# Patient Record
Sex: Female | Born: 1970 | Race: Black or African American | Hispanic: No | Marital: Single | Smoking: Former smoker
Health system: Southern US, Community
[De-identification: ages and names within clinical notes are randomized; demographics above are authoritative.]

## PROBLEM LIST (undated history)

## (undated) DIAGNOSIS — T7840XA Allergy, unspecified, initial encounter: Secondary | ICD-10-CM

## (undated) DIAGNOSIS — Z720 Tobacco use: Secondary | ICD-10-CM

## (undated) DIAGNOSIS — I10 Essential (primary) hypertension: Secondary | ICD-10-CM

## (undated) DIAGNOSIS — D573 Sickle-cell trait: Secondary | ICD-10-CM

## (undated) HISTORY — DX: Allergy, unspecified, initial encounter: T78.40XA

## (undated) HISTORY — DX: Sickle-cell trait: D57.3

## (undated) HISTORY — PX: HERNIA REPAIR: SHX51

## (undated) HISTORY — PX: TUBAL LIGATION: SHX77

---

## 1999-01-15 ENCOUNTER — Encounter: Payer: Self-pay | Admitting: Emergency Medicine

## 1999-01-15 ENCOUNTER — Emergency Department (HOSPITAL_COMMUNITY): Admission: EM | Admit: 1999-01-15 | Discharge: 1999-01-15 | Payer: Self-pay | Admitting: Emergency Medicine

## 1999-02-08 ENCOUNTER — Ambulatory Visit (HOSPITAL_COMMUNITY): Admission: RE | Admit: 1999-02-08 | Discharge: 1999-02-08 | Payer: Self-pay | Admitting: *Deleted

## 1999-04-20 ENCOUNTER — Emergency Department (HOSPITAL_COMMUNITY): Admission: EM | Admit: 1999-04-20 | Discharge: 1999-04-20 | Payer: Self-pay | Admitting: Emergency Medicine

## 1999-06-05 ENCOUNTER — Encounter: Payer: Self-pay | Admitting: *Deleted

## 1999-06-05 ENCOUNTER — Encounter (HOSPITAL_COMMUNITY): Admission: RE | Admit: 1999-06-05 | Discharge: 1999-06-12 | Payer: Self-pay | Admitting: *Deleted

## 1999-06-10 ENCOUNTER — Inpatient Hospital Stay (HOSPITAL_COMMUNITY): Admission: AD | Admit: 1999-06-10 | Discharge: 1999-06-12 | Payer: Self-pay | Admitting: *Deleted

## 1999-07-28 ENCOUNTER — Encounter: Admission: RE | Admit: 1999-07-28 | Discharge: 1999-07-28 | Payer: Self-pay | Admitting: Obstetrics & Gynecology

## 2000-07-05 ENCOUNTER — Emergency Department (HOSPITAL_COMMUNITY): Admission: EM | Admit: 2000-07-05 | Discharge: 2000-07-05 | Payer: Self-pay | Admitting: *Deleted

## 2000-07-08 ENCOUNTER — Emergency Department (HOSPITAL_COMMUNITY): Admission: EM | Admit: 2000-07-08 | Discharge: 2000-07-08 | Payer: Self-pay | Admitting: Emergency Medicine

## 2000-12-03 ENCOUNTER — Emergency Department (HOSPITAL_COMMUNITY): Admission: EM | Admit: 2000-12-03 | Discharge: 2000-12-03 | Payer: Self-pay | Admitting: Emergency Medicine

## 2001-09-10 ENCOUNTER — Emergency Department (HOSPITAL_COMMUNITY): Admission: EM | Admit: 2001-09-10 | Discharge: 2001-09-10 | Payer: Self-pay | Admitting: Emergency Medicine

## 2001-09-10 ENCOUNTER — Encounter: Payer: Self-pay | Admitting: Emergency Medicine

## 2001-11-23 ENCOUNTER — Emergency Department (HOSPITAL_COMMUNITY): Admission: EM | Admit: 2001-11-23 | Discharge: 2001-11-23 | Payer: Self-pay | Admitting: Emergency Medicine

## 2002-01-23 ENCOUNTER — Ambulatory Visit (HOSPITAL_BASED_OUTPATIENT_CLINIC_OR_DEPARTMENT_OTHER): Admission: RE | Admit: 2002-01-23 | Discharge: 2002-01-23 | Payer: Self-pay | Admitting: Obstetrics and Gynecology

## 2003-01-12 ENCOUNTER — Emergency Department (HOSPITAL_COMMUNITY): Admission: EM | Admit: 2003-01-12 | Discharge: 2003-01-12 | Payer: Self-pay | Admitting: Emergency Medicine

## 2003-01-12 ENCOUNTER — Encounter: Payer: Self-pay | Admitting: Emergency Medicine

## 2005-02-02 ENCOUNTER — Ambulatory Visit (HOSPITAL_COMMUNITY): Admission: RE | Admit: 2005-02-02 | Discharge: 2005-02-02 | Payer: Self-pay | Admitting: *Deleted

## 2009-07-09 HISTORY — PX: BREAST BIOPSY: SHX20

## 2009-12-21 ENCOUNTER — Emergency Department (HOSPITAL_COMMUNITY): Admission: EM | Admit: 2009-12-21 | Discharge: 2009-12-21 | Payer: Self-pay | Admitting: Emergency Medicine

## 2010-01-20 ENCOUNTER — Ambulatory Visit (HOSPITAL_COMMUNITY): Admission: RE | Admit: 2010-01-20 | Discharge: 2010-01-20 | Payer: Self-pay | Admitting: General Surgery

## 2010-07-30 ENCOUNTER — Encounter: Payer: Self-pay | Admitting: Internal Medicine

## 2010-09-23 LAB — DIFFERENTIAL
Basophils Absolute: 0 10*3/uL (ref 0.0–0.1)
Eosinophils Absolute: 0.2 10*3/uL (ref 0.0–0.7)
Lymphocytes Relative: 30 % (ref 12–46)
Monocytes Absolute: 0.7 10*3/uL (ref 0.1–1.0)

## 2010-09-23 LAB — CBC
Hemoglobin: 12.6 g/dL (ref 12.0–15.0)
MCHC: 33.4 g/dL (ref 30.0–36.0)
MCV: 82.6 fL (ref 78.0–100.0)

## 2010-09-23 LAB — SURGICAL PCR SCREEN
MRSA, PCR: NEGATIVE
Staphylococcus aureus: POSITIVE — AB

## 2010-11-24 NOTE — Op Note (Signed)
[facility]  Patient:    Yolanda Jennings, Yolanda Jennings Visit Number: [id number] MRN: [id number]          Service Type: NES Location: [facility] Attending Physician:  Michaele Offer Dictated by:   Zenaida Niece, M.D. Proc. Date: 01/23/02 Admit Date:  01/23/2002 Discharge Date: 01/23/2002                             Operative Report  PREOPERATIVE DIAGNOSIS:  Desires surgical sterility.  POSTOPERATIVE DIAGNOSIS:  Desires surgical sterility.  PROCEDURE:  Laparoscopic bilateral tubal fulguration.  SURGEON:  Zenaida Niece, M.D.  ANESTHESIA:  General with an LMA.  ESTIMATED BLOOD LOSS:  Less than 50 cc.  FINDINGS:  Normal female anatomy with a slightly enlarged uterus.  DESCRIPTION OF PROCEDURE:  The patient was taken to the operating room and placed in the dorsal supine position. General anesthesia was induced with an LMA and she was placed in mobile stirrups. The abdomen was prepped and draped in the usual sterile fashion, bladder drained with a red rubber catheter, Hulka tenaculum applied to her cervix for uterine manipulation. Infraumbilical skin was then infiltrated with 0.5% Marcaine. A 1.5 cm horizontal incision was made and the 10/11 disposable trocar was introduced. Placement was confirmed by the laparoscope after CO2 gas was insufflated with an opening pressure of 3 mmHg. Inspection via the operating scope revealed a slightly enlarged but homogeneous uterus. Both tubes and ovaries appeared normal. Both fallopian tubes were identified and traced to their fimbriated ends. Three segments of the middle portion of each tube were desiccated with bipolar cautery until the amp meter read zero. This achieved adequate tubal fulguration. All sites were hemostatic. The scope was removed and all gas allowed to deflate from the abdomen. The umbilical trocar was removed and fascia was reapproximated with a figure-of-eight suture of #0 Vicryl. The  skin was closed with interrupted subcuticular sutures of 4-0 Vicryl followed by Steri-Strips and Band-Aid. The Hulka tenaculum was removed from her cervix. The patient tolerated the procedure well and was extubated in the operating room and taken to the recovery room in stable condition. Dictated by:   Zenaida Niece, M.D. Attending Physician:  Michaele Offer DD:  01/23/02 TD:  01/28/02 Job: [phone] [id number]

## 2011-07-10 HISTORY — PX: HERNIA REPAIR: SHX51

## 2013-03-10 ENCOUNTER — Ambulatory Visit: Payer: Self-pay | Admitting: Certified Nurse Midwife

## 2013-03-10 ENCOUNTER — Encounter: Payer: Self-pay | Admitting: Certified Nurse Midwife

## 2013-08-24 ENCOUNTER — Other Ambulatory Visit: Payer: Self-pay

## 2013-08-24 ENCOUNTER — Emergency Department (HOSPITAL_COMMUNITY)
Admission: EM | Admit: 2013-08-24 | Discharge: 2013-08-24 | Disposition: A | Discharge status: Home / Self Care | Payer: Managed Care, Other (non HMO) | Attending: Emergency Medicine | Admitting: Emergency Medicine

## 2013-08-24 ENCOUNTER — Encounter (HOSPITAL_COMMUNITY): Payer: Self-pay | Admitting: Emergency Medicine

## 2013-08-24 DIAGNOSIS — Z862 Personal history of diseases of the blood and blood-forming organs and certain disorders involving the immune mechanism: Secondary | ICD-10-CM | POA: Insufficient documentation

## 2013-08-24 DIAGNOSIS — I10 Essential (primary) hypertension: Secondary | ICD-10-CM | POA: Insufficient documentation

## 2013-08-24 DIAGNOSIS — Z79899 Other long term (current) drug therapy: Secondary | ICD-10-CM | POA: Insufficient documentation

## 2013-08-24 DIAGNOSIS — H538 Other visual disturbances: Secondary | ICD-10-CM | POA: Insufficient documentation

## 2013-08-24 DIAGNOSIS — F17200 Nicotine dependence, unspecified, uncomplicated: Secondary | ICD-10-CM | POA: Insufficient documentation

## 2013-08-24 LAB — BASIC METABOLIC PANEL
BUN: 9 mg/dL (ref 6–23)
CALCIUM: 8.7 mg/dL (ref 8.4–10.5)
CHLORIDE: 103 meq/L (ref 96–112)
CO2: 23 meq/L (ref 19–32)
Creatinine, Ser: 0.94 mg/dL (ref 0.50–1.10)
GFR calc Af Amer: 85 mL/min — ABNORMAL LOW (ref >90)
GFR, EST NON AFRICAN AMERICAN: 73 mL/min — AB (ref >90)
GLUCOSE: 95 mg/dL (ref 70–99)
POTASSIUM: 3.7 meq/L (ref 3.7–5.3)
SODIUM: 138 meq/L (ref 137–147)

## 2013-08-24 LAB — CBC
HCT: 33.7 % — ABNORMAL LOW (ref 36.0–46.0)
HEMOGLOBIN: 11.5 g/dL — AB (ref 12.0–15.0)
MCH: 24.9 pg — ABNORMAL LOW (ref 26.0–34.0)
MCHC: 34.1 g/dL (ref 30.0–36.0)
MCV: 72.9 fL — AB (ref 78.0–100.0)
PLATELETS: 210 10*3/uL (ref 150–400)
RBC: 4.62 MIL/uL (ref 3.87–5.11)
RDW: 16.5 % — ABNORMAL HIGH (ref 11.5–15.5)
WBC: 6.4 10*3/uL (ref 4.0–10.5)

## 2013-08-24 MED ORDER — LISINOPRIL 10 MG PO TABS
10.0000 mg | ORAL_TABLET | Freq: Once | ORAL | Status: AC
  Administered 2013-08-24: 10 mg via ORAL
  Filled 2013-08-24: qty 1

## 2013-08-24 MED ORDER — LABETALOL HCL 5 MG/ML IV SOLN
20.0000 mg | INTRAVENOUS | Status: DC | PRN
  Administered 2013-08-24: 20 mg via INTRAVENOUS
  Filled 2013-08-24: qty 4

## 2013-08-24 MED ORDER — LISINOPRIL 20 MG PO TABS: 20.0000 mg | ORAL_TABLET | Freq: Every day | ORAL | Status: DC

## 2013-08-24 NOTE — ED Notes (Signed)
MD at bedside. 

## 2013-08-24 NOTE — ED Provider Notes (Addendum)
CSN: [id number]     Arrival date & time 08/24/13  1739 History   First MD Initiated Contact with Patient 08/24/13 1816     Chief Complaint  Patient presents with  . Hypertension      HPI  Pt presents with a complaint of high blood pressure, from her optomitrist's office.  Pt has had diffusely blurry vision from her right eye for the last 2-3 months.  No recent sudden change.  No headache.  Ho weakness or arms or legs.  No difficulty with swallowing or speech.  No Hemianopsia.  No edema.  She goes to urgent care for Primary care, and was last seen over one year ago.  She states that she has been told that her BP is high, but "isnt sure" if she was supposed to be on medications,  Has never been on BP meds.  Past Medical History  Diagnosis Date  . Sickle cell trait    Past Surgical History  Procedure Laterality Date  . Tubal ligation      2003  . Breast biopsy Left 2011   Family History  Problem Relation Age of Onset  . Cancer Mother     lung & cervical  . Hypertension Mother    History  Substance Use Topics  . Smoking status: Current Every Day Smoker  . Smokeless tobacco: Not on file  . Alcohol Use: Yes   OB History   Grav Para Term Preterm Abortions TAB SAB Ect Mult Living   2 2 2       2      Review of Systems  Constitutional: Negative for fever, chills, diaphoresis, appetite change and fatigue.  HENT: Negative for mouth sores, sore throat and trouble swallowing.   Eyes: Positive for visual disturbance.  Respiratory: Negative for cough, chest tightness, shortness of breath and wheezing.   Cardiovascular: Negative for chest pain.  Gastrointestinal: Negative for nausea, vomiting, abdominal pain, diarrhea and abdominal distention.  Endocrine: Negative for polydipsia, polyphagia and polyuria.  Genitourinary: Negative for dysuria, frequency and hematuria.  Musculoskeletal: Negative for gait problem.  Skin: Negative for color change, pallor and rash.  Neurological:  Negative for dizziness, syncope, light-headedness and headaches.  Hematological: Does not bruise/bleed easily.  Psychiatric/Behavioral: Negative for behavioral problems and confusion.      Allergies  Review of patient's allergies indicates no known allergies.  Home Medications   Current Outpatient Rx  Name  Route  Sig  Dispense  Refill  . ibuprofen (ADVIL,MOTRIN) 200 MG tablet   Oral   Take 400 mg by mouth every 6 (six) hours as needed for moderate pain.         Marland Kitchen lisinopril (PRINIVIL,ZESTRIL) 20 MG tablet   Oral   Take 1 tablet (20 mg total) by mouth daily.   30 tablet   0    BP 160/85  Pulse 104  Temp(Src) 98.4 F (36.9 C) (Oral)  Resp 17  Ht 5\' 5"  (1.651 m)  Wt 180 lb (81.647 kg)  BMI 29.95 kg/m2  SpO2 96%  LMP 07/16/2013 Physical Exam  Constitutional: She is oriented to person, place, and time. She appears well-developed and well-nourished. No distress.  HENT:  Head: Normocephalic.  Eyes: Conjunctivae are normal. Pupils are equal, round, and reactive to light.  Eyes are dilated from her optometry visit. I do not see obvious hemorrhages or exudates. There is subtle  Nicking bilaterally.   On visual acuity testing there are no visual field cuts. No hemianopsia. No left eye  visual complaints.  Bilateral fundi appear normal without papilledema.    Neck: Normal range of motion. Neck supple. No thyromegaly present.  Cardiovascular: Normal rate and regular rhythm.  Exam reveals no gallop and no friction rub.   No murmur heard. Pulmonary/Chest: Effort normal and breath sounds normal. No respiratory distress. She has no wheezes. She has no rales.  Abdominal: Soft. Bowel sounds are normal. She exhibits no distension. There is no tenderness. There is no rebound.  Musculoskeletal: Normal range of motion.  Neurological: She is alert and oriented to person, place, and time.  Skin: Skin is warm and dry. No rash noted.  Psychiatric: She has a normal mood and affect. Her  behavior is normal.    ED Course  Procedures (including critical care time) Labs Review Labs Reviewed  CBC - Abnormal; Notable for the following:    Hemoglobin 11.5 (*)    HCT 33.7 (*)    MCV 72.9 (*)    MCH 24.9 (*)    RDW 16.5 (*)    All other components within normal limits  BASIC METABOLIC PANEL - Abnormal; Notable for the following:    GFR calc non Af Amer 73 (*)    GFR calc Af Amer 85 (*)    All other components within normal limits   Imaging Review No results found.  EKG Interpretation   None       MDM   Final diagnoses:  Hypertension    EKG shows LVH. Renal function is normal. After one dose of labetalol 20 mg IV, and 20 mg lisinopril by mouth her blood pressures are much improved 160/79 and 179/89. Remains asymptomatic.   Assessment: Hypertension. Do not feel that her vision changes are consistent with hypertensive encephalopathy sulfa hemorrhagic retinopathy or CVA. This is diffuse he lateral mild blurry vision without visual field cut. Had hemianopsia. Without gross abnormal redness of the AV nicking. I think blood pressure control is all that is needed. I have asked her to watch her weight, limit salt, stop smoking. Primary care followup.    Tanna Furry, MD 08/24/13 [place], MD 08/24/13 2225

## 2013-08-24 NOTE — ED Notes (Signed)
Initial Contact - pt from triage with family at bs, pt reports was seen by eye doctor today for blurry vision to R eye x1 mo.  Per eye doctor "something was wrong with my blood vessels", BP checked was very high.  Pt denies other complaints.  Neuros grossly intact.  Skin PWD.  MAEI.  Pt is otherwise well appearing.  Changed to hospital gown, placed to cardiac/02 monitor, NAD.  Awaiting EDP eval.

## 2013-08-24 NOTE — ED Notes (Signed)
Per pt, was having eye exam and they checked BP, was sent here for eval

## 2013-08-24 NOTE — ED Notes (Signed)
Patient asking if she may eat Per Dr. Jeneen Rinks, [place] for patient to eat Patient in NAD

## 2013-08-24 NOTE — Discharge Instructions (Signed)
Hypertension Hypertension is another name for high blood pressure. High blood pressure may mean that your heart needs to work harder to pump blood. Blood pressure consists of two numbers, which includes a higher number over a lower number (example: 110/72). HOME CARE   Make lifestyle changes as told by your doctor. This may include weight loss and exercise.  Take your blood pressure medicine every day.  Limit how much salt you use.  Stop smoking if you smoke.  Do not use drugs.  Talk to your doctor if you are using decongestants or birth control pills. These medicines might make blood pressure higher.  Females should not drink more than 1 alcoholic drink per day. Males should not drink more than 2 alcoholic drinks per day.  See your doctor as told. GET HELP RIGHT AWAY IF:   You have a blood pressure reading with a top number of 180 or higher.  You get a very bad headache.  You get blurred or changing vision.  You feel confused.  You feel weak, numb, or faint.  You get chest or belly (abdominal) pain.  You throw up (vomit).  You cannot breathe very well. MAKE SURE YOU:   Understand these instructions.  Will watch your condition.  Will get help right away if you are not doing well or get worse. Document Released: 12/12/2007 Document Revised: 09/17/2011 Document Reviewed: 12/12/2007 [employer] Patient Information 2014 [employer].  

## 2013-08-24 NOTE — ED Notes (Signed)
BP normalizing s/p admin of medications, see MAR Patient remains asymptomatic  Patient denies complaints or further needs at this time Side rails up, call bel in reach

## 2013-08-26 ENCOUNTER — Encounter (HOSPITAL_COMMUNITY): Payer: Self-pay | Admitting: Emergency Medicine

## 2013-08-26 DIAGNOSIS — F17200 Nicotine dependence, unspecified, uncomplicated: Secondary | ICD-10-CM | POA: Insufficient documentation

## 2013-08-26 DIAGNOSIS — I10 Essential (primary) hypertension: Secondary | ICD-10-CM | POA: Insufficient documentation

## 2013-08-26 DIAGNOSIS — Z862 Personal history of diseases of the blood and blood-forming organs and certain disorders involving the immune mechanism: Secondary | ICD-10-CM | POA: Insufficient documentation

## 2013-08-26 DIAGNOSIS — Z79899 Other long term (current) drug therapy: Secondary | ICD-10-CM | POA: Insufficient documentation

## 2013-08-26 NOTE — ED Notes (Signed)
Pt states she is here due to her HTN. Was seen at [facility]. Recently prescribed Lisinopril. She states that her feet are starting to swell. Swelling decreases with walking and increases with laying down.

## 2013-08-27 ENCOUNTER — Emergency Department (HOSPITAL_COMMUNITY)
Admission: EM | Admit: 2013-08-27 | Discharge: 2013-08-27 | Disposition: A | Discharge status: Home / Self Care | Payer: Managed Care, Other (non HMO) | Attending: Emergency Medicine | Admitting: Emergency Medicine

## 2013-08-27 ENCOUNTER — Emergency Department (HOSPITAL_COMMUNITY): Payer: Managed Care, Other (non HMO)

## 2013-08-27 DIAGNOSIS — I10 Essential (primary) hypertension: Secondary | ICD-10-CM

## 2013-08-27 HISTORY — DX: Essential (primary) hypertension: I10

## 2013-08-27 LAB — POCT I-STAT, CHEM 8
BUN: 6 mg/dL (ref 6–23)
CALCIUM ION: 1.19 mmol/L (ref 1.12–1.23)
Chloride: 114 mEq/L — ABNORMAL HIGH (ref 96–112)
Creatinine, Ser: 1.1 mg/dL (ref 0.50–1.10)
Glucose, Bld: 117 mg/dL — ABNORMAL HIGH (ref 70–99)
HEMATOCRIT: 37 % (ref 36.0–46.0)
Hemoglobin: 12.6 g/dL (ref 12.0–15.0)
Potassium: 3.1 mEq/L — ABNORMAL LOW (ref 3.7–5.3)
Sodium: 140 mEq/L (ref 137–147)
TCO2: 25 mmol/L (ref 0–100)

## 2013-08-27 LAB — CBC WITH DIFFERENTIAL/PLATELET
BASOS PCT: 0 % (ref 0–1)
Basophils Absolute: 0 10*3/uL (ref 0.0–0.1)
Eosinophils Absolute: 0.1 10*3/uL (ref 0.0–0.7)
Eosinophils Relative: 2 % (ref 0–5)
HEMATOCRIT: 33.5 % — AB (ref 36.0–46.0)
Hemoglobin: 11.2 g/dL — ABNORMAL LOW (ref 12.0–15.0)
LYMPHS PCT: 24 % (ref 12–46)
Lymphs Abs: 1.6 10*3/uL (ref 0.7–4.0)
MCH: 25 pg — ABNORMAL LOW (ref 26.0–34.0)
MCHC: 33.4 g/dL (ref 30.0–36.0)
MCV: 74.8 fL — AB (ref 78.0–100.0)
MONO ABS: 0.7 10*3/uL (ref 0.1–1.0)
MONOS PCT: 10 % (ref 3–12)
NEUTROS PCT: 65 % (ref 43–77)
Neutro Abs: 4.5 10*3/uL (ref 1.7–7.7)
PLATELETS: 235 10*3/uL (ref 150–400)
RBC: 4.48 MIL/uL (ref 3.87–5.11)
RDW: 16.7 % — AB (ref 11.5–15.5)
WBC: 6.9 10*3/uL (ref 4.0–10.5)

## 2013-08-27 MED ORDER — AMLODIPINE BESYLATE 10 MG PO TABS
10.0000 mg | ORAL_TABLET | Freq: Once | ORAL | Status: AC
  Administered 2013-08-27: 10 mg via ORAL
  Filled 2013-08-27: qty 1

## 2013-08-27 NOTE — ED Provider Notes (Signed)
CSN: [id number]     Arrival date & time 08/26/13  2300 History   First MD Initiated Contact with Patient 08/27/13 0214     Chief Complaint  Patient presents with  . Hypertension     (Consider location/radiation/quality/duration/timing/severity/associated sxs/prior Treatment) Patient is a 43 y.o. female presenting with hypertension. The history is provided by the patient.  Hypertension This is a new problem. The current episode started more than 1 week ago. The problem occurs constantly. The problem has not changed since onset.Pertinent negatives include no chest pain, no abdominal pain, no headaches and no shortness of breath. Nothing aggravates the symptoms. Nothing relieves the symptoms. She has tried nothing for the symptoms. The treatment provided no relief.    Past Medical History  Diagnosis Date  . Sickle cell trait   . Hypertension    Past Surgical History  Procedure Laterality Date  . Tubal ligation      2003  . Breast biopsy Left 2011   Family History  Problem Relation Age of Onset  . Cancer Mother     lung & cervical  . Hypertension Mother    History  Substance Use Topics  . Smoking status: Current Every Day Smoker  . Smokeless tobacco: Not on file  . Alcohol Use: Yes   OB History   Grav Para Term Preterm Abortions TAB SAB Ect Mult Living   2 2 2       2      Review of Systems  Constitutional: Negative for fever.  Respiratory: Negative for chest tightness and shortness of breath.   Cardiovascular: Negative for chest pain, palpitations and leg swelling.  Gastrointestinal: Negative for abdominal pain.  Neurological: Negative for headaches.  All other systems reviewed and are negative.      Allergies  Review of patient's allergies indicates no known allergies.  Home Medications   Current Outpatient Rx  Name  Route  Sig  Dispense  Refill  . ibuprofen (ADVIL,MOTRIN) 200 MG tablet   Oral   Take 400 mg by mouth every 4 (four) hours as needed for  moderate pain.          Marland Kitchen lisinopril (PRINIVIL,ZESTRIL) 20 MG tablet   Oral   Take 1 tablet (20 mg total) by mouth daily.   30 tablet   0    BP 174/101  Pulse 77  Temp(Src) 98.3 F (36.8 C) (Oral)  Resp 14  SpO2 100%  LMP 07/16/2013 Physical Exam  Constitutional: She is oriented to person, place, and time. She appears well-developed and well-nourished. No distress.  HENT:  Head: Normocephalic and atraumatic.  Mouth/Throat: Oropharynx is clear and moist.  Eyes: Conjunctivae are normal. Pupils are equal, round, and reactive to light.  Neck: Normal range of motion. Neck supple.  Cardiovascular: Normal rate, regular rhythm and intact distal pulses.   Pulmonary/Chest: Effort normal and breath sounds normal.  Abdominal: Soft. Bowel sounds are normal. There is no tenderness. There is no rebound and no guarding.  Musculoskeletal: Normal range of motion.  Neurological: She is alert and oriented to person, place, and time.  Skin: Skin is warm and dry.  Psychiatric: She has a normal mood and affect.    ED Course  Procedures (including critical care time) Labs Review Labs Reviewed  CBC WITH DIFFERENTIAL - Abnormal; Notable for the following:    Hemoglobin 11.2 (*)    HCT 33.5 (*)    MCV 74.8 (*)    MCH 25.0 (*)    RDW 16.7 (*)  All other components within normal limits  POCT I-STAT, CHEM 8 - Abnormal; Notable for the following:    Potassium 3.1 (*)    Chloride 114 (*)    Glucose, Bld 117 (*)    All other components within normal limits   Imaging Review Dg Chest 2 View  08/27/2013   CLINICAL DATA:  Hypertension.  EXAM: CHEST  2 VIEW  COMPARISON:  12/21/2009  FINDINGS: Mild cardiomegaly. No confluent airspace opacities in the lungs. No effusions. No acute bony abnormality.  IMPRESSION: Cardiomegaly.  No active disease.   Electronically Signed   By: Rolm Baptise M.D.   On: 08/27/2013 02:26    EKG Interpretation    Date/Time:  Thursday August 27 2013 03:00:15  EST Ventricular Rate:  88 PR Interval:  176 QRS Duration: 84 QT Interval:  381 QTC Calculation: 461 R Axis:   33 Text Interpretation:  Sinus rhythm Probable left atrial enlargement LVH with secondary repolarization abnormality Confirmed by [facility]  MD,  ([id number]) on 08/27/2013 3:44:42 AM            MDM   Final diagnoses:  None   No neuro symptoms no Cp no SOB.  Safe for discharge follow up with your family doctor for ongoing care Patient is ingesting huge quantities of sodium and we have have a long discussion about diet and lifestyle modification and follow up.      Carlisle Beers, MD 08/27/13 [phone]

## 2013-08-27 NOTE — Discharge Instructions (Signed)
DASH Diet The DASH diet stands for "Dietary Approaches to Stop Hypertension." It is a healthy eating plan that has been shown to reduce high blood pressure (hypertension) in as little as 14 days, while also possibly providing other significant health benefits. These other health benefits include reducing the risk of breast cancer after menopause and reducing the risk of type 2 diabetes, heart disease, colon cancer, and stroke. Health benefits also include weight loss and slowing kidney failure in patients with chronic kidney disease.  DIET GUIDELINES  Limit salt (sodium). Your diet should contain less than 1500 mg of sodium daily.  Limit refined or processed carbohydrates. Your diet should include mostly whole grains. Desserts and added sugars should be used sparingly.  Include small amounts of heart-healthy fats. These types of fats include nuts, oils, and tub margarine. Limit saturated and trans fats. These fats have been shown to be harmful in the body. CHOOSING FOODS  The following food groups are based on a 2000 calorie diet. See your Registered Dietitian for individual calorie needs. Grains and Grain Products (6 to 8 servings daily)  Eat More Often: Whole-wheat bread, brown rice, whole-grain or wheat pasta, quinoa, popcorn without added fat or salt (air popped).  Eat Less Often: White bread, white pasta, white rice, cornbread. Vegetables (4 to 5 servings daily)  Eat More Often: Fresh, frozen, and canned vegetables. Vegetables may be raw, steamed, roasted, or grilled with a minimal amount of fat.  Eat Less Often/Avoid: Creamed or fried vegetables. Vegetables in a cheese sauce. Fruit (4 to 5 servings daily)  Eat More Often: All fresh, canned (in natural juice), or frozen fruits. Dried fruits without added sugar. One hundred percent fruit juice ( cup [237 mL] daily).  Eat Less Often: Dried fruits with added sugar. Canned fruit in light or heavy syrup. [employer], Fish, and Poultry (2  servings or less daily. One serving is 3 to 4 oz [85-114 g]).  Eat More Often: Ninety percent or leaner ground beef, tenderloin, sirloin. Round cuts of beef, chicken breast, [place] breast. All fish. Grill, bake, or broil your meat. Nothing should be fried.  Eat Less Often/Avoid: Fatty cuts of meat, [place], or chicken leg, thigh, or wing. Fried cuts of meat or fish. Dairy (2 to 3 servings)  Eat More Often: Low-fat or fat-free milk, low-fat plain or light yogurt, reduced-fat or part-skim cheese.  Eat Less Often/Avoid: Milk (whole, 2%).Whole milk yogurt. Full-fat cheeses. Nuts, Seeds, and Legumes (4 to 5 servings per week)  Eat More Often: All without added salt.  Eat Less Often/Avoid: Salted nuts and seeds, canned beans with added salt. Fats and Sweets (limited)  Eat More Often: Vegetable oils, tub margarines without trans fats, sugar-free gelatin. Mayonnaise and salad dressings.  Eat Less Often/Avoid: Coconut oils, palm oils, butter, stick margarine, cream, half and half, cookies, candy, pie. FOR MORE INFORMATION The Dash Diet Eating Plan: www.dashdiet.org Document Released: 06/14/2011 Document Revised: 09/17/2011 Document Reviewed: 06/14/2011 [facility] Patient Information 2014 [place], [place].  How to Take Your Blood Pressure  These instructions are only for electronic home blood pressure machines. You will need:   An automatic or semi-automatic blood pressure machine.  Fresh batteries for the blood pressure machine. HOW DO I USE THESE TOOLS TO CHECK MY BLOOD PRESSURE?   There are 2 numbers that make up your blood pressure. For example: 120/80.  The first number (120 in our example) is called the "systolic pressure." It is a measure of the pressure in your blood vessels  when your heart is pumping blood.  The second number (80 in our example) is called the "diastolic pressure." It is a measure of the pressure in your blood vessels when your heart is resting between  beats.  Before you buy a home blood pressure machine, check the size of your arm so you can buy the right size cuff. Here is how to check the size of your arm:  Use a tape measure that shows both inches and centimeters.  Wrap the tape measure around the middle upper part of your arm. You may need someone to help you measure right.  Write down your arm measurement in both inches and centimeters.  To measure your blood pressure right, it is important to have the right size cuff.  If your arm is up to 13 inches (37 to 34 centimeters), get an adult cuff size.  If your arm is 13 to 17 inches (35 to 44 centimeters), get a large adult cuff size.  If your arm is 17 to 20 inches (45 to 52 centimeters), get an adult thigh cuff.  Try to rest or relax for at least 30 minutes before you check your blood pressure.  Do not smoke.  Do not have any drinks with caffeine, such as:  Pop.  Coffee.  Tea.  Check your blood pressure in a quiet room.  Sit down and stretch out your arm on a table. Keep your arm at about the level of your heart. Let your arm relax. GETTING BLOOD PRESSURE READINGS  Make sure you remove any tight-fighting clothing from your arm. Wrap the cuff around your upper arm. Wrap it just above the bend, and above where you felt the pulse. You should be able to slip a finger between the cuff and your arm. If you cannot slip a finger in the cuff, it is too tight and should be removed and rewrapped.  Some units requires you to manually pump up the arm cuff.  Automatic units inflate the cuff when you press a button.  Cuff deflation is automatic in both models.  After the cuff is inflated, the unit measures your blood pressure and pulse. The readings are displayed on a monitor. Hold still and breathe normally while the cuff is inflated.  Getting a reading takes less than a minute.  Some models store readings in a memory. Some provide a printout of readings.  Get readings at  different times of the day. You should wait at least 5 minutes between readings. Take readings with you to your next doctor's visit. Document Released: 06/07/2008 Document Revised: 09/17/2011 Document Reviewed: 06/07/2008 [facility] Patient Information 2014 [place].

## 2013-08-28 ENCOUNTER — Encounter (HOSPITAL_COMMUNITY): Payer: Self-pay | Admitting: Emergency Medicine

## 2013-08-28 ENCOUNTER — Ambulatory Visit (INDEPENDENT_AMBULATORY_CARE_PROVIDER_SITE_OTHER): Payer: Managed Care, Other (non HMO) | Admitting: Family Medicine

## 2013-08-28 ENCOUNTER — Inpatient Hospital Stay (HOSPITAL_COMMUNITY)
Admission: EM | Admit: 2013-08-28 | Discharge: 2013-09-03 | DRG: 916 | Disposition: A | Discharge status: Home / Self Care | Payer: Managed Care, Other (non HMO) | Attending: Internal Medicine | Admitting: Internal Medicine

## 2013-08-28 VITALS — BP 220/140 | HR 84 | Temp 98.9°F | Resp 18 | Ht 66.0 in | Wt 180.0 lb

## 2013-08-28 DIAGNOSIS — Z79899 Other long term (current) drug therapy: Secondary | ICD-10-CM

## 2013-08-28 DIAGNOSIS — R519 Headache, unspecified: Secondary | ICD-10-CM

## 2013-08-28 DIAGNOSIS — T46905A Adverse effect of unspecified agents primarily affecting the cardiovascular system, initial encounter: Secondary | ICD-10-CM | POA: Diagnosis present

## 2013-08-28 DIAGNOSIS — I10 Essential (primary) hypertension: Secondary | ICD-10-CM

## 2013-08-28 DIAGNOSIS — Z791 Long term (current) use of non-steroidal anti-inflammatories (NSAID): Secondary | ICD-10-CM

## 2013-08-28 DIAGNOSIS — T7840XA Allergy, unspecified, initial encounter: Secondary | ICD-10-CM

## 2013-08-28 DIAGNOSIS — R51 Headache: Secondary | ICD-10-CM | POA: Diagnosis present

## 2013-08-28 DIAGNOSIS — T783XXA Angioneurotic edema, initial encounter: Secondary | ICD-10-CM | POA: Diagnosis present

## 2013-08-28 DIAGNOSIS — T464X5A Adverse effect of angiotensin-converting-enzyme inhibitors, initial encounter: Secondary | ICD-10-CM

## 2013-08-28 DIAGNOSIS — Z888 Allergy status to other drugs, medicaments and biological substances status: Secondary | ICD-10-CM

## 2013-08-28 DIAGNOSIS — D573 Sickle-cell trait: Secondary | ICD-10-CM | POA: Diagnosis present

## 2013-08-28 DIAGNOSIS — Z8249 Family history of ischemic heart disease and other diseases of the circulatory system: Secondary | ICD-10-CM

## 2013-08-28 DIAGNOSIS — F17200 Nicotine dependence, unspecified, uncomplicated: Secondary | ICD-10-CM | POA: Diagnosis present

## 2013-08-28 DIAGNOSIS — E876 Hypokalemia: Secondary | ICD-10-CM | POA: Diagnosis present

## 2013-08-28 DIAGNOSIS — I160 Hypertensive urgency: Secondary | ICD-10-CM | POA: Diagnosis present

## 2013-08-28 DIAGNOSIS — D509 Iron deficiency anemia, unspecified: Secondary | ICD-10-CM

## 2013-08-28 DIAGNOSIS — J029 Acute pharyngitis, unspecified: Secondary | ICD-10-CM

## 2013-08-28 DIAGNOSIS — N179 Acute kidney failure, unspecified: Secondary | ICD-10-CM | POA: Diagnosis present

## 2013-08-28 HISTORY — DX: Tobacco use: Z72.0

## 2013-08-28 LAB — CBC WITH DIFFERENTIAL/PLATELET
BASOS PCT: 0 % (ref 0–1)
Basophils Absolute: 0 10*3/uL (ref 0.0–0.1)
EOS ABS: 0 10*3/uL (ref 0.0–0.7)
Eosinophils Relative: 0 % (ref 0–5)
HCT: 37 % (ref 36.0–46.0)
HEMOGLOBIN: 12.6 g/dL (ref 12.0–15.0)
Lymphocytes Relative: 9 % — ABNORMAL LOW (ref 12–46)
Lymphs Abs: 0.8 10*3/uL (ref 0.7–4.0)
MCH: 25.1 pg — AB (ref 26.0–34.0)
MCHC: 34.1 g/dL (ref 30.0–36.0)
MCV: 73.9 fL — ABNORMAL LOW (ref 78.0–100.0)
MONOS PCT: 2 % — AB (ref 3–12)
Monocytes Absolute: 0.2 10*3/uL (ref 0.1–1.0)
NEUTROS PCT: 89 % — AB (ref 43–77)
Neutro Abs: 8.5 10*3/uL — ABNORMAL HIGH (ref 1.7–7.7)
PLATELETS: 244 10*3/uL (ref 150–400)
RBC: 5.01 MIL/uL (ref 3.87–5.11)
RDW: 16.6 % — ABNORMAL HIGH (ref 11.5–15.5)
WBC: 9.5 10*3/uL (ref 4.0–10.5)

## 2013-08-28 LAB — I-STAT CHEM 8, ED
BUN: 7 mg/dL (ref 6–23)
Calcium, Ion: 1.15 mmol/L (ref 1.12–1.23)
Chloride: 107 meq/L (ref 96–112)
Creatinine, Ser: 1 mg/dL (ref 0.50–1.10)
Glucose, Bld: 88 mg/dL (ref 70–99)
HCT: 42 % (ref 36.0–46.0)
Hemoglobin: 14.3 g/dL (ref 12.0–15.0)
Potassium: 3.8 meq/L (ref 3.7–5.3)
Sodium: 142 meq/L (ref 137–147)
TCO2: 20 mmol/L (ref 0–100)

## 2013-08-28 LAB — COMPREHENSIVE METABOLIC PANEL
ALBUMIN: 4 g/dL (ref 3.5–5.2)
ALK PHOS: 101 U/L (ref 39–117)
ALT: 7 U/L (ref 0–35)
AST: 18 U/L (ref 0–37)
BUN: 8 mg/dL (ref 6–23)
CALCIUM: 9.2 mg/dL (ref 8.4–10.5)
CO2: 20 mEq/L (ref 19–32)
Chloride: 104 mEq/L (ref 96–112)
Creatinine, Ser: 0.95 mg/dL (ref 0.50–1.10)
GFR calc non Af Amer: 72 mL/min — ABNORMAL LOW (ref >90)
GFR, EST AFRICAN AMERICAN: 84 mL/min — AB (ref >90)
GLUCOSE: 89 mg/dL (ref 70–99)
POTASSIUM: 4.1 meq/L (ref 3.7–5.3)
SODIUM: 139 meq/L (ref 137–147)
TOTAL PROTEIN: 8.1 g/dL (ref 6.0–8.3)
Total Bilirubin: 0.5 mg/dL (ref 0.3–1.2)

## 2013-08-28 LAB — APTT: APTT: 36 s (ref 24–37)

## 2013-08-28 LAB — MAGNESIUM: Magnesium: 1.9 mg/dL (ref 1.5–2.5)

## 2013-08-28 LAB — PROTIME-INR
INR: 0.95 (ref 0.00–1.49)
PROTHROMBIN TIME: 12.5 s (ref 11.6–15.2)

## 2013-08-28 LAB — PHOSPHORUS: Phosphorus: 2.1 mg/dL — ABNORMAL LOW (ref 2.3–4.6)

## 2013-08-28 LAB — MRSA PCR SCREENING: MRSA by PCR: NEGATIVE

## 2013-08-28 MED ORDER — ACETAMINOPHEN 325 MG PO TABS
650.0000 mg | ORAL_TABLET | Freq: Four times a day (QID) | ORAL | Status: DC | PRN
  Administered 2013-08-28 – 2013-09-02 (×4): 650 mg via ORAL
  Filled 2013-08-28 (×5): qty 2

## 2013-08-28 MED ORDER — DIPHENHYDRAMINE HCL 50 MG/ML IJ SOLN: 25.0000 mg | Freq: Four times a day (QID) | INTRAMUSCULAR | Status: DC | PRN

## 2013-08-28 MED ORDER — HYDRALAZINE HCL 20 MG/ML IJ SOLN
10.0000 mg | Freq: Four times a day (QID) | INTRAMUSCULAR | Status: DC | PRN
Start: 2013-08-28 — End: 2013-08-28
  Administered 2013-08-28: 10 mg via INTRAVENOUS
  Filled 2013-08-28: qty 0.5

## 2013-08-28 MED ORDER — FAMOTIDINE 20 MG PO TABS
20.0000 mg | ORAL_TABLET | Freq: Every day | ORAL | Status: DC
  Administered 2013-08-28 – 2013-09-02 (×6): 20 mg via ORAL
  Filled 2013-08-28 (×6): qty 1

## 2013-08-28 MED ORDER — ONDANSETRON HCL 4 MG/2ML IJ SOLN: 4.0000 mg | Freq: Four times a day (QID) | INTRAMUSCULAR | Status: DC | PRN

## 2013-08-28 MED ORDER — ONDANSETRON HCL 4 MG PO TABS: 4.0000 mg | ORAL_TABLET | Freq: Four times a day (QID) | ORAL | Status: DC | PRN

## 2013-08-28 MED ORDER — FAMOTIDINE IN NACL 20-0.9 MG/50ML-% IV SOLN
20.0000 mg | Freq: Once | INTRAVENOUS | Status: AC
  Administered 2013-08-28: 20 mg via INTRAVENOUS
  Filled 2013-08-28: qty 50

## 2013-08-28 MED ORDER — MORPHINE SULFATE 2 MG/ML IJ SOLN
1.0000 mg | INTRAMUSCULAR | Status: DC | PRN
  Administered 2013-08-31 (×2): 1 mg via INTRAVENOUS
  Filled 2013-08-28 (×2): qty 1

## 2013-08-28 MED ORDER — METHYLPREDNISOLONE SODIUM SUCC 125 MG IJ SOLR
125.0000 mg | Freq: Once | INTRAMUSCULAR | Status: AC
  Administered 2013-08-28: 125 mg via INTRAVENOUS
  Filled 2013-08-28: qty 2

## 2013-08-28 MED ORDER — HYDROCHLOROTHIAZIDE 25 MG PO TABS: 25.0000 mg | ORAL_TABLET | Freq: Every day | ORAL | Status: DC

## 2013-08-28 MED ORDER — METHYLPREDNISOLONE SODIUM SUCC 125 MG IJ SOLR
60.0000 mg | Freq: Two times a day (BID) | INTRAMUSCULAR | Status: DC
  Administered 2013-08-28 – 2013-08-30 (×4): 60 mg via INTRAVENOUS
  Filled 2013-08-28 (×4): qty 0.96
  Filled 2013-08-28: qty 2
  Filled 2013-08-28: qty 0.96

## 2013-08-28 MED ORDER — SODIUM CHLORIDE 0.9 % IV SOLN: INTRAVENOUS | Status: DC

## 2013-08-28 MED ORDER — HYDRALAZINE HCL 10 MG PO TABS
10.0000 mg | ORAL_TABLET | Freq: Four times a day (QID) | ORAL | Status: DC
  Administered 2013-08-28 – 2013-08-29 (×4): 10 mg via ORAL
  Filled 2013-08-28 (×8): qty 1

## 2013-08-28 MED ORDER — DIPHENHYDRAMINE HCL 50 MG/ML IJ SOLN
25.0000 mg | Freq: Once | INTRAMUSCULAR | Status: AC
  Administered 2013-08-28: 25 mg via INTRAVENOUS
  Filled 2013-08-28: qty 1

## 2013-08-28 MED ORDER — DIPHENHYDRAMINE HCL 50 MG/ML IJ SOLN
50.0000 mg | Freq: Once | INTRAMUSCULAR | Status: AC
  Administered 2013-08-28: 50 mg via INTRAMUSCULAR

## 2013-08-28 MED ORDER — SODIUM CHLORIDE 0.9 % IJ SOLN
3.0000 mL | Freq: Two times a day (BID) | INTRAMUSCULAR | Status: DC
  Administered 2013-08-28 – 2013-09-02 (×9): 3 mL via INTRAVENOUS

## 2013-08-28 MED ORDER — ACETAMINOPHEN 650 MG RE SUPP: 650.0000 mg | Freq: Four times a day (QID) | RECTAL | Status: DC | PRN

## 2013-08-28 MED ORDER — HYDROCODONE-ACETAMINOPHEN 5-325 MG PO TABS
1.0000 | ORAL_TABLET | ORAL | Status: DC | PRN
  Administered 2013-08-29 – 2013-08-30 (×3): 1 via ORAL
  Administered 2013-08-31 – 2013-09-01 (×5): 2 via ORAL
  Filled 2013-08-28 (×5): qty 2
  Filled 2013-08-28 (×2): qty 1
  Filled 2013-08-28: qty 2
  Filled 2013-08-28: qty 1

## 2013-08-28 MED ORDER — HYDRALAZINE HCL 20 MG/ML IJ SOLN
10.0000 mg | Freq: Four times a day (QID) | INTRAMUSCULAR | Status: DC | PRN
  Administered 2013-08-28 – 2013-08-31 (×5): 20 mg via INTRAVENOUS
  Filled 2013-08-28 (×6): qty 1

## 2013-08-28 NOTE — ED Notes (Signed)
Bed: YF74 Expected date:  Expected time:  Means of arrival:  Comments: EMS-angioedema/allergic reaction to lisinopril

## 2013-08-28 NOTE — Progress Notes (Addendum)
This chart was scribed for [name], MD by Ludger Nutting, ED Scribe. This patient was seen in room 5 and the patient's care was started 11:44 AM.  Subjective:    Patient ID: Yolanda Jennings, female    DOB: May 24, 1971, 43 y.o.   MRN: [id number]  HPI  HPI Comments:  Pt was seen at [facility] ED on 08/24/13 due to elevated BP reading of 225/118 at optometrist office without known history of HTN. BP 160/85 in ED.  Labs notable for GFR 85 as well EKG showing LVH. Was treated with labetalol 20 mg IV and 20 mg lisinopril PO. Discharge BP of 160/79 and 179/89. Seen again yesterday in the ED with BP 174/101. CXR showed cardiomegaly but no active disease. Repeat EKG showed sinus rhythm LVH and repolarization abnormality. No apparent change in medicines except to decrease amount of sodium in diet.   Yolanda Jennings is a 43 y.o. female who presents to [facility] for a follow up visit. She is here to set up primary care to monitor her HTN. Pt states it was 225/118 at the optometrist. She states she returned to the ED for another episode of elevated BP and leg swelling. She states the leg swelling has resolved but she is having mild HA's. She reports having a scratchy throat for the past few days. Pt states she has nearly eliminated sodium from her diet since being diagnosed with HTN. She reports being compliant with the lisinopril. She denies current leg swelling, chest pain, fever, rhinorrhea, cough, postnasal drip, visual disturbances, extremity weakness or numbness, slurred speech.    There are no active problems to display for this patient.  Past Medical History  Diagnosis Date  . Sickle cell trait   . Hypertension    Past Surgical History  Procedure Laterality Date  . Tubal ligation      2003  . Breast biopsy Left 2011   No Known Allergies Prior to Admission medications   Medication Sig Start Date End Date Taking? Authorizing Provider  lisinopril (PRINIVIL,ZESTRIL) 20 MG tablet Take 1 tablet (20  mg total) by mouth daily. 08/24/13  Yes Tanna Furry, MD  ibuprofen (ADVIL,MOTRIN) 200 MG tablet Take 400 mg by mouth every 4 (four) hours as needed for moderate pain.     Historical Provider, MD   History   Social History  . Marital Status: Single    Spouse Name: N/A    Number of Children: N/A  . Years of Education: N/A   Occupational History  . Not on file.   Social History Main Topics  . Smoking status: Current Every Day Smoker  . Smokeless tobacco: Not on file  . Alcohol Use: Yes  . Drug Use: Not on file  . Sexual Activity: Not on file   Other Topics Concern  . Not on file   Social History Narrative  . No narrative on file     Review of Systems  Constitutional: Negative for fatigue and unexpected weight change.  HENT: Positive for sore throat. Negative for postnasal drip, rhinorrhea and trouble swallowing.   Eyes: Negative for visual disturbance.  Respiratory: Negative for cough, chest tightness and shortness of breath.   Cardiovascular: Negative for chest pain, palpitations and leg swelling.  Gastrointestinal: Negative for abdominal pain and blood in stool.  Neurological: Positive for headaches. Negative for dizziness, syncope, facial asymmetry, speech difficulty, weakness and light-headedness.       Objective:   Physical Exam  Vitals reviewed. Constitutional: She is oriented to person,  place, and time. She appears well-developed and well-nourished.  HENT:  Head: Normocephalic and atraumatic.  No mucosal swelling of the posterior op, but slight mid tongue swelling/edema. No lip swelling. Clearing secretions normally, no stridor. Speaking normally and in full sentences.   Eyes: Conjunctivae and EOM are normal. Pupils are equal, round, and reactive to light.  Neck: Carotid bruit is not present.  Cardiovascular: Normal rate, regular rhythm, normal heart sounds and intact distal pulses.  Exam reveals no gallop and no friction rub.   No murmur heard. Pulmonary/Chest:  Effort normal and breath sounds normal. No stridor. No respiratory distress. She has no wheezes. She has no rales.  Abdominal: She exhibits no pulsatile midline mass.  Musculoskeletal: She exhibits no edema (No apparent edema).  Lymphadenopathy:    She has no cervical adenopathy.  Neurological: She is alert and oriented to person, place, and time.  Skin: Skin is warm and dry. No rash noted.  Psychiatric: She has a normal mood and affect. Her behavior is normal.    Filed Vitals:   08/28/13 1109  BP: 190/110  Pulse: 84  Temp: 98.9 F (37.2 C)  TempSrc: Oral  Resp: 18  Height: 5\' 6"  (1.676 m)  Weight: 180 lb (81.647 kg)  SpO2: 99%    12:36 PM Feeling like bottom lip is now tingling. Benadryl 50mg  IM given. Moved to room 7.  Normal resp effort.    1:13 PM Tongue still swollen - distal aspect. No dyspnea, normal resp effort. No stridor or wheeze. No posterior op or lip swelling. Will continue to monitor.   2:02 PM Tongue has continued to swell more. No other oropharynx change. No difficulty swallowing water. No change in respiratory status. Will attempt IV for solumedrol 125 mg and for further monitoring.  2:31 PM 2 attempts at IV unsuccessful.  Sensation of something in back of throat remains, but able to clear fluids.  EMS called for transport for airway monitoring and possible IV placement in ER.   2:48 PM BP 220/140 lg cuff R arm . EMS report and transition of care.  Advised Camera operator at [facility].   Over 1 hour of total face to face care with frequent repeat evaluations.      Assessment & Plan:   Yolanda Jennings is a 43 y.o. female HTN (hypertension) - Plan: hydrochlorothiazide (HYDRODIURIL) 25 MG tablet Remains elevated and increased throughout office visit without apparent end organ involvement or sxs other than pressure in head. EMS transport to ER for allergic reaction but will monitor there for blood pressure.  Sore throat, tongue swelling - suspected Allergic  reaction, ACE inhibitor-aggravated angioedema - Plan: diphenhydrAMINE (BENADRYL) injection 50 mg given. No improvement in off, progressed in the amount of tongue swelling and globus sensation in throat but able to clear secretions and flud. No respiratory compromise present. Unable to place IV but with progression of sxs will transfer to ER for airway monitoring and further monitoring and further evaluation of hypertensive urgency as above   Meds ordered this encounter  Medications  . hydrochlorothiazide (HYDRODIURIL) 25 MG tablet    Sig: Take 1 tablet (25 mg total) by mouth daily.    Dispense:  30 tablet    Refill:  0  . diphenhydrAMINE (BENADRYL) injection 50 mg    Sig:    Patient Instructions  We are transporting you to the emergency room due to progression of your symptoms in office.  Depending on your evaluation there, can start diuretic today.  Continue to  avoid sodium in diet. After the emergency room evaluation - recheck in next 2 days with Harrison Mons, PA-C. You will likely need 2 different blood pressure medications, but can start with one new one today to make sure you tolerate this. Hypertension As your heart beats, it forces blood through your arteries. This force is your blood pressure. If the pressure is too high, it is called hypertension (HTN) or high blood pressure. HTN is dangerous because you may have it and not know it. High blood pressure may mean that your heart has to work harder to pump blood. Your arteries may be narrow or stiff. The extra work puts you at risk for heart disease, stroke, and other problems.  Blood pressure consists of two numbers, a higher number over a lower, 110/72, for example. It is stated as "110 over 72." The ideal is below 120 for the top number (systolic) and under 80 for the bottom (diastolic). Write down your blood pressure today. You should pay close attention to your blood pressure if you have certain conditions such as:  Heart  failure.  Prior heart attack.  Diabetes  Chronic kidney disease.  Prior stroke.  Multiple risk factors for heart disease. To see if you have HTN, your blood pressure should be measured while you are seated with your arm held at the level of the heart. It should be measured at least twice. A one-time elevated blood pressure reading (especially in the Emergency Department) does not mean that you need treatment. There may be conditions in which the blood pressure is different between your right and left arms. It is important to see your caregiver soon for a recheck. Most people have essential hypertension which means that there is not a specific cause. This type of high blood pressure may be lowered by changing lifestyle factors such as:  Stress.  Smoking.  Lack of exercise.  Excessive weight.  Drug/tobacco/alcohol use.  Eating less salt. Most people do not have symptoms from high blood pressure until it has caused damage to the body. Effective treatment can often prevent, delay or reduce that damage. TREATMENT  When a cause has been identified, treatment for high blood pressure is directed at the cause. There are a large number of medications to treat HTN. These fall into several categories, and your caregiver will help you select the medicines that are best for you. Medications may have side effects. You should review side effects with your caregiver. If your blood pressure stays high after you have made lifestyle changes or started on medicines,   Your medication(s) may need to be changed.  Other problems may need to be addressed.  Be certain you understand your prescriptions, and know how and when to take your medicine.  Be sure to follow up with your caregiver within the time frame advised (usually within two weeks) to have your blood pressure rechecked and to review your medications.  If you are taking more than one medicine to lower your blood pressure, make sure you know  how and at what times they should be taken. Taking two medicines at the same time can result in blood pressure that is too low. SEEK IMMEDIATE MEDICAL CARE IF:  You develop a severe headache, blurred or changing vision, or confusion.  You have unusual weakness or numbness, or a faint feeling.  You have severe chest or abdominal pain, vomiting, or breathing problems. MAKE SURE YOU:   Understand these instructions.  Will watch your condition.  Will get  help right away if you are not doing well or get worse. Document Released: 06/25/2005 Document Revised: 09/17/2011 Document Reviewed: 02/13/2008 [facility] Patient Information 2014 [place]. Keep a record of your blood pressures outside of the office and bring them to the next office visit. Return to the clinic or go to the nearest emergency room if any of your symptoms worsen or new symptoms occur.

## 2013-08-28 NOTE — ED Notes (Signed)
Pt comes from urgent care via EMS for angioedema. Pt states that her throat felt scratchy last night then when she woke up this morning her tongue was swollen and went to urgent care. Pt takes lisinopril. PT hypertensive 220/140.

## 2013-08-28 NOTE — H&P (Addendum)
[facility] History and Physical  Yolanda Jennings [id number] DOB: 10/31/1970 DOA: 08/28/2013  Referring physician: ER physician PCP: [name], MD   Chief Complaint: tongue swelling   HPI:  43 year old female with past medical history of hypertension who presented from PCP to [facility] ED 08/28/2013 with acute onset tongue swelling. She was recently put on lisinopril and during her regular check up at PCP her tongue started to swell in addition to having difficulty to swallow. She is able to protect airways. No fever or chills. No respiratory distress. No cough. No chest pain. In ED, BP was 190/110 but she did not receive BP meds in ED. She did get solumedrol, benadryl and Pepcid. She feels little better but still reports tongue being swollen. [facility] asked to admit for further evaluation and management.   Assessment and Plan:   Principal Problem:   Angioedema - secondary to lisinopril - already received solumedrol in ED, benadryl and Pepcid; order placed for solumedrol 60 mg Q 12 hours IV, benadryl IV PRN and Pepcid 20 mg daiyl Active Problems:   Accelerated hypertension - start hydralazine 12 mg every 6 hours PO and then IV PRN for better BP control if needed - admit to stepdown for next 12 to 24 hours for BP observation/control  Radiological Exams on Admission: Dg Chest 2 View 08/27/2013 IMPRESSION: Cardiomegaly.  No active disease.      Code Status: Full Family Communication: Pt at bedside Disposition Plan: Admit for further evaluation  Leisa Lenz, MD  [facility] Pager [phone]  Review of Systems:  Constitutional: Negative for fever, chills and malaise/fatigue. Negative for diaphoresis.  HENT: Negative for hearing loss, ear pain, nosebleeds, congestion, sore throat, neck pain, tinnitus and ear discharge.   Eyes: Negative for blurred vision, double vision, photophobia, pain, discharge and redness.  Respiratory: Negative for cough, hemoptysis, sputum  production, shortness of breath, wheezing and stridor.   Cardiovascular: Negative for chest pain, palpitations, orthopnea, claudication and leg swelling.  Gastrointestinal: Negative for nausea, vomiting and abdominal pain. Negative for heartburn, constipation, blood in stool and melena.  Genitourinary: Negative for dysuria, urgency, frequency, hematuria and flank pain.  Musculoskeletal: Negative for myalgias, back pain, joint pain and falls.  Skin: Negative for itching and rash.  Neurological: Negative for dizziness and weakness. Negative for tingling, tremors, sensory change, speech change, focal weakness, loss of consciousness and headaches.  Endo/Heme/Allergies: Negative for polydipsia. Does not bruise/bleed easily.  Psychiatric/Behavioral: Negative for suicidal ideas. The patient is not nervous/anxious.      Past Medical History  Diagnosis Date  . Sickle cell trait   . Hypertension    Past Surgical History  Procedure Laterality Date  . Tubal ligation      2003  . Breast biopsy Left 2011   Social History:  reports that she quit smoking 4 days ago. She has never used smokeless tobacco. She reports that she drinks alcohol. She reports that she does not use illicit drugs.  Allergies  Allergen Reactions  . Ace Inhibitors Swelling    Tongue swelling  . Lisinopril Swelling    Family History  Problem Relation Age of Onset  . Cancer Mother     lung & cervical  . Hypertension Mother      Prior to Admission medications   Medication Sig Start Date End Date Taking? Authorizing Provider  ibuprofen (ADVIL,MOTRIN) 200 MG tablet Take 400 mg by mouth every 4 (four) hours as needed for moderate pain.    Yes Historical Provider, MD  hydrochlorothiazide (HYDRODIURIL) 25 MG tablet Take 1 tablet (25 mg total) by mouth daily. 08/28/13   [name], MD  lisinopril (PRINIVIL,ZESTRIL) 20 MG tablet Take 20 mg by mouth daily.    Historical Provider, MD   Physical Exam: Filed Vitals:    08/28/13 1700 08/28/13 1720 08/28/13 1740 08/28/13 1800  BP: 211/111 205/118 203/114 204/125  Pulse: 77 81 76 79  Temp:      TempSrc:      Resp: 14 31 18 21   SpO2: 99% 97% 99% 99%    Physical Exam  Constitutional: Appears well-developed and well-nourished. No distress.  HENT: Normocephalic. External right and left ear normal. Tongue swelling appreciated Eyes: Conjunctivae and EOM are normal. PERRLA, no scleral icterus.  Neck: Normal ROM. Neck supple. No JVD. No tracheal deviation. No thyromegaly.  CVS: RRR, S1/S2 +, no murmurs, no gallops, no carotid bruit.  Pulmonary: Effort and breath sounds normal, no stridor, rhonchi, wheezes, rales.  Abdominal: Soft. BS +,  no distension, tenderness, rebound or guarding.  Musculoskeletal: Normal range of motion. No edema and no tenderness.  Lymphadenopathy: No lymphadenopathy noted, cervical, inguinal. Neuro: Alert. Normal reflexes, muscle tone coordination. No cranial nerve deficit. Skin: Skin is warm and dry. No rash noted. Not diaphoretic. No erythema. No pallor.  Psychiatric: Normal mood and affect. Behavior, judgment, thought content normal.   Labs on Admission:  Basic Metabolic Panel:  Recent Labs Lab 08/24/13 1900 08/27/13 0221 08/28/13 1817  NA 138 140 142  K 3.7 3.1* 3.8  CL 103 114* 107  CO2 23  --   --   GLUCOSE 95 117* 88  BUN 9 6 7   CREATININE 0.94 1.10 1.00  CALCIUM 8.7  --   --    Liver Function Tests: No results found for this basename: AST, ALT, ALKPHOS, BILITOT, PROT, ALBUMIN,  in the last 168 hours No results found for this basename: LIPASE, AMYLASE,  in the last 168 hours No results found for this basename: AMMONIA,  in the last 168 hours CBC:  Recent Labs Lab 08/24/13 1900 08/27/13 0213 08/27/13 0221 08/28/13 1817  WBC 6.4 6.9  --   --   NEUTROABS  --  4.5  --   --   HGB 11.5* 11.2* 12.6 14.3  HCT 33.7* 33.5* 37.0 42.0  MCV 72.9* 74.8*  --   --   PLT 210 235  --   --    Cardiac Enzymes: No results  found for this basename: CKTOTAL, CKMB, CKMBINDEX, TROPONINI,  in the last 168 hours BNP: No components found with this basename: POCBNP,  CBG: No results found for this basename: GLUCAP,  in the last 168 hours  If 7PM-7AM, please contact night-coverage [email or web address] Password [email or web address] 08/28/2013, 6:20 PM

## 2013-08-28 NOTE — ED Provider Notes (Signed)
CSN: [id number]     Arrival date & time 08/28/13  1509 History   First MD Initiated Contact with Patient 08/28/13 1515     Chief Complaint  Patient presents with  . Angioedema     (Consider location/radiation/quality/duration/timing/severity/associated sxs/prior Treatment) HPI Comments: Patient presents with tongue swelling. She has a history of hypertension and was recently started on lisinopril. She took 4 doses of lisinopril and today started noticing some tongue swelling. She states she was at her doctor's office for a checkup on her hypertension when her tongue started to swell. This started about 11:00 today and has gotten worse since that time. She's having trouble talking due to the tongue swelling. She denies a shortness of breath or difficulty controlling her secretions. She denies a history of allergic reactions in the past. She denies any other facial swelling. She denies any rash or itching. They were unable to get an IV established from the urgent care Center. She was given 50 mg of Benadryl IM.   Past Medical History  Diagnosis Date  . Sickle cell trait   . Hypertension    Past Surgical History  Procedure Laterality Date  . Tubal ligation      2003  . Breast biopsy Left 2011   Family History  Problem Relation Age of Onset  . Cancer Mother     lung & cervical  . Hypertension Mother    History  Substance Use Topics  . Smoking status: Current Every Day Smoker  . Smokeless tobacco: Not on file  . Alcohol Use: Yes   OB History   Grav Para Term Preterm Abortions TAB SAB Ect Mult Living   2 2 2       2      Review of Systems  Constitutional: Negative for fever, chills, diaphoresis and fatigue.  HENT: Positive for facial swelling, trouble swallowing and voice change. Negative for congestion, drooling, rhinorrhea and sneezing.   Eyes: Negative.   Respiratory: Negative for cough, chest tightness and shortness of breath.   Cardiovascular: Negative for chest pain and  leg swelling.  Gastrointestinal: Negative for nausea, vomiting, abdominal pain, diarrhea and blood in stool.  Genitourinary: Negative for frequency, hematuria, flank pain and difficulty urinating.  Musculoskeletal: Negative for arthralgias and back pain.  Skin: Negative for rash.  Neurological: Negative for dizziness, speech difficulty, weakness, numbness and headaches.      Allergies  Ace inhibitors and Lisinopril  Home Medications   Current Outpatient Rx  Name  Route  Sig  Dispense  Refill  . hydrochlorothiazide (HYDRODIURIL) 25 MG tablet   Oral   Take 1 tablet (25 mg total) by mouth daily.   30 tablet   0   . ibuprofen (ADVIL,MOTRIN) 200 MG tablet   Oral   Take 400 mg by mouth every 4 (four) hours as needed for moderate pain.           BP 210/107  Pulse 82  Temp(Src) 98.6 F (37 C) (Oral)  Resp 17  SpO2 100%  LMP 08/16/2013 Physical Exam  Constitutional: She is oriented to person, place, and time. She appears well-developed and well-nourished.  HENT:  Head: Normocephalic and atraumatic.  Patient has diffuse swelling of her tongue. There is no other facial swelling noted. She is able to control her secretions. There's no stridor.  Eyes: Pupils are equal, round, and reactive to light.  Neck: Normal range of motion. Neck supple.  Cardiovascular: Normal rate, regular rhythm and normal heart sounds.   Pulmonary/Chest:  Effort normal and breath sounds normal. No respiratory distress. She has no wheezes. She has no rales. She exhibits no tenderness.  Abdominal: Soft. Bowel sounds are normal. There is no tenderness. There is no rebound and no guarding.  Musculoskeletal: Normal range of motion. She exhibits no edema.  Lymphadenopathy:    She has no cervical adenopathy.  Neurological: She is alert and oriented to person, place, and time.  Skin: Skin is warm and dry. No rash noted.  Psychiatric: She has a normal mood and affect.    ED Course  Procedures (including  critical care time) Labs Review Labs Reviewed - No data to display Imaging Review Dg Chest 2 View  08/27/2013   CLINICAL DATA:  Hypertension.  EXAM: CHEST  2 VIEW  COMPARISON:  12/21/2009  FINDINGS: Mild cardiomegaly. No confluent airspace opacities in the lungs. No effusions. No acute bony abnormality.  IMPRESSION: Cardiomegaly.  No active disease.   Electronically Signed   By: Rolm Baptise M.D.   On: 08/27/2013 02:26    EKG Interpretation   None       MDM   Final diagnoses:  None    15:45: pt stable, no airway worsening  16:22: no changes, no worsening airway.  17:30: pt has some improvement of symptoms, swelling has gone down, but tongue is still pretty swollen, will consult [facility] to admit overnight for obs.  CRITICAL CARE Performed by: ,  Total critical care time: 60 Critical care time was exclusive of separately billable procedures and treating other patients. Critical care was necessary to treat or prevent imminent or life-threatening deterioration. Critical care was time spent personally by me on the following activities: development of treatment plan with patient and/or surrogate as well as nursing, discussions with consultants, evaluation of patient's response to treatment, examination of patient, obtaining history from patient or surrogate, ordering and performing treatments and interventions, ordering and review of laboratory studies, ordering and review of radiographic studies, pulse oximetry and re-evaluation of patient's condition.   Malvin Johns, MD 08/28/13 [phone]

## 2013-08-28 NOTE — Patient Instructions (Addendum)
We are transporting you to the emergency room due to progression of your symptoms in office.  Depending on your evaluation there, can start diuretic today.  Continue to avoid sodium in diet. After the emergency room evaluation - recheck in next 2 days with Harrison Mons, PA-C. You will likely need 2 different blood pressure medications, but can start with one new one today to make sure you tolerate this. Hypertension As your heart beats, it forces blood through your arteries. This force is your blood pressure. If the pressure is too high, it is called hypertension (HTN) or high blood pressure. HTN is dangerous because you may have it and not know it. High blood pressure may mean that your heart has to work harder to pump blood. Your arteries may be narrow or stiff. The extra work puts you at risk for heart disease, stroke, and other problems.  Blood pressure consists of two numbers, a higher number over a lower, 110/72, for example. It is stated as "110 over 72." The ideal is below 120 for the top number (systolic) and under 80 for the bottom (diastolic). Write down your blood pressure today. You should pay close attention to your blood pressure if you have certain conditions such as:  Heart failure.  Prior heart attack.  Diabetes  Chronic kidney disease.  Prior stroke.  Multiple risk factors for heart disease. To see if you have HTN, your blood pressure should be measured while you are seated with your arm held at the level of the heart. It should be measured at least twice. A one-time elevated blood pressure reading (especially in the Emergency Department) does not mean that you need treatment. There may be conditions in which the blood pressure is different between your right and left arms. It is important to see your caregiver soon for a recheck. Most people have essential hypertension which means that there is not a specific cause. This type of high blood pressure may be lowered by changing  lifestyle factors such as:  Stress.  Smoking.  Lack of exercise.  Excessive weight.  Drug/tobacco/alcohol use.  Eating less salt. Most people do not have symptoms from high blood pressure until it has caused damage to the body. Effective treatment can often prevent, delay or reduce that damage. TREATMENT  When a cause has been identified, treatment for high blood pressure is directed at the cause. There are a large number of medications to treat HTN. These fall into several categories, and your caregiver will help you select the medicines that are best for you. Medications may have side effects. You should review side effects with your caregiver. If your blood pressure stays high after you have made lifestyle changes or started on medicines,   Your medication(s) may need to be changed.  Other problems may need to be addressed.  Be certain you understand your prescriptions, and know how and when to take your medicine.  Be sure to follow up with your caregiver within the time frame advised (usually within two weeks) to have your blood pressure rechecked and to review your medications.  If you are taking more than one medicine to lower your blood pressure, make sure you know how and at what times they should be taken. Taking two medicines at the same time can result in blood pressure that is too low. SEEK IMMEDIATE MEDICAL CARE IF:  You develop a severe headache, blurred or changing vision, or confusion.  You have unusual weakness or numbness, or a faint feeling.  You have severe chest or abdominal pain, vomiting, or breathing problems. MAKE SURE YOU:   Understand these instructions.  Will watch your condition.  Will get help right away if you are not doing well or get worse. Document Released: 06/25/2005 Document Revised: 09/17/2011 Document Reviewed: 02/13/2008 [facility] Patient Information 2014 [place]. Keep a record of your blood pressures outside of the office and  bring them to the next office visit. Return to the clinic or go to the nearest emergency room if any of your symptoms worsen or new symptoms occur.

## 2013-08-28 NOTE — Progress Notes (Signed)
   CARE MANAGEMENT ED NOTE 08/28/2013  Patient:  Yolanda Jennings   Account Number:  [id number]  Date Initiated:  08/28/2013  Documentation initiated by:  Jackelyn Poling  Subjective/Objective Assessment:   43 yr old [place] managed pt c/o tongue swelling. She has a history of hypertension and was recently started on lisinopril     Subjective/Objective Assessment Detail:   no pcp listed Pt confirms pcp as [facility]     Action/Plan:   EPIC updated   Action/Plan Detail:   Anticipated DC Date:  08/29/2013     Status Recommendation to Physician:   Result of Recommendation:    Other ED [place]  Other  PCP issues  Outpatient Services - Pt will follow up    Choice offered to / List presented to:            Status of service:  Completed, signed off  ED Comments:   ED Comments Detail:

## 2013-08-29 DIAGNOSIS — I160 Hypertensive urgency: Secondary | ICD-10-CM | POA: Diagnosis present

## 2013-08-29 LAB — CBC
HCT: 36.9 % (ref 36.0–46.0)
HEMOGLOBIN: 12.2 g/dL (ref 12.0–15.0)
MCH: 24.4 pg — ABNORMAL LOW (ref 26.0–34.0)
MCHC: 33.1 g/dL (ref 30.0–36.0)
MCV: 73.9 fL — AB (ref 78.0–100.0)
Platelets: 268 10*3/uL (ref 150–400)
RBC: 4.99 MIL/uL (ref 3.87–5.11)
RDW: 16.8 % — ABNORMAL HIGH (ref 11.5–15.5)
WBC: 7.6 10*3/uL (ref 4.0–10.5)

## 2013-08-29 LAB — GLUCOSE, CAPILLARY: GLUCOSE-CAPILLARY: 132 mg/dL — AB (ref 70–99)

## 2013-08-29 LAB — COMPREHENSIVE METABOLIC PANEL
ALBUMIN: 3.6 g/dL (ref 3.5–5.2)
ALT: 7 U/L (ref 0–35)
AST: 14 U/L (ref 0–37)
Alkaline Phosphatase: 96 U/L (ref 39–117)
BUN: 11 mg/dL (ref 6–23)
CALCIUM: 9.2 mg/dL (ref 8.4–10.5)
CO2: 20 mEq/L (ref 19–32)
Chloride: 101 mEq/L (ref 96–112)
Creatinine, Ser: 1.1 mg/dL (ref 0.50–1.10)
GFR calc non Af Amer: 61 mL/min — ABNORMAL LOW (ref >90)
GFR, EST AFRICAN AMERICAN: 70 mL/min — AB (ref >90)
GLUCOSE: 134 mg/dL — AB (ref 70–99)
Potassium: 3.7 mEq/L (ref 3.7–5.3)
SODIUM: 137 meq/L (ref 137–147)
Total Bilirubin: 0.3 mg/dL (ref 0.3–1.2)
Total Protein: 7.7 g/dL (ref 6.0–8.3)

## 2013-08-29 LAB — TSH: TSH: 0.725 u[IU]/mL (ref 0.350–4.500)

## 2013-08-29 MED ORDER — HYDRALAZINE HCL 25 MG PO TABS
25.0000 mg | ORAL_TABLET | Freq: Three times a day (TID) | ORAL | Status: DC
  Administered 2013-08-29 – 2013-08-30 (×3): 25 mg via ORAL
  Filled 2013-08-29 (×6): qty 1

## 2013-08-29 MED ORDER — CLONIDINE HCL 0.1 MG PO TABS: 0.1000 mg | ORAL_TABLET | Freq: Two times a day (BID) | ORAL | Status: DC

## 2013-08-29 MED ORDER — CLONIDINE HCL 0.1 MG PO TABS
0.1000 mg | ORAL_TABLET | Freq: Once | ORAL | Status: AC
  Administered 2013-08-29: 0.1 mg via ORAL
  Filled 2013-08-29: qty 1

## 2013-08-29 MED ORDER — AMLODIPINE BESYLATE 5 MG PO TABS
5.0000 mg | ORAL_TABLET | Freq: Every day | ORAL | Status: DC
Start: 2013-08-29 — End: 2013-08-30
  Administered 2013-08-29: 5 mg via ORAL
  Filled 2013-08-29 (×3): qty 1

## 2013-08-29 MED ORDER — CLONIDINE HCL 0.1 MG PO TABS
0.1000 mg | ORAL_TABLET | Freq: Every day | ORAL | Status: DC
  Administered 2013-08-29: 0.1 mg via ORAL
  Filled 2013-08-29 (×2): qty 1

## 2013-08-29 NOTE — Progress Notes (Signed)
Utilization Review completed.  

## 2013-08-29 NOTE — Progress Notes (Signed)
[facility] PROGRESS NOTE  Yolanda Jennings [id number]:[id number] DOB: 07-22-1970 DOA: 08/28/2013 PCP: [name], MD Brief HPI: 43 year old female with past medical history of hypertension who presented from PCP to [facility] ED 08/28/2013 with acute onset tongue swelling. She was recently put on lisinopril and during her regular check up at PCP her tongue started to swell in addition to having difficulty to swallow. She is able to protect airways. No fever or chills. No respiratory distress. No cough. No chest pain.  In ED, BP was 190/110 but she did not receive BP meds in ED. She did get solumedrol, benadryl and Pepcid. She feels little better but still reports tongue being swollen. [facility] asked to admit for further evaluation and management.   Assessment/Plan:  Angioedema  - secondary to lisinopril  - already received solumedrol in ED, benadryl and Pepcid; order placed for solumedrol 60 mg Q 12 hours IV, benadryl IV PRN and Pepcid 20 mg daiyl   Accelerated hypertension  - start hydralazine 12 mg every 6 hours PO and then IV PRN for better BP control if needed  -start pt on catapres and corvasc and hydralazine.    Code Status: Full  Family Communication: Pt at bedside  Disposition Plan: Admit for further evaluation   Consultants:  None   Procedures: none    HPI/Subjective: Headache.   Objective: Filed Vitals:   08/29/13 0533  BP: 155/81  Pulse: 76  Temp:   Resp: 19   No intake or output data in the 24 hours ending 08/29/13 0830 Filed Weights   08/28/13 2000 08/29/13 0648  Weight: 82.645 kg (182 lb 3.2 oz) 81.2 kg (179 lb 0.2 oz)    Exam:   General:  Alert afebrile comfortable  Cardiovascular:s1s2  Respiratory: ctab  Abdomen: soft NT nd bs+  Musculoskeletal: no pedal edema.   Data Reviewed: Basic Metabolic Panel:  Recent Labs Lab 08/24/13 1900 08/27/13 0221 08/28/13 1808 08/28/13 1817 08/29/13 0342  NA 138 140 139 142 137  K 3.7 3.1* 4.1 3.8 3.7   CL 103 114* 104 107 101  CO2 23  --  20  --  20  GLUCOSE 95 117* 89 88 134*  BUN 9 6 8 7 11   CREATININE 0.94 1.10 0.95 1.00 1.10  CALCIUM 8.7  --  9.2  --  9.2  MG  --   --  1.9  --   --   PHOS  --   --  2.1*  --   --    Liver Function Tests:  Recent Labs Lab 08/28/13 1808 08/29/13 0342  AST 18 14  ALT 7 7  ALKPHOS 101 96  BILITOT 0.5 0.3  PROT 8.1 7.7  ALBUMIN 4.0 3.6   No results found for this basename: LIPASE, AMYLASE,  in the last 168 hours No results found for this basename: AMMONIA,  in the last 168 hours CBC:  Recent Labs Lab 08/24/13 1900 08/27/13 0213 08/27/13 0221 08/28/13 1808 08/28/13 1817 08/29/13 0342  WBC 6.4 6.9  --  9.5  --  7.6  NEUTROABS  --  4.5  --  8.5*  --   --   HGB 11.5* 11.2* 12.6 12.6 14.3 12.2  HCT 33.7* 33.5* 37.0 37.0 42.0 36.9  MCV 72.9* 74.8*  --  73.9*  --  73.9*  PLT 210 235  --  244  --  268   Cardiac Enzymes: No results found for this basename: CKTOTAL, CKMB, CKMBINDEX, TROPONINI,  in the last 168  hours BNP (last 3 results) No results found for this basename: PROBNP,  in the last 8760 hours CBG: No results found for this basename: GLUCAP,  in the last 168 hours  Recent Results (from the past 240 hour(s))  MRSA PCR SCREENING     Status: None   Collection Time    08/28/13  7:59 PM      Result Value Ref Range Status   MRSA by PCR NEGATIVE  NEGATIVE Final   Comment:            The GeneXpert MRSA Assay (FDA     approved for NASAL specimens     only), is one component of a     comprehensive MRSA colonization     surveillance program. It is not     intended to diagnose MRSA     infection nor to guide or     monitor treatment for     MRSA infections.     Studies: No results found.  Scheduled Meds: . famotidine  20 mg Oral Daily  . hydrALAZINE  10 mg Oral 4 times per day  . methylPREDNISolone (SOLU-MEDROL) injection  60 mg Intravenous Q12H  . sodium chloride  3 mL Intravenous Q12H   Continuous Infusions: .  sodium chloride      Principal Problem:   Angioedema Active Problems:   HTN (hypertension)    Time spent: 25 minutes.     [facility]  [facility] Pager [phone]. If 7PM-7AM, please contact night-coverage at [email or web address], password [facility] 08/29/2013, 8:30 AM  LOS: 1 day

## 2013-08-30 LAB — GLUCOSE, CAPILLARY: Glucose-Capillary: 122 mg/dL — ABNORMAL HIGH (ref 70–99)

## 2013-08-30 MED ORDER — CLONIDINE HCL 0.1 MG PO TABS
0.1000 mg | ORAL_TABLET | Freq: Two times a day (BID) | ORAL | Status: DC
  Administered 2013-08-30: 0.1 mg via ORAL
  Filled 2013-08-30 (×2): qty 1

## 2013-08-30 MED ORDER — CLONIDINE HCL 0.1 MG PO TABS
0.1000 mg | ORAL_TABLET | Freq: Three times a day (TID) | ORAL | Status: DC
  Administered 2013-08-30: 0.1 mg via ORAL
  Filled 2013-08-30 (×2): qty 1

## 2013-08-30 MED ORDER — LABETALOL HCL 5 MG/ML IV SOLN
5.0000 mg | Freq: Once | INTRAVENOUS | Status: AC
  Administered 2013-08-30: 5 mg via INTRAVENOUS
  Filled 2013-08-30: qty 4

## 2013-08-30 MED ORDER — CLONIDINE HCL 0.1 MG PO TABS: 0.1000 mg | ORAL_TABLET | Freq: Three times a day (TID) | ORAL | Status: DC

## 2013-08-30 MED ORDER — HYDRALAZINE HCL 50 MG PO TABS
50.0000 mg | ORAL_TABLET | Freq: Three times a day (TID) | ORAL | Status: DC
  Administered 2013-08-30 – 2013-09-03 (×12): 50 mg via ORAL
  Filled 2013-08-30 (×15): qty 1

## 2013-08-30 MED ORDER — AMLODIPINE BESYLATE 10 MG PO TABS
10.0000 mg | ORAL_TABLET | Freq: Every day | ORAL | Status: DC
  Administered 2013-08-30 – 2013-09-03 (×5): 10 mg via ORAL
  Filled 2013-08-30 (×5): qty 1

## 2013-08-30 MED ORDER — CLONIDINE HCL 0.1 MG PO TABS
0.1000 mg | ORAL_TABLET | Freq: Three times a day (TID) | ORAL | Status: DC
  Administered 2013-08-30: 0.1 mg via ORAL
  Filled 2013-08-30 (×4): qty 1

## 2013-08-30 NOTE — Progress Notes (Signed)
[facility] PROGRESS NOTE  Yolanda Jennings JOI:[id number] DOB: [birth year] DOA: 08/28/2013 PCP: [name], MD Brief HPI: 43 year old female with past medical history of hypertension who presented from PCP to [facility] ED 08/28/2013 with acute onset tongue swelling. She was recently put on lisinopril and during her regular check up at PCP her tongue started to swell in addition to having difficulty to swallow. She is able to protect airways. No fever or chills. No respiratory distress. No cough. No chest pain.  In ED, BP was 190/110 but she did not receive BP meds in ED. She did get solumedrol, benadryl and Pepcid. She feels little better but still reports tongue being swollen. [facility] asked to admit for further evaluation and management.   Assessment/Plan:  Angioedema  - secondary to lisinopril  - already received solumedrol in ED, benadryl and Pepcid; will d/c solumedrol and continue with benadryl and pepcid.  Accelerated hypertension  - start hydralazine 12 mg every 6 hours PO and then IV PRN for better BP control if needed  -start pt on catapres and Norvasc and hydralazine.   DVT prophylaxis.    Code Status: Full  Family Communication: none at bedside.  Disposition Plan: possible d/c in am.   Consultants:  None   Procedures: none    HPI/Subjective: Headache resolved. .   Objective: Filed Vitals:   08/30/13 1229  BP: 154/81  Pulse:   Temp:   Resp:     Intake/Output Summary (Last 24 hours) at 08/30/13 1254 Last data filed at 08/30/13 0955  Gross per 24 hour  Intake    243 ml  Output      0 ml  Net    243 ml   Filed Weights   08/28/13 2000 08/29/13 0648 08/30/13 0300  Weight: 82.645 kg (182 lb 3.2 oz) 81.2 kg (179 lb 0.2 oz) 82.2 kg (181 lb 3.5 oz)    Exam:   General:  Alert afebrile comfortable  Cardiovascular:s1s2  Respiratory: ctab  Abdomen: soft NT nd bs+  Musculoskeletal: no pedal edema.   Data Reviewed: Basic Metabolic Panel:  Recent  Labs Lab 08/24/13 1900 08/27/13 0221 08/28/13 1808 08/28/13 1817 08/29/13 0342  NA 138 140 139 142 137  K 3.7 3.1* 4.1 3.8 3.7  CL 103 114* 104 107 101  CO2 23  --  20  --  20  GLUCOSE 95 117* 89 88 134*  BUN 9 6 8 7 11   CREATININE 0.94 1.10 0.95 1.00 1.10  CALCIUM 8.7  --  9.2  --  9.2  MG  --   --  1.9  --   --   PHOS  --   --  2.1*  --   --    Liver Function Tests:  Recent Labs Lab 08/28/13 1808 08/29/13 0342  AST 18 14  ALT 7 7  ALKPHOS 101 96  BILITOT 0.5 0.3  PROT 8.1 7.7  ALBUMIN 4.0 3.6   No results found for this basename: LIPASE, AMYLASE,  in the last 168 hours No results found for this basename: AMMONIA,  in the last 168 hours CBC:  Recent Labs Lab 08/24/13 1900 08/27/13 0213 08/27/13 0221 08/28/13 1808 08/28/13 1817 08/29/13 0342  WBC 6.4 6.9  --  9.5  --  7.6  NEUTROABS  --  4.5  --  8.5*  --   --   HGB 11.5* 11.2* 12.6 12.6 14.3 12.2  HCT 33.7* 33.5* 37.0 37.0 42.0 36.9  MCV 72.9* 74.8*  --  73.9*  --  73.9*  PLT 210 235  --  244  --  268   Cardiac Enzymes: No results found for this basename: CKTOTAL, CKMB, CKMBINDEX, TROPONINI,  in the last 168 hours BNP (last 3 results) No results found for this basename: PROBNP,  in the last 8760 hours CBG:  Recent Labs Lab 08/29/13 0831 08/30/13 0754  GLUCAP 132* 122*    Recent Results (from the past 240 hour(s))  MRSA PCR SCREENING     Status: None   Collection Time    08/28/13  7:59 PM      Result Value Ref Range Status   MRSA by PCR NEGATIVE  NEGATIVE Final   Comment:            The GeneXpert MRSA Assay (FDA     approved for NASAL specimens     only), is one component of a     comprehensive MRSA colonization     surveillance program. It is not     intended to diagnose MRSA     infection nor to guide or     monitor treatment for     MRSA infections.     Studies: No results found.  Scheduled Meds: . amLODipine  10 mg Oral Daily  . cloNIDine  0.1 mg Oral BID  . famotidine  20 mg  Oral Daily  . hydrALAZINE  50 mg Oral 3 times per day  . sodium chloride  3 mL Intravenous Q12H   Continuous Infusions: . sodium chloride      Principal Problem:   Angioedema Active Problems:   HTN (hypertension)   Hypertensive urgency    Time spent: 25 minutes.     [facility]  [facility] Pager [phone]. If 7PM-7AM, please contact night-coverage at [email or web address], password [facility] 08/30/2013, 12:54 PM  LOS: 2 days

## 2013-08-31 ENCOUNTER — Inpatient Hospital Stay (HOSPITAL_COMMUNITY): Payer: Managed Care, Other (non HMO)

## 2013-08-31 ENCOUNTER — Encounter (HOSPITAL_COMMUNITY): Payer: Self-pay

## 2013-08-31 LAB — GLUCOSE, CAPILLARY: Glucose-Capillary: 108 mg/dL — ABNORMAL HIGH (ref 70–99)

## 2013-08-31 MED ORDER — IOHEXOL 300 MG/ML  SOLN
25.0000 mL | INTRAMUSCULAR | Status: AC
  Administered 2013-08-31: 25 mL via ORAL

## 2013-08-31 MED ORDER — IOHEXOL 300 MG/ML  SOLN
100.0000 mL | Freq: Once | INTRAMUSCULAR | Status: AC | PRN
  Administered 2013-08-31: 100 mL via INTRAVENOUS

## 2013-08-31 MED ORDER — LABETALOL HCL 5 MG/ML IV SOLN
10.0000 mg | Freq: Once | INTRAVENOUS | Status: AC
  Administered 2013-08-31: 10 mg via INTRAVENOUS
  Filled 2013-08-31: qty 4

## 2013-08-31 MED ORDER — CLONIDINE HCL 0.2 MG PO TABS
0.2000 mg | ORAL_TABLET | Freq: Three times a day (TID) | ORAL | Status: DC
  Administered 2013-08-31 – 2013-09-02 (×8): 0.2 mg via ORAL
  Filled 2013-08-31 (×12): qty 1

## 2013-08-31 MED ORDER — ISOSORBIDE MONONITRATE ER 30 MG PO TB24
30.0000 mg | ORAL_TABLET | Freq: Every day | ORAL | Status: DC
  Administered 2013-09-01 – 2013-09-02 (×2): 30 mg via ORAL
  Filled 2013-08-31 (×3): qty 1

## 2013-08-31 NOTE — Progress Notes (Signed)
Pt complains of severe headache, crying in bed. bp 200/120 hr 84, o2 sat 100 on ra. Manual bp 180/98, pt given 20 mg iv hydraulizine. md Paged. Will cont to assess.

## 2013-08-31 NOTE — Progress Notes (Signed)
MD order for hydralazine PRN with systolic BP greater than 161. Upon looking through pt's chart she has not had a systolic BP less than 096 since being admitted and has received multiple doses of IV hydralazine with minimal effect on blood pressure. NP on call was notified and situation was discussed reguarding whether to administer PRN hydralazine, administer another antihypertensive and if we should modify BP parameters. Orders given and implemented. At this time, pt/RN/NP in agreement that pt will not receive the PRN hydralazine that was previously ordered. 2200 BP was 183/92 and after administering medications, follow BP was obtained at 2330 results 150/82. NP notified via amion text. Also have given pt a written explanation of emotional release exercises that may aid in lower blood pressure along with pharmaceutical interventions. Will continue to monitor

## 2013-08-31 NOTE — Progress Notes (Signed)
[facility] PROGRESS NOTE  Yolanda Jennings FXT:[id number] DOB: 10/21/1970 DOA: 08/28/2013 PCP: [name], MD  Brief narrative: 43 year old female with past medical history of hypertension who presented from PCP to [facility] ED 08/28/2013 with acute onset tongue swelling. She was recently put on lisinopril and during her regular check up at PCP her tongue started to swell in addition to having difficulty to swallow.  In ED, BP was 190/110 but she did not receive BP meds in ED. She did get solumedrol, benadryl and Pepcid. Hospital course is complicated due to ongoing accelerated hypertension even with multiple antihypertensives. Cardiology asked to help with management of hypertension.  Assessment and Plan:   Principal Problem:  Angioedema  - secondary to lisinopril  - Has received Solu-Medrol, benadryl and Pepcid - She was then on Solu-Medrol 60 mg every 12 hours IV. Her swelling has improved significantly.  Active Problems:  Accelerated hypertension  - ion multiple medications: Norvasc, hydralazine, clonidine and Imdur - BP still high at 165/80 - rule out pheochromocytoma or possible RAS - appreciate cardio consult   Code Status: Full  Family Communication: Pt at bedside  Disposition Plan: Admit for further evaluation    Leisa Lenz, MD  [facility] Pager [phone]  If 7PM-7AM, please contact night-coverage [email or web address] Password [email or web address] 08/31/2013, 1:52 PM   LOS: 3 days   Consultants:  Cardiology   Procedures:  None   Antibiotics:  None   HPI/Subjective: No acute overnight events.   Objective: Filed Vitals:   08/31/13 8341 08/31/13 0856 08/31/13 1015 08/31/13 1312  BP: 160/90 150/90 160/89 165/80  Pulse:   78   Temp:   97.9 F (36.6 C)   TempSrc:      Resp:   18   Height:      Weight:      SpO2:   99%    No intake or output data in the 24 hours ending 08/31/13 1352  Exam:   General:  Pt is alert, follows commands appropriately, not  in acute distress  Cardiovascular: Regular rate and rhythm, S1/S2, no murmurs, no rubs, no gallops  Respiratory: Clear to auscultation bilaterally, no wheezing, no crackles, no rhonchi  Abdomen: Soft, non tender, non distended, bowel sounds present, no guarding  Extremities: No edema, pulses DP and PT palpable bilaterally  Neuro: Grossly nonfocal  Data Reviewed: Basic Metabolic Panel:  Recent Labs Lab 08/24/13 1900 08/27/13 0221 08/28/13 1808 08/28/13 1817 08/29/13 0342  NA 138 140 139 142 137  K 3.7 3.1* 4.1 3.8 3.7  CL 103 114* 104 107 101  CO2 23  --  20  --  20  GLUCOSE 95 117* 89 88 134*  BUN 9 6 8 7 11   CREATININE 0.94 1.10 0.95 1.00 1.10  CALCIUM 8.7  --  9.2  --  9.2  MG  --   --  1.9  --   --   PHOS  --   --  2.1*  --   --    Liver Function Tests:  Recent Labs Lab 08/28/13 1808 08/29/13 0342  AST 18 14  ALT 7 7  ALKPHOS 101 96  BILITOT 0.5 0.3  PROT 8.1 7.7  ALBUMIN 4.0 3.6   No results found for this basename: LIPASE, AMYLASE,  in the last 168 hours No results found for this basename: AMMONIA,  in the last 168 hours CBC:  Recent Labs Lab 08/24/13 1900 08/27/13 0213 08/27/13 0221 08/28/13 1808 08/28/13 1817 08/29/13 0342  WBC 6.4  6.9  --  9.5  --  7.6  NEUTROABS  --  4.5  --  8.5*  --   --   HGB 11.5* 11.2* 12.6 12.6 14.3 12.2  HCT 33.7* 33.5* 37.0 37.0 42.0 36.9  MCV 72.9* 74.8*  --  73.9*  --  73.9*  PLT 210 235  --  244  --  268   Cardiac Enzymes: No results found for this basename: CKTOTAL, CKMB, CKMBINDEX, TROPONINI,  in the last 168 hours BNP: No components found with this basename: POCBNP,  CBG:  Recent Labs Lab 08/29/13 0831 08/30/13 0754 08/31/13 0716  GLUCAP 132* 122* 108*    Recent Results (from the past 240 hour(s))  MRSA PCR SCREENING     Status: None   Collection Time    08/28/13  7:59 PM      Result Value Ref Range Status   MRSA by PCR NEGATIVE  NEGATIVE Final     Studies: No results found.  Scheduled  Meds: . amLODipine  10 mg Oral Daily  . cloNIDine  0.2 mg Oral TID  . famotidine  20 mg Oral Daily  . hydrALAZINE  50 mg Oral 3 times per day  . isosorbide mononitrate  30 mg Oral Daily  . sodium chloride  3 mL Intravenous Q12H   Continuous Infusions: . sodium chloride

## 2013-08-31 NOTE — Consult Note (Signed)
Patient ID: Rosanne Sack MRN: [id number] DOB/AGE: 12-09-1970 43 y.o.  Admit date: 08/28/2013 Referring Physician: Leisa Lenz Primary Physician: [name] Primary Cardiologist: None Reason for Consultation: Assistance in management of HTN  HPI: 43 yo female with history of HTN, sickle cell trait admitted to [facility] from primary care office on 08/28/13 with HTN emergency, SBP over 200 and tongue swelling due to possible reaction to Lisinopril. She was treated for IV steroids and had no compromise of airway. Over the last 72 hours, BP has been difficult to control despite addition of Norvasc, clonidine, hydralazine and Imdur. Her home meds included HCTZ and Lisinopril although she was only diagnosed with HTN one week ago and this medication regimen did not control her BP well.   Her only complaint today is headache. Feels like a pressure across her forehead. No chest pain or SOB. No LE edema. She has undergone a CT of the abdomen/pelvis today to exclude an adrenal mass and results are pending.    Past Medical History  Diagnosis Date  . Sickle cell trait   . Hypertension   . Tobacco abuse     Stopped smoking 08/24/13    Family History  Problem Relation Age of Onset  . Cancer Mother     lung & cervical  . Hypertension Mother     History   Social History  . Marital Status: Single    Spouse Name: N/A    Number of Children: N/A  . Years of Education: N/A   Occupational History  . Not on file.   Social History Main Topics  . Smoking status: Former Smoker -- 20 years    Quit date: 08/24/2013  . Smokeless tobacco: Never Used  . Alcohol Use: Yes     Comment: occasional  . Drug Use: No  . Sexual Activity: Not on file   Other Topics Concern  . Not on file   Social History Narrative  . No narrative on file    Past Surgical History  Procedure Laterality Date  . Tubal ligation      2003  . Breast biopsy Left 2011    Allergies  Allergen  Reactions  . Ace Inhibitors Swelling    Tongue swelling  . Lisinopril Swelling   Home Medications: Prescriptions prior to admission  Medication Sig Dispense Refill  . ibuprofen (ADVIL,MOTRIN) 200 MG tablet Take 400 mg by mouth every 4 (four) hours as needed for moderate pain.       . hydrochlorothiazide (HYDRODIURIL) 25 MG tablet Take 1 tablet (25 mg total) by mouth daily.  30 tablet  0  . lisinopril (PRINIVIL,ZESTRIL) 20 MG tablet Take 20 mg by mouth daily.       Inpatient Meds:  Current Facility-Administered Medications  Medication Dose Route Frequency Provider Last Rate Last Dose  . 0.9 %  sodium chloride infusion   Intravenous Continuous Robbie Lis, MD      . acetaminophen (TYLENOL) tablet 650 mg  650 mg Oral Q6H PRN Robbie Lis, MD   650 mg at 08/31/13 1027   Or  . acetaminophen (TYLENOL) suppository 650 mg  650 mg Rectal Q6H PRN Robbie Lis, MD      . amLODipine (NORVASC) tablet 10 mg  10 mg Oral Daily Hosie Poisson, MD   10 mg at 08/31/13 1233  . cloNIDine (CATAPRES) tablet 0.2 mg  0.2 mg Oral TID Hosie Poisson, MD   0.2 mg at 08/31/13 1233  . diphenhydrAMINE (  BENADRYL) injection 25 mg  25 mg Intravenous Q6H PRN Robbie Lis, MD      . famotidine (PEPCID) tablet 20 mg  20 mg Oral Daily Robbie Lis, MD   20 mg at 08/31/13 1233  . hydrALAZINE (APRESOLINE) injection 10-20 mg  10-20 mg Intravenous Q6H PRN Dianne Dun, NP   20 mg at 08/31/13 [id number]  . hydrALAZINE (APRESOLINE) tablet 50 mg  50 mg Oral 3 times per day Hosie Poisson, MD   50 mg at 08/31/13 1233  . HYDROcodone-acetaminophen (NORCO/VICODIN) 5-325 MG per tablet 1-2 tablet  1-2 tablet Oral Q4H PRN Robbie Lis, MD   2 tablet at 08/31/13 0802  . isosorbide mononitrate (IMDUR) 24 hr tablet 30 mg  30 mg Oral Daily Hosie Poisson, MD      . morphine 2 MG/ML injection 1 mg  1 mg Intravenous Q4H PRN Robbie Lis, MD   1 mg at 08/31/13 1311  . ondansetron (ZOFRAN) tablet 4 mg  4 mg Oral Q6H PRN Robbie Lis, MD         Or  . ondansetron [facility]) injection 4 mg  4 mg Intravenous Q6H PRN Robbie Lis, MD      . sodium chloride 0.9 % injection 3 mL  3 mL Intravenous Q12H Robbie Lis, MD   3 mL at 08/30/13 2148    Review of systems complete and found to be negative unless listed above  Physical Exam: Blood pressure 125/79, pulse 77, temperature 98.2 F (36.8 C), temperature source Oral, resp. rate 18, height 5\' 5"  (1.651 m), weight 176 lb 9.6 oz (80.105 kg), last menstrual period 08/31/2013, SpO2 98.00%.   General: Well developed, well nourished, NAD  HEENT: OP clear, mucus membranes moist  SKIN: warm, dry. No rashes.  Neuro: No focal deficits  Musculoskeletal: Muscle strength 5/5 all ext  Psychiatric: Mood and affect normal  Neck: No JVD, no carotid bruits, no thyromegaly, no lymphadenopathy.  Lungs:Clear bilaterally, no wheezes, rhonci, crackles  Cardiovascular: Regular rate and rhythm. No murmurs, gallops or rubs.  Abdomen:Soft. Bowel sounds present. Non-tender.  Extremities: No lower extremity edema. Pulses are 2 + in the bilateral DP/PT.  Labs:   Lab Results  Component Value Date   WBC 7.6 08/29/2013   HGB 12.2 08/29/2013   HCT 36.9 08/29/2013   MCV 73.9* 08/29/2013   PLT 268 08/29/2013     Recent Labs Lab 08/29/13 0342  NA 137  K 3.7  CL 101  CO2 20  BUN 11  CREATININE 1.10  CALCIUM 9.2  PROT 7.7  BILITOT 0.3  ALKPHOS 96  ALT 7  AST 14  GLUCOSE 134*   Chest x-ray 08/27/13: Mild cardiomegaly. No confluent airspace opacities in the lungs. No  effusions. No acute bony abnormality.  IMPRESSION:  Cardiomegaly. No active disease.  EKG: 08/27/13: Sinus, LAE, LVH  ASSESSMENT AND PLAN:   1. HTN: BP has been elevated with SBP range 150-183 over the last 24 hours. Most recent BP at 1pm today is 125/79. Agree with current medication regimen.  -Could consider adding Coreg BID for BP control as her HR seems to be mostly in the 70s. It may be a better long term plan to start the Coreg  and stop the Imdur.  -CT pending today to exclude an adrenal mass. - It would be appropriate to obtain renal artery dopplers to exclude renal artery stenosis. I have spoken to the vascular lab here at [facility] and have  made pt NPO at midnight for test in am. Order placed for renal artery duplex.  - Agree with 24 hour urine for fractionated catecholamines and metanephrines to exclude pheochromocytoma. This has been ordered by the primary team.  - I would also arrange an echocardiogram to assess LV size and function, exclude LVH as her EKG appears to have voltage changes c/w LVH. Order placed for echo.   Signed: , 08/31/2013, 3:14 PM

## 2013-09-01 ENCOUNTER — Inpatient Hospital Stay (HOSPITAL_COMMUNITY): Payer: Managed Care, Other (non HMO)

## 2013-09-01 ENCOUNTER — Encounter (HOSPITAL_COMMUNITY): Payer: Self-pay | Admitting: Radiology

## 2013-09-01 DIAGNOSIS — I517 Cardiomegaly: Secondary | ICD-10-CM

## 2013-09-01 DIAGNOSIS — I10 Essential (primary) hypertension: Secondary | ICD-10-CM

## 2013-09-01 LAB — CBC
HCT: 33.2 % — ABNORMAL LOW (ref 36.0–46.0)
Hemoglobin: 11.3 g/dL — ABNORMAL LOW (ref 12.0–15.0)
MCH: 25 pg — ABNORMAL LOW (ref 26.0–34.0)
MCHC: 34 g/dL (ref 30.0–36.0)
MCV: 73.5 fL — ABNORMAL LOW (ref 78.0–100.0)
PLATELETS: 205 10*3/uL (ref 150–400)
RBC: 4.52 MIL/uL (ref 3.87–5.11)
RDW: 16.5 % — AB (ref 11.5–15.5)
WBC: 5.4 10*3/uL (ref 4.0–10.5)

## 2013-09-01 LAB — BASIC METABOLIC PANEL
BUN: 11 mg/dL (ref 6–23)
CALCIUM: 9 mg/dL (ref 8.4–10.5)
CHLORIDE: 106 meq/L (ref 96–112)
CO2: 23 mEq/L (ref 19–32)
CREATININE: 1.14 mg/dL — AB (ref 0.50–1.10)
GFR calc non Af Amer: 58 mL/min — ABNORMAL LOW (ref >90)
GFR, EST AFRICAN AMERICAN: 67 mL/min — AB (ref >90)
Glucose, Bld: 104 mg/dL — ABNORMAL HIGH (ref 70–99)
Potassium: 3.6 mEq/L — ABNORMAL LOW (ref 3.7–5.3)
Sodium: 140 mEq/L (ref 137–147)

## 2013-09-01 LAB — GLUCOSE, CAPILLARY: GLUCOSE-CAPILLARY: 101 mg/dL — AB (ref 70–99)

## 2013-09-01 MED ORDER — POTASSIUM CHLORIDE CRYS ER 20 MEQ PO TBCR
40.0000 meq | EXTENDED_RELEASE_TABLET | Freq: Once | ORAL | Status: AC
  Administered 2013-09-01: 40 meq via ORAL
  Filled 2013-09-01: qty 2

## 2013-09-01 MED ORDER — HYDROMORPHONE HCL PF 1 MG/ML IJ SOLN: 1.0000 mg | INTRAMUSCULAR | Status: DC | PRN

## 2013-09-01 MED ORDER — BUTALBITAL-APAP-CAFFEINE 50-325-40 MG PO TABS
1.0000 | ORAL_TABLET | ORAL | Status: DC | PRN
  Administered 2013-09-02: 1 via ORAL
  Filled 2013-09-01: qty 1

## 2013-09-01 NOTE — Progress Notes (Signed)
VASCULAR LAB PRELIMINARY  PRELIMINARY  PRELIMINARY  PRELIMINARY  Renal artery duplex completed.    Preliminary report:  No evidence of renal artery stenosis.  , , RVT 09/01/2013, 5:11 PM

## 2013-09-01 NOTE — Progress Notes (Signed)
Echo Lab  2D Echocardiogram completed.  [place], RDCS 09/01/2013 9:43 AM

## 2013-09-01 NOTE — Progress Notes (Signed)
Patient ID: Yolanda Jennings, female   DOB: Sep 16, 1970, 43 y.o.   MRN: [id number] [facility] PROGRESS NOTE  Yolanda Jennings YIR:[id number] DOB: [birth year] DOA: 08/28/2013 PCP: [name], MD  Brief narrative: 43 year old female with past medical history of hypertension who presented from PCP to [facility] ED 08/28/2013 with acute onset tongue swelling. She was recently put on lisinopril and during her regular check up at PCP her tongue started to swell in addition to having difficulty to swallow.   In ED, BP was 190/110 but she did not receive BP meds in ED. She did get solumedrol, benadryl and Pepcid. Hospital course is complicated due to ongoing accelerated hypertension even with multiple antihypertensives. Cardiology asked to help with management of hypertension.   Assessment and Plan:  Principal Problem:  Angioedema  - secondary to lisinopril  - Has received Solu-Medrol, benadryl and Pepcid  - She was then on Solu-Medrol 60 mg every 12 hours IV. Her swelling has improved significantly.  Active Problems:  Accelerated hypertension  - on multiple medications: Norvasc, hydralazine, clonidine and Imdur  - BP still ranging 140-160's/80-90's - CT abdomen with no acute findings to explain HTN (no adrenal masses noted) - plan for renal artery doppler to exclude RAS - rule out pheochromocytoma or possible RAS - appreciate cardio consult  - 24 hour urine for fractionated catecholamines and metanephrines to exclude pheochromocytoma still pending  - 2 D ECHO ordered Acute renal failure - unclear etiology - close monitoring - repeat BMP in AM Hypokalemia - supplement with K-dur, 40 MEQ once PO  - repeat BMP in AM Mild Microcytic anemia - no signs of active bleeding, Hg overall fairly stable - CBC in AM  Studies: Ct Abdomen Pelvis W Contrast  08/31/2013   No acute findings.  No adrenal lesion.    Code Status: Full Family Communication: Pt at bedside  Disposition Plan: Remains  inpatient   Leisa Lenz, MD  [facility] Pager [phone]  If 7PM-7AM, please contact night-coverage [email or web address] Password [email or web address] 09/01/2013, 12:16 PM   LOS: 4 days    Consultants:  Cardiology   Antibiotics:  None   HPI/Subjective: No events overnight.   Objective: Filed Vitals:   09/01/13 0522 09/01/13 0803 09/01/13 1040 09/01/13 1130  BP: 172/89 171/94 163/93 145/78  Pulse: 73 84 79 68  Temp: 98 F (36.7 C)  97.4 F (36.3 C)   TempSrc: Oral  Oral   Resp: 16  16   Height:      Weight:      SpO2: 100%  99%     Intake/Output Summary (Last 24 hours) at 09/01/13 1216 Last data filed at 08/31/13 1823  Gross per 24 hour  Intake    240 ml  Output      0 ml  Net    240 ml    Exam:   General:  Pt is alert, follows commands appropriately, not in acute distress  Cardiovascular: Regular rate and rhythm, S1/S2, no murmurs, no rubs, no gallops  Respiratory: Clear to auscultation bilaterally, no wheezing, no crackles, no rhonchi  Abdomen: Soft, non tender, non distended, bowel sounds present, no guarding  Extremities: No edema, pulses DP and PT palpable bilaterally  Neuro: Grossly nonfocal  Data Reviewed: Basic Metabolic Panel:  Recent Labs Lab 08/27/13 0221 08/28/13 1808 08/28/13 1817 08/29/13 0342 09/01/13 1123  NA 140 139 142 137 140  K 3.1* 4.1 3.8 3.7 3.6*  CL 114* 104 107 101 106  CO2  --  20  --  20 23  GLUCOSE 117* 89 88 134* 104*  BUN 6 8 7 11 11   CREATININE 1.10 0.95 1.00 1.10 1.14*  CALCIUM  --  9.2  --  9.2 9.0  MG  --  1.9  --   --   --   PHOS  --  2.1*  --   --   --    Liver Function Tests:  Recent Labs Lab 08/28/13 1808 08/29/13 0342  AST 18 14  ALT 7 7  ALKPHOS 101 96  BILITOT 0.5 0.3  PROT 8.1 7.7  ALBUMIN 4.0 3.6   CBC:  Recent Labs Lab 08/27/13 0213 08/27/13 0221 08/28/13 1808 08/28/13 1817 08/29/13 0342 09/01/13 1123  WBC 6.9  --  9.5  --  7.6 5.4  NEUTROABS 4.5  --  8.5*  --   --   --   HGB 11.2*  12.6 12.6 14.3 12.2 11.3*  HCT 33.5* 37.0 37.0 42.0 36.9 33.2*  MCV 74.8*  --  73.9*  --  73.9* 73.5*  PLT 235  --  244  --  268 205   CBG:  Recent Labs Lab 08/29/13 0831 08/30/13 0754 08/31/13 0716 09/01/13 0758  GLUCAP 132* 122* 108* 101*    Recent Results (from the past 240 hour(s))  MRSA PCR SCREENING     Status: None   Collection Time    08/28/13  7:59 PM      Result Value Ref Range Status   MRSA by PCR NEGATIVE  NEGATIVE Final   Comment:            The GeneXpert MRSA Assay (FDA     approved for NASAL specimens     only), is one component of a     comprehensive MRSA colonization     surveillance program. It is not     intended to diagnose MRSA     infection nor to guide or     monitor treatment for     MRSA infections.    Scheduled Meds: . amLODipine  10 mg Oral Daily  . cloNIDine  0.2 mg Oral TID  . hydrALAZINE  50 mg Oral 3 times per day  . isosorbide mononitrate  30 mg Oral Daily   Continuous Infusions: . sodium chloride

## 2013-09-01 NOTE — Progress Notes (Signed)
       Patient Name: Rosanne Sack      SUBJECTIVE:   Past Medical History  Diagnosis Date  . Sickle cell trait   . Hypertension   . Tobacco abuse     Stopped smoking 08/24/13    Scheduled Meds:  Scheduled Meds: . amLODipine  10 mg Oral Daily  . cloNIDine  0.2 mg Oral TID  . famotidine  20 mg Oral Daily  . hydrALAZINE  50 mg Oral 3 times per day  . isosorbide mononitrate  30 mg Oral Daily  . potassium chloride  40 mEq Oral Once  . sodium chloride  3 mL Intravenous Q12H   Continuous Infusions: . sodium chloride      PHYSICAL EXAM Filed Vitals:   09/01/13 0522 09/01/13 0803 09/01/13 1040 09/01/13 1130  BP: 172/89 171/94 163/93 145/78  Pulse: 73 84 79 68  Temp: 98 F (36.7 C)  97.4 F (36.3 C)   TempSrc: Oral  Oral   Resp: 16  16   Height:      Weight:      SpO2: 100%  99%        Intake/Output Summary (Last 24 hours) at 09/01/13 1226 Last data filed at 08/31/13 1823  Gross per 24 hour  Intake    240 ml  Output      0 ml  Net    240 ml    LABS: Basic Metabolic Panel:  Recent Labs Lab 08/27/13 0221  08/28/13 1808 08/28/13 1817 08/29/13 0342 09/01/13 1123  NA 140  --  139 142 137 140  K 3.1*  --  4.1 3.8 3.7 3.6*  CL 114*  --  104 107 101 106  CO2  --   --  20  --  20 23  GLUCOSE 117*  --  89 88 134* 104*  BUN 6  --  8 7 11 11   CREATININE 1.10  --  0.95 1.00 1.10 1.14*  CALCIUM  --   < > 9.2  --  9.2 9.0  MG  --   --  1.9  --   --   --   PHOS  --   --  2.1*  --   --   --   < > = values in this interval not displayed. Cardiac Enzymes: No results found for this basename: CKTOTAL, CKMB, CKMBINDEX, TROPONINI,  in the last 72 hours CBC:  Recent Labs Lab 08/27/13 0213 08/27/13 0221 08/28/13 1808 08/28/13 1817 08/29/13 0342 09/01/13 1123  WBC 6.9  --  9.5  --  7.6 5.4  NEUTROABS 4.5  --  8.5*  --   --   --   HGB 11.2* 12.6 12.6 14.3 12.2 11.3*  HCT 33.5* 37.0 37.0 42.0 36.9 33.2*  MCV 74.8*  --  73.9*  --  73.9* 73.5*  PLT 235   --  244  --  268 205     ASSESSMENT AND PLAN:  Principal Problem:   Angioedema Active Problems:   HTN (hypertension)   Hypertensive urgency  BP improved   Target BPfor now should be about160 will defer management to primary team And sign off  Call for further question Would do overnight O2 Sats to exclude OSA  Signed, Virl Axe MD  09/01/2013

## 2013-09-01 NOTE — Progress Notes (Signed)
Patient 24 hour void initiated at this exact time.  Procedure for urine explained to patient, which she acknowledged having a full understanding.  Call to [facility] in reference to patient current menses yielded ok to proceed.

## 2013-09-02 ENCOUNTER — Telehealth: Payer: Self-pay

## 2013-09-02 DIAGNOSIS — R51 Headache: Secondary | ICD-10-CM

## 2013-09-02 DIAGNOSIS — D509 Iron deficiency anemia, unspecified: Secondary | ICD-10-CM

## 2013-09-02 LAB — BASIC METABOLIC PANEL
BUN: 10 mg/dL (ref 6–23)
CALCIUM: 8.6 mg/dL (ref 8.4–10.5)
CO2: 23 meq/L (ref 19–32)
Chloride: 105 mEq/L (ref 96–112)
Creatinine, Ser: 1.05 mg/dL (ref 0.50–1.10)
GFR calc Af Amer: 74 mL/min — ABNORMAL LOW (ref >90)
GFR calc non Af Amer: 64 mL/min — ABNORMAL LOW (ref >90)
Glucose, Bld: 99 mg/dL (ref 70–99)
POTASSIUM: 4.1 meq/L (ref 3.7–5.3)
SODIUM: 138 meq/L (ref 137–147)

## 2013-09-02 LAB — GLUCOSE, CAPILLARY: GLUCOSE-CAPILLARY: 103 mg/dL — AB (ref 70–99)

## 2013-09-02 LAB — CBC
HCT: 31.8 % — ABNORMAL LOW (ref 36.0–46.0)
Hemoglobin: 10.3 g/dL — ABNORMAL LOW (ref 12.0–15.0)
MCH: 24.1 pg — AB (ref 26.0–34.0)
MCHC: 32.4 g/dL (ref 30.0–36.0)
MCV: 74.5 fL — ABNORMAL LOW (ref 78.0–100.0)
PLATELETS: 249 10*3/uL (ref 150–400)
RBC: 4.27 MIL/uL (ref 3.87–5.11)
RDW: 16.6 % — ABNORMAL HIGH (ref 11.5–15.5)
WBC: 5.5 10*3/uL (ref 4.0–10.5)

## 2013-09-02 MED ORDER — CARVEDILOL 3.125 MG PO TABS
3.1250 mg | ORAL_TABLET | Freq: Two times a day (BID) | ORAL | Status: DC
  Administered 2013-09-02 – 2013-09-03 (×2): 3.125 mg via ORAL
  Filled 2013-09-02 (×4): qty 1

## 2013-09-02 NOTE — Telephone Encounter (Signed)
Pt states that she was sent to the ER by [place] and is still currently there she would now like to speak with someone regarding this. Best# [phone]

## 2013-09-02 NOTE — Telephone Encounter (Signed)
Pt called just to let you know she's still in the Hospital from the allergic reaction from Lisinopril. She knows you wanted her to follow up with you. She's hoping they can get her blood pressure under control soon, her numbers are still elevated.

## 2013-09-02 NOTE — Progress Notes (Signed)
PROGRESS NOTE    Yolanda Jennings [id number] DOB: 1971/02/19 DOA: 08/28/2013 PCP: [name], MD  HPI/Brief narrative 43 year old female patient with no prior history of hypertension, was seen at [facility] long ED on 08/24/13 due to elevated BP of 225/118 mmHg at optometrist office. She was treated with IV labetalol and discharged home on lisinopril 20 mg daily. She was seen again on 08/27/13 with blood pressure 174/101 and leg swelling-no medication changes were made but low salt diet was advised. She presented again on 08/28/13 with complaints of tongue swelling/angioedema attributed to lisinopril and she was admitted for management.   Assessment/Plan:  1. Accelerated hypertension: Recent TSH: 0.725. Renal artery duplex: No evidence of renal artery stenosis. 2-D echo: Normal systolic function and grade 2 diastolic dysfunction. 24-hour urine catecholamine: Results pending. CT abdomen: No acute findings or adrenal masses. Since admission, patient has been on amlodipine, clonidine and hydralazine (doses have been titrated up). Imdur was added on 2/24. Patient complains of mild intermittent headache. Blood pressures mostly range in the 130s-150s/70s to 90s but she has periodic SBP's in 170's. Agree with cardiology recommendations-DC Imdur and start  carvedilol 3.125 mg twice a day. The dose of carvedilol can be gradually titrated up based upon blood pressure response and heart rate and simultaneously hydralazine dose can be gradually decreased to stop so that she can be on limited number of medications. Monitor overnight.  2.  Angioedema: Secondary to lisinopril. Patient was treated with steroids, Benadryl and Pepcid. Resolved. Lisinopril discontinued and patient has been advised that she should not ever use lisinopril or medications belonging to the ACE inhibitor/"pril" group and she verbalizes understanding. 3. Headache: Unclear etiology.? Related to hypertension. It could also be secondary to  nitrates that were started yesterday. Patient is advised to followup with her optometrist to complete evaluation for refractory abnormalities. CT head: Normal 4. Microcytic anemia: Follow CBCs.  Code Status:  Full Family Communication:  none at bedside. Disposition Plan:  possible DC 2/26   Consultants:  Cardiology  Procedures:   None  Antibiotics:   None   Subjective:  mild intermittent headaches. No chest pain or dyspnea. Lip and tongue swelling resolved. No difficulty swallowing or eating.  Objective: Filed Vitals:   09/02/13 0125 09/02/13 0500 09/02/13 0519 09/02/13 1344  BP: 142/70  155/91 130/72  Pulse: 84  63 77  Temp: 99.1 F (37.3 C)  98.1 F (36.7 C) 97.6 F (36.4 C)  TempSrc: Oral  Oral   Resp: 18  18 16   Height:  5\' 5"  (1.651 m)    Weight:  80.65 kg (177 lb 12.8 oz)    SpO2: 99%  100% 100%    Intake/Output Summary (Last 24 hours) at 09/02/13 1437 Last data filed at 09/02/13 0126  Gross per 24 hour  Intake    240 ml  Output      0 ml  Net    240 ml   Filed Weights   08/30/13 0300 08/31/13 0500 09/02/13 0500  Weight: 82.2 kg (181 lb 3.5 oz) 80.105 kg (176 lb 9.6 oz) 80.65 kg (177 lb 12.8 oz)     Exam:  General exam:  pleasant female lying comfortably in bed. Respiratory system: Clear. No increased work of breathing. Cardiovascular system: S1 & S2 heard, RRR. No JVD, murmurs, gallops, clicks or pedal edema. Gastrointestinal system: Abdomen is nondistended, soft and nontender. Normal bowel sounds heard. Central nervous system: Alert and oriented. No focal neurological deficits. Extremities: Symmetric 5 x 5 power.  Data Reviewed: Basic Metabolic Panel:  Recent Labs Lab 08/27/13 0221 08/28/13 1808 08/28/13 1817 08/29/13 0342 09/01/13 1123 09/02/13 0405  NA 140 139 142 137 140 138  K 3.1* 4.1 3.8 3.7 3.6* 4.1  CL 114* 104 107 101 106 105  CO2  --  20  --  20 23 23   GLUCOSE 117* 89 88 134* 104* 99  BUN 6 8 7 11 11 10   CREATININE 1.10  0.95 1.00 1.10 1.14* 1.05  CALCIUM  --  9.2  --  9.2 9.0 8.6  MG  --  1.9  --   --   --   --   PHOS  --  2.1*  --   --   --   --    Liver Function Tests:  Recent Labs Lab 08/28/13 1808 08/29/13 0342  AST 18 14  ALT 7 7  ALKPHOS 101 96  BILITOT 0.5 0.3  PROT 8.1 7.7  ALBUMIN 4.0 3.6   No results found for this basename: LIPASE, AMYLASE,  in the last 168 hours No results found for this basename: AMMONIA,  in the last 168 hours CBC:  Recent Labs Lab 08/27/13 0213  08/28/13 1808 08/28/13 1817 08/29/13 0342 09/01/13 1123 09/02/13 0405  WBC 6.9  --  9.5  --  7.6 5.4 5.5  NEUTROABS 4.5  --  8.5*  --   --   --   --   HGB 11.2*  < > 12.6 14.3 12.2 11.3* 10.3*  HCT 33.5*  < > 37.0 42.0 36.9 33.2* 31.8*  MCV 74.8*  --  73.9*  --  73.9* 73.5* 74.5*  PLT 235  --  244  --  268 205 249  < > = values in this interval not displayed. Cardiac Enzymes: No results found for this basename: CKTOTAL, CKMB, CKMBINDEX, TROPONINI,  in the last 168 hours BNP (last 3 results) No results found for this basename: PROBNP,  in the last 8760 hours CBG:  Recent Labs Lab 08/29/13 0831 08/30/13 0754 08/31/13 0716 09/01/13 0758 09/02/13 0732  GLUCAP 132* 122* 108* 101* 103*    Recent Results (from the past 240 hour(s))  MRSA PCR SCREENING     Status: None   Collection Time    08/28/13  7:59 PM      Result Value Ref Range Status   MRSA by PCR NEGATIVE  NEGATIVE Final   Comment:            The GeneXpert MRSA Assay (FDA     approved for NASAL specimens     only), is one component of a     comprehensive MRSA colonization     surveillance program. It is not     intended to diagnose MRSA     infection nor to guide or     monitor treatment for     MRSA infections.       Studies: Ct Head Wo Contrast  09/01/2013   CLINICAL DATA:  Headache, hypertension  EXAM: CT HEAD WITHOUT CONTRAST  TECHNIQUE: Contiguous axial images were obtained from the base of the skull through the vertex without  intravenous contrast.  COMPARISON:  None.  FINDINGS: Ventricle size is normal. Negative for acute or chronic infarction. Negative for hemorrhage or fluid collection. Negative for mass or edema. No shift of the midline structures.  Calvarium is intact.  IMPRESSION: Normal   Electronically Signed   By: Franchot Gallo M.D.   On: 09/01/2013 13:11   Ct Abdomen  Pelvis W Contrast  08/31/2013   CLINICAL DATA:  Uncontrolled hypertension.  Adrenal mass.  EXAM: CT ABDOMEN AND PELVIS WITH CONTRAST  TECHNIQUE: Multidetector CT imaging of the abdomen and pelvis was performed using the standard protocol following bolus administration of intravenous contrast.  CONTRAST:  158mL OMNIPAQUE IOHEXOL 300 MG/ML  SOLN  COMPARISON:  None available.  FINDINGS: Lung bases show scattered scarring in the lingula and left lower lobe. Heart is mildly enlarged. No pericardial or pleural effusion. Pre pericardiac lymph nodes are small in size.  Low-attenuation lesion in the central portion of the liver measures 1.5 cm and is likely a cyst. Liver, gallbladder, adrenal glands and right kidney are otherwise unremarkable. A 9 mm low-attenuation lesion in the left kidney is too small to characterize but statistically, likely represents a cyst. Spleen, pancreas, stomach, small bowel, colon and appendix are unremarkable. Uterus and ovaries are visualized. No pathologically enlarged lymph nodes. No worrisome lytic or sclerotic lesions.  IMPRESSION: No acute findings.  No adrenal lesion.   Electronically Signed   By: Lorin Picket M.D.   On: 08/31/2013 16:43        Scheduled Meds: . amLODipine  10 mg Oral Daily  . carvedilol  3.125 mg Oral BID WC  . cloNIDine  0.2 mg Oral TID  . famotidine  20 mg Oral Daily  . hydrALAZINE  50 mg Oral 3 times per day  . sodium chloride  3 mL Intravenous Q12H   Continuous Infusions: . sodium chloride      Principal Problem:   Angioedema Active Problems:   HTN (hypertension)   Hypertensive  urgency    Time spent:  78 minutes    ,, MD, FACP, FHM. [facility] Pager [phone]  If 7PM-7AM, please contact night-coverage [email or web address] Password [facility] 09/02/2013, 2:37 PM    LOS: 5 days

## 2013-09-03 DIAGNOSIS — D509 Iron deficiency anemia, unspecified: Secondary | ICD-10-CM

## 2013-09-03 DIAGNOSIS — R519 Headache, unspecified: Secondary | ICD-10-CM

## 2013-09-03 DIAGNOSIS — R51 Headache: Secondary | ICD-10-CM

## 2013-09-03 LAB — GLUCOSE, CAPILLARY: GLUCOSE-CAPILLARY: 102 mg/dL — AB (ref 70–99)

## 2013-09-03 LAB — CBC
HCT: 28.4 % — ABNORMAL LOW (ref 36.0–46.0)
Hemoglobin: 10 g/dL — ABNORMAL LOW (ref 12.0–15.0)
MCH: 26 pg (ref 26.0–34.0)
MCHC: 35.2 g/dL (ref 30.0–36.0)
MCV: 74 fL — ABNORMAL LOW (ref 78.0–100.0)
Platelets: 246 10*3/uL (ref 150–400)
RBC: 3.84 MIL/uL — ABNORMAL LOW (ref 3.87–5.11)
RDW: 16.6 % — ABNORMAL HIGH (ref 11.5–15.5)
WBC: 4.9 10*3/uL (ref 4.0–10.5)

## 2013-09-03 MED ORDER — CARVEDILOL 3.125 MG PO TABS: 3.1250 mg | ORAL_TABLET | Freq: Two times a day (BID) | ORAL | Status: DC

## 2013-09-03 MED ORDER — CLONIDINE HCL 0.1 MG PO TABS
0.1000 mg | ORAL_TABLET | Freq: Two times a day (BID) | ORAL | Status: DC
  Filled 2013-09-03: qty 1

## 2013-09-03 MED ORDER — HYDROCHLOROTHIAZIDE 25 MG PO TABS
25.0000 mg | ORAL_TABLET | Freq: Every day | ORAL | Status: DC
  Administered 2013-09-03: 25 mg via ORAL
  Filled 2013-09-03: qty 1

## 2013-09-03 MED ORDER — AMLODIPINE BESYLATE 10 MG PO TABS: 10.0000 mg | ORAL_TABLET | Freq: Every day | ORAL | Status: DC

## 2013-09-03 MED ORDER — CLONIDINE HCL 0.1 MG PO TABS: 0.1000 mg | ORAL_TABLET | Freq: Two times a day (BID) | ORAL | Status: DC

## 2013-09-03 NOTE — Telephone Encounter (Signed)
I'm sorry to hear she is still there, but appreciate the heads up.  I am out of town until Monday anyway, so next week follow up is fine.

## 2013-09-03 NOTE — Discharge Summary (Addendum)
Physician Discharge Summary  Yolanda Jennings WIO:[id number] DOB: Mar 09, 1971 DOA: 08/28/2013  PCP: [name], MD  Admit date: 08/28/2013 Discharge date: 09/03/2013  Recommendations for Outpatient Follow-up:  1. Follow up with primary care doctor within 1 week of discharge.  Advised patient to get blood pressure cuff and check pressure once daily at random times of the day while at rest, record, and bring to visit.  She should call sooner if she has SBP < 100 or > 160.  Please follow up on pending urine metanephrines/catecholamines.  Add ACEI to allergy list.   Consider decreasing or stopping clonidine if blood pressure low normal.   2. PCP:  Work up for microcytic anemia if not already complete  Discharge Diagnoses:  Principal Problem:   Angioedema Active Problems:   HTN (hypertension)   Hypertensive urgency   Microcytic anemia   Headache(784.0)   Discharge Condition: Stable, improved  Diet recommendation: Healthy heart  Wt Readings from Last 3 Encounters:  09/02/13 80.65 kg (177 lb 12.8 oz)  08/28/13 81.647 kg (180 lb)  08/24/13 81.647 kg (180 lb)    History of present illness:  43 year old female with past medical history of hypertension who presented from PCP to [facility] ED 08/28/2013 with acute onset tongue swelling. She was recently put on lisinopril and during her regular check up at PCP her tongue started to swell in addition to having difficulty to swallow. She is able to protect airways. No fever or chills. No respiratory distress. No cough. No chest pain.   In ED, BP was 190/110 but she did not receive BP meds in ED. She did get solumedrol, benadryl and Pepcid. She feels little better but still reports tongue being swollen. [facility] asked to admit for further evaluation and management.   Hospital Course:  1. Accelerated hypertension:  BPs may have been elevated secondary to stress from angioedema.  Recent TSH: 0.725. Renal artery duplex: No evidence of renal artery stenosis.  2-D echo: Normal systolic function and grade 2 diastolic dysfunction. 24-hour urine catecholamine/metanephrines: Results pending.  Renin:aldosterone ratio not obtained during this admission.  CT abdomen: No acute findings or adrenal masses. Since admission, patient was started on amlodipine, clonidine and hydralazine. Imdur was added on 2/24. Patient complained of mild intermittent headache.  Imdur was discontinued and she was started on carvedilol 3.125 mg twice a day.  Her blood pressures trended down dramatically, some of which was likely due to the addition of new medications and some of which may have been due to improvement in her angioedema.  Advised her that she may be at risk of low blood pressures at home.  I have discontinued her hydralazine and reduced her clonidine.  I advised her to continue her HCTZ to simplify her regimen.  She should also call her doctor if she develops lightheadedness, dizziness, or systolic blood pressures less than 100.   2. Angioedema: Secondary to lisinopril. Patient was treated with steroids, Benadryl and Pepcid. Resolved. Lisinopril discontinued and patient has been advised that she should not ever use lisinopril or medications belonging to the ACE inhibitor/"pril" group and she verbalizes understanding. 3. Headache: Unclear etiology. May have been related to hypertension, nitrates, or sinus swelling from her angioedema. Patient is advised to followup with her optometrist to complete evaluation for refractory abnormalities, particularly if symptoms persist. CT head: Normal 4. Microcytic anemia:  Defer further evaluation to PCP.  Hemoglobin remained stable around 10mg /dl.    Consultants:  Cardiology Procedures:  CT abd/pelvis ECHO Renal artery  duplex   Antibiotics:  None    Discharge Exam: Filed Vitals:   09/03/13 1107  BP: 127/72  Pulse: 68  Temp:   Resp:    Filed Vitals:   09/02/13 1344 09/02/13 2200 09/03/13 0646 09/03/13 1107  BP: 130/72 143/79  110/68 127/72  Pulse: 77 72 70 68  Temp: 97.6 F (36.4 C) 98.8 F (37.1 C) 98.5 F (36.9 C)   TempSrc:      Resp: 16 18 18    Height:      Weight:      SpO2: 100% 100% 99%     General: BF, NAD HEENT:  NCAT, MMM, no lip or tongue swelling Cardiovascular: RRR, no mrg, 2+ pulses Respiratory: CTAB ABD:  NABS, soft, ND/NT MSK:  No LEE, normal tone and bulk Neuro:  Grossly intact  Discharge Instructions      Discharge Orders   Future Orders Complete By Expires   Call MD for:  difficulty breathing, headache or visual disturbances  As directed    Call MD for:  extreme fatigue  As directed    Call MD for:  hives  As directed    Call MD for:  persistant dizziness or light-headedness  As directed    Call MD for:  persistant nausea and vomiting  As directed    Call MD for:  severe uncontrolled pain  As directed    Call MD for:  temperature >100.4  As directed    Diet - low sodium heart healthy  As directed    Discharge instructions  As directed    Comments:     You were hospitalized with angioedema, or swelling of the face which can be caused by lisinopril and all of the other medications in the ACE inhibitor family.  Please do not take any medications which end in "-pril."  Your blood pressure was quite high when you arrived at the hospital and you were started on several blood pressure medications.  Your blood pressure came down more quickly than we would expect with these medications and this may be a sign that your blood pressure was high because your face swelling and stress.  I have reduced the doses of your blood pressure medications to reduce that chance of low blood pressure.  Please get a blood pressure cuff and check your blood pressure before your take your medications in the morning and then once more randomly during the day.  Record them and take the numbers with you to your primary care doctor in one week.  If your systolic blood pressure (top number) goes higher than 160,  please call your doctor so that adjustments can be made to your medication doses immedicate.  If your systolic blood pressure (top number) goes lower than 100, please do not take your blood pressure medications and call your primary care doctor.   Increase activity slowly  As directed        Medication List    STOP taking these medications       lisinopril 20 MG tablet  Commonly known as:  PRINIVIL,ZESTRIL      TAKE these medications       amLODipine 10 MG tablet  Commonly known as:  NORVASC  Take 1 tablet (10 mg total) by mouth daily.     carvedilol 3.125 MG tablet  Commonly known as:  COREG  Take 1 tablet (3.125 mg total) by mouth 2 (two) times daily with a meal.     cloNIDine 0.1 MG tablet  Commonly known as:  CATAPRES  Take 1 tablet (0.1 mg total) by mouth 2 (two) times daily.     hydrochlorothiazide 25 MG tablet  Commonly known as:  HYDRODIURIL  Take 1 tablet (25 mg total) by mouth daily.     ibuprofen 200 MG tablet  Commonly known as:  ADVIL,MOTRIN  Take 400 mg by mouth every 4 (four) hours as needed for moderate pain.       Follow-up Information   Follow up with GREENE,JEFFREY R, MD. Schedule an appointment as soon as possible for a visit in 1 week.   Specialties:  Family Medicine, Sports Medicine   Contact information:   [address] [place] [id number] [phone]        The results of significant diagnostics from this hospitalization (including imaging, microbiology, ancillary and laboratory) are listed below for reference.    Significant Diagnostic Studies: Dg Chest 2 View  08/27/2013   CLINICAL DATA:  Hypertension.  EXAM: CHEST  2 VIEW  COMPARISON:  12/21/2009  FINDINGS: Mild cardiomegaly. No confluent airspace opacities in the lungs. No effusions. No acute bony abnormality.  IMPRESSION: Cardiomegaly.  No active disease.   Electronically Signed   By: Rolm Baptise M.D.   On: 08/27/2013 02:26   Ct Head Wo Contrast  09/01/2013   CLINICAL DATA:   Headache, hypertension  EXAM: CT HEAD WITHOUT CONTRAST  TECHNIQUE: Contiguous axial images were obtained from the base of the skull through the vertex without intravenous contrast.  COMPARISON:  None.  FINDINGS: Ventricle size is normal. Negative for acute or chronic infarction. Negative for hemorrhage or fluid collection. Negative for mass or edema. No shift of the midline structures.  Calvarium is intact.  IMPRESSION: Normal   Electronically Signed   By: Franchot Gallo M.D.   On: 09/01/2013 13:11   Ct Abdomen Pelvis W Contrast  08/31/2013   CLINICAL DATA:  Uncontrolled hypertension.  Adrenal mass.  EXAM: CT ABDOMEN AND PELVIS WITH CONTRAST  TECHNIQUE: Multidetector CT imaging of the abdomen and pelvis was performed using the standard protocol following bolus administration of intravenous contrast.  CONTRAST:  13mL OMNIPAQUE IOHEXOL 300 MG/ML  SOLN  COMPARISON:  None available.  FINDINGS: Lung bases show scattered scarring in the lingula and left lower lobe. Heart is mildly enlarged. No pericardial or pleural effusion. Pre pericardiac lymph nodes are small in size.  Low-attenuation lesion in the central portion of the liver measures 1.5 cm and is likely a cyst. Liver, gallbladder, adrenal glands and right kidney are otherwise unremarkable. A 9 mm low-attenuation lesion in the left kidney is too small to characterize but statistically, likely represents a cyst. Spleen, pancreas, stomach, small bowel, colon and appendix are unremarkable. Uterus and ovaries are visualized. No pathologically enlarged lymph nodes. No worrisome lytic or sclerotic lesions.  IMPRESSION: No acute findings.  No adrenal lesion.   Electronically Signed   By: Lorin Picket M.D.   On: 08/31/2013 16:43    Microbiology: Recent Results (from the past 240 hour(s))  MRSA PCR SCREENING     Status: None   Collection Time    08/28/13  7:59 PM      Result Value Ref Range Status   MRSA by PCR NEGATIVE  NEGATIVE Final   Comment:             The GeneXpert MRSA Assay (FDA     approved for NASAL specimens     only), is one component of a     comprehensive  MRSA colonization     surveillance program. It is not     intended to diagnose MRSA     infection nor to guide or     monitor treatment for     MRSA infections.     Labs: Basic Metabolic Panel:  Recent Labs Lab 08/28/13 1808 08/28/13 1817 08/29/13 0342 09/01/13 1123 09/02/13 0405  NA 139 142 137 140 138  K 4.1 3.8 3.7 3.6* 4.1  CL 104 107 101 106 105  CO2 20  --  20 23 23   GLUCOSE 89 88 134* 104* 99  BUN 8 7 11 11 10   CREATININE 0.95 1.00 1.10 1.14* 1.05  CALCIUM 9.2  --  9.2 9.0 8.6  MG 1.9  --   --   --   --   PHOS 2.1*  --   --   --   --    Liver Function Tests:  Recent Labs Lab 08/28/13 1808 08/29/13 0342  AST 18 14  ALT 7 7  ALKPHOS 101 96  BILITOT 0.5 0.3  PROT 8.1 7.7  ALBUMIN 4.0 3.6   No results found for this basename: LIPASE, AMYLASE,  in the last 168 hours No results found for this basename: AMMONIA,  in the last 168 hours CBC:  Recent Labs Lab 08/28/13 1808 08/28/13 1817 08/29/13 0342 09/01/13 1123 09/02/13 0405 09/03/13 0334  WBC 9.5  --  7.6 5.4 5.5 4.9  NEUTROABS 8.5*  --   --   --   --   --   HGB 12.6 14.3 12.2 11.3* 10.3* 10.0*  HCT 37.0 42.0 36.9 33.2* 31.8* 28.4*  MCV 73.9*  --  73.9* 73.5* 74.5* 74.0*  PLT 244  --  268 205 249 246   Cardiac Enzymes: No results found for this basename: CKTOTAL, CKMB, CKMBINDEX, TROPONINI,  in the last 168 hours BNP: BNP (last 3 results) No results found for this basename: PROBNP,  in the last 8760 hours CBG:  Recent Labs Lab 08/30/13 0754 08/31/13 0716 09/01/13 0758 09/02/13 0732 09/03/13 0810  GLUCAP 122* 108* 101* 103* 102*    Time coordinating discharge: 45 minutes  Signed:  ,   [facility] 09/03/2013, 11:22 AM

## 2013-09-03 NOTE — Progress Notes (Signed)
NCM spoke to pt and her son lives in the home. No NCM needs identified. Jonnie Finner RN CCM Case Mgmt phone [phone]

## 2013-09-04 ENCOUNTER — Emergency Department (HOSPITAL_COMMUNITY)
Admission: EM | Admit: 2013-09-04 | Discharge: 2013-09-04 | Disposition: A | Discharge status: Home / Self Care | Payer: Managed Care, Other (non HMO) | Attending: Emergency Medicine | Admitting: Emergency Medicine

## 2013-09-04 ENCOUNTER — Encounter (HOSPITAL_COMMUNITY): Payer: Self-pay | Admitting: Emergency Medicine

## 2013-09-04 DIAGNOSIS — R51 Headache: Secondary | ICD-10-CM | POA: Insufficient documentation

## 2013-09-04 DIAGNOSIS — Z79899 Other long term (current) drug therapy: Secondary | ICD-10-CM | POA: Insufficient documentation

## 2013-09-04 DIAGNOSIS — Z87891 Personal history of nicotine dependence: Secondary | ICD-10-CM | POA: Insufficient documentation

## 2013-09-04 DIAGNOSIS — Z862 Personal history of diseases of the blood and blood-forming organs and certain disorders involving the immune mechanism: Secondary | ICD-10-CM | POA: Insufficient documentation

## 2013-09-04 DIAGNOSIS — I10 Essential (primary) hypertension: Secondary | ICD-10-CM | POA: Insufficient documentation

## 2013-09-04 LAB — METANEPHRINES, URINE, 24 HOUR
METANEPH TOTAL UR: 785 ug/(24.h) — AB (ref 182–739)
METANEPHRINES UR: 262 ug/(24.h) — AB (ref 58–203)
NORMETANEPHRINE 24H UR: 523 ug/(24.h) (ref 88–649)
VOLUME, URINE-METAN: 950 mL

## 2013-09-04 LAB — CATECHOLAMINES, FRACTIONATED, PLASMA
EPINEPHRINE: 237 pg/mL
NOREPINEPHRINE: 941 pg/mL
TOTAL CATECHOLAMINES(NOR+ EPI): 1178 pg/mL

## 2013-09-04 MED ORDER — CLONIDINE HCL 0.1 MG PO TABS
0.1000 mg | ORAL_TABLET | Freq: Once | ORAL | Status: AC
  Administered 2013-09-04: 0.1 mg via ORAL
  Filled 2013-09-04: qty 1

## 2013-09-04 MED ORDER — CLONIDINE HCL 0.1 MG PO TABS: 0.1000 mg | ORAL_TABLET | Freq: Three times a day (TID) | ORAL | Status: DC

## 2013-09-04 NOTE — ED Notes (Signed)
Patient is alert and oriented x3.  She was given DC instructions and follow up visit instructions.  Patient gave verbal understanding. She was DC ambulatory under her own power to home.  V/S stable.  He was not showing any signs of distress on DC 

## 2013-09-04 NOTE — ED Notes (Signed)
Bed: PP50 Expected date: 09/04/13 Expected time: 2:18 AM Means of arrival: Ambulance Comments: hypertension

## 2013-09-04 NOTE — Discharge Instructions (Signed)
Please increase your clonidine to three times a day.  Contact your doctor's office today to schedule follow up.     DASH Diet The DASH diet stands for "Dietary Approaches to Stop Hypertension." It is a healthy eating plan that has been shown to reduce high blood pressure (hypertension) in as little as 14 days, while also possibly providing other significant health benefits. These other health benefits include reducing the risk of breast cancer after menopause and reducing the risk of type 2 diabetes, heart disease, colon cancer, and stroke. Health benefits also include weight loss and slowing kidney failure in patients with chronic kidney disease.  DIET GUIDELINES  Limit salt (sodium). Your diet should contain less than 1500 mg of sodium daily.  Limit refined or processed carbohydrates. Your diet should include mostly whole grains. Desserts and added sugars should be used sparingly.  Include small amounts of heart-healthy fats. These types of fats include nuts, oils, and tub margarine. Limit saturated and trans fats. These fats have been shown to be harmful in the body. CHOOSING FOODS  The following food groups are based on a 2000 calorie diet. See your Registered Dietitian for individual calorie needs. Grains and Grain Products (6 to 8 servings daily)  Eat More Often: Whole-wheat bread, brown rice, whole-grain or wheat pasta, quinoa, popcorn without added fat or salt (air popped).  Eat Less Often: White bread, white pasta, white rice, cornbread. Vegetables (4 to 5 servings daily)  Eat More Often: Fresh, frozen, and canned vegetables. Vegetables may be raw, steamed, roasted, or grilled with a minimal amount of fat.  Eat Less Often/Avoid: Creamed or fried vegetables. Vegetables in a cheese sauce. Fruit (4 to 5 servings daily)  Eat More Often: All fresh, canned (in natural juice), or frozen fruits. Dried fruits without added sugar. One hundred percent fruit juice ( cup [237 mL]  daily).  Eat Less Often: Dried fruits with added sugar. Canned fruit in light or heavy syrup. [employer], Fish, and Poultry (2 servings or less daily. One serving is 3 to 4 oz [85-114 g]).  Eat More Often: Ninety percent or leaner ground beef, tenderloin, sirloin. Round cuts of beef, chicken breast, [place] breast. All fish. Grill, bake, or broil your meat. Nothing should be fried.  Eat Less Often/Avoid: Fatty cuts of meat, [place], or chicken leg, thigh, or wing. Fried cuts of meat or fish. Dairy (2 to 3 servings)  Eat More Often: Low-fat or fat-free milk, low-fat plain or light yogurt, reduced-fat or part-skim cheese.  Eat Less Often/Avoid: Milk (whole, 2%).Whole milk yogurt. Full-fat cheeses. Nuts, Seeds, and Legumes (4 to 5 servings per week)  Eat More Often: All without added salt.  Eat Less Often/Avoid: Salted nuts and seeds, canned beans with added salt. Fats and Sweets (limited)  Eat More Often: Vegetable oils, tub margarines without trans fats, sugar-free gelatin. Mayonnaise and salad dressings.  Eat Less Often/Avoid: Coconut oils, palm oils, butter, stick margarine, cream, half and half, cookies, candy, pie. FOR MORE INFORMATION The Dash Diet Eating Plan: www.dashdiet.org Document Released: 06/14/2011 Document Revised: 09/17/2011 Document Reviewed: 06/14/2011 [facility] Patient Information 2014 [place], [place].  How to Take Your Blood Pressure  These instructions are only for electronic home blood pressure machines. You will need:   An automatic or semi-automatic blood pressure machine.  Fresh batteries for the blood pressure machine. HOW DO I USE THESE TOOLS TO CHECK MY BLOOD PRESSURE?   There are 2 numbers that make up your blood pressure. For example: 120/80.  The first number (120 in our example) is called the "systolic pressure." It is a measure of the pressure in your blood vessels when your heart is pumping blood.  The second number (80 in our example) is called  the "diastolic pressure." It is a measure of the pressure in your blood vessels when your heart is resting between beats.  Before you buy a home blood pressure machine, check the size of your arm so you can buy the right size cuff. Here is how to check the size of your arm:  Use a tape measure that shows both inches and centimeters.  Wrap the tape measure around the middle upper part of your arm. You may need someone to help you measure right.  Write down your arm measurement in both inches and centimeters.  To measure your blood pressure right, it is important to have the right size cuff.  If your arm is up to 13 inches (37 to 34 centimeters), get an adult cuff size.  If your arm is 13 to 17 inches (35 to 44 centimeters), get a large adult cuff size.  If your arm is 17 to 20 inches (45 to 52 centimeters), get an adult thigh cuff.  Try to rest or relax for at least 30 minutes before you check your blood pressure.  Do not smoke.  Do not have any drinks with caffeine, such as:  Pop.  Coffee.  Tea.  Check your blood pressure in a quiet room.  Sit down and stretch out your arm on a table. Keep your arm at about the level of your heart. Let your arm relax. GETTING BLOOD PRESSURE READINGS  Make sure you remove any tight-fighting clothing from your arm. Wrap the cuff around your upper arm. Wrap it just above the bend, and above where you felt the pulse. You should be able to slip a finger between the cuff and your arm. If you cannot slip a finger in the cuff, it is too tight and should be removed and rewrapped.  Some units requires you to manually pump up the arm cuff.  Automatic units inflate the cuff when you press a button.  Cuff deflation is automatic in both models.  After the cuff is inflated, the unit measures your blood pressure and pulse. The readings are displayed on a monitor. Hold still and breathe normally while the cuff is inflated.  Getting a reading takes less  than a minute.  Some models store readings in a memory. Some provide a printout of readings.  Get readings at different times of the day. You should wait at least 5 minutes between readings. Take readings with you to your next doctor's visit. Document Released: 06/07/2008 Document Revised: 09/17/2011 Document Reviewed: 06/07/2008 [facility] Patient Information 2014 [place].  Managing Your High Blood Pressure Blood pressure is a measurement of how forceful your blood is pressing against the walls of the arteries. Arteries are muscular tubes within the circulatory system. Blood pressure does not stay the same. Blood pressure rises when you are active, excited, or nervous; and it lowers during sleep and relaxation. If the numbers measuring your blood pressure stay above normal most of the time, you are at risk for health problems. High blood pressure (hypertension) is a long-term (chronic) condition in which blood pressure is elevated. A blood pressure reading is recorded as two numbers, such as 120 over 80 (or 120/80). The first, higher number is called the systolic pressure. It is a measure of the pressure  in your arteries as the heart beats. The second, lower number is called the diastolic pressure. It is a measure of the pressure in your arteries as the heart relaxes between beats.  Keeping your blood pressure in a normal range is important to your overall health and prevention of health problems, such as heart disease and stroke. When your blood pressure is uncontrolled, your heart has to work harder than normal. High blood pressure is a very common condition in adults because blood pressure tends to rise with age. Men and women are equally likely to have hypertension but at different times in life. Before age 59, men are more likely to have hypertension. After 44 years of age, women are more likely to have it. Hypertension is especially common in African Americans. This condition often has no  signs or symptoms. The cause of the condition is usually not known. Your caregiver can help you come up with a plan to keep your blood pressure in a normal, healthy range. BLOOD PRESSURE STAGES Blood pressure is classified into four stages: normal, prehypertension, stage 1, and stage 2. Your blood pressure reading will be used to determine what type of treatment, if any, is necessary. Appropriate treatment options are tied to these four stages:  Normal  Systolic pressure (mm Hg): below 120.  Diastolic pressure (mm Hg): below 80. Prehypertension  Systolic pressure (mm Hg): 120 to 139.  Diastolic pressure (mm Hg): 80 to 89. Stage1  Systolic pressure (mm Hg): 140 to 159.  Diastolic pressure (mm Hg): 90 to 99. Stage2  Systolic pressure (mm Hg): 160 or above.  Diastolic pressure (mm Hg): 100 or above. RISKS RELATED TO HIGH BLOOD PRESSURE Managing your blood pressure is an important responsibility. Uncontrolled high blood pressure can lead to:  A heart attack.  A stroke.  A weakened blood vessel (aneurysm).  Heart failure.  Kidney damage.  Eye damage.  Metabolic syndrome.  Memory and concentration problems. HOW TO MANAGE YOUR BLOOD PRESSURE Blood pressure can be managed effectively with lifestyle changes and medicines (if needed). Your caregiver will help you come up with a plan to bring your blood pressure within a normal range. Your plan should include the following: Education  Read all information provided by your caregivers about how to control blood pressure.  Educate yourself on the latest guidelines and treatment recommendations. New research is always being done to further define the risks and treatments for high blood pressure. Lifestylechanges  Control your weight.  Avoid smoking.  Stay physically active.  Reduce the amount of salt in your diet.  Reduce stress.  Control any chronic conditions, such as high cholesterol or diabetes.  Reduce your  alcohol intake. Medicines  Several medicines (antihypertensive medicines) are available, if needed, to bring blood pressure within a normal range. Communication  Review all the medicines you take with your caregiver because there may be side effects or interactions.  Talk with your caregiver about your diet, exercise habits, and other lifestyle factors that may be contributing to high blood pressure.  See your caregiver regularly. Your caregiver can help you create and adjust your plan for managing high blood pressure. RECOMMENDATIONS FOR TREATMENT AND FOLLOW-UP  The following recommendations are based on current guidelines for managing high blood pressure in nonpregnant adults. Use these recommendations to identify the proper follow-up period or treatment option based on your blood pressure reading. You can discuss these options with your caregiver.  Systolic pressure of 123456 to [id number] or diastolic pressure of 80 to  89: Follow up with your caregiver as directed.  Systolic pressure of 563 to 893 or diastolic pressure of 90 to 100: Follow up with your caregiver within 2 months.  Systolic pressure above 734 or diastolic pressure above 287: Follow up with your caregiver within 1 month.  Systolic pressure above 681 or diastolic pressure above 157: Consider antihypertensive therapy; follow up with your caregiver within 1 week.  Systolic pressure above 262 or diastolic pressure above 035: Begin antihypertensive therapy; follow up with your caregiver within 1 week. Document Released: 03/19/2012 Document Reviewed: 03/19/2012 [facility] Patient Information 2014 [place], [place].

## 2013-09-04 NOTE — ED Notes (Signed)
Patient is alert and oriented x3.  She is complaining of a headache related to high blood pressure.  The patient was DC from the hospital yesterday after having issues getting her BP under control.

## 2013-09-04 NOTE — ED Provider Notes (Signed)
CSN: [id number]     Arrival date & time 09/04/13  0230 History   First MD Initiated Contact with Patient 09/04/13 0239     Chief Complaint  Patient presents with  . Hypertension     (Consider location/radiation/quality/duration/timing/severity/associated sxs/prior Treatment) HPI 43 yo female presents to the ER from home via EMS with elevated BP and mild HA.  Pt was d/c yesterday afternoon after admission for hypertensive urgency and angioedema.  Pt recently dx with HTN, and had two ED visits for HTN without end organ damage indication.  After starting lisinopril, she developed angioedema.  While admitted for the past several days, she had extensive htn w/u and cardiology evaluation.  Pt was taken off hydralazine and had her clonidine dosing decreased just prior to d/c home.  Tonight she was taking her BP and it was 180s/100s, prompting her to call 911.  Upon arrival, BP 149/74, rising to 165/100.  Pt reports dull headache which she has had from time to time.  No chest pain, sob, weakness, numbness.  She is taking her bp meds as prescribed. Past Medical History  Diagnosis Date  . Sickle cell trait   . Hypertension   . Tobacco abuse     Stopped smoking 08/24/13   Past Surgical History  Procedure Laterality Date  . Tubal ligation      2003  . Breast biopsy Left 2011   Family History  Problem Relation Age of Onset  . Cancer Mother     lung & cervical  . Hypertension Mother    History  Substance Use Topics  . Smoking status: Former Smoker -- 20 years    Quit date: 08/24/2013  . Smokeless tobacco: Never Used  . Alcohol Use: Yes     Comment: occasional   OB History   Grav Para Term Preterm Abortions TAB SAB Ect Mult Living   2 2 2       2      Review of Systems  All other systems reviewed and are negative.      Allergies  Ace inhibitors and Lisinopril  Home Medications   Current Outpatient Rx  Name  Route  Sig  Dispense  Refill  . amLODipine (NORVASC) 10 MG tablet  Oral   Take 1 tablet (10 mg total) by mouth daily.   30 tablet   0   . carvedilol (COREG) 3.125 MG tablet   Oral   Take 1 tablet (3.125 mg total) by mouth 2 (two) times daily with a meal.   60 tablet   0   . hydrochlorothiazide (HYDRODIURIL) 25 MG tablet   Oral   Take 1 tablet (25 mg total) by mouth daily.   30 tablet   0   . ibuprofen (ADVIL,MOTRIN) 200 MG tablet   Oral   Take 400 mg by mouth every 4 (four) hours as needed for moderate pain.          . cloNIDine (CATAPRES) 0.1 MG tablet   Oral   Take 1 tablet (0.1 mg total) by mouth 3 (three) times daily.   60 tablet   0    BP 149/74  Pulse 76  Temp(Src) 98.6 F (37 C) (Oral)  SpO2 100%  LMP 08/31/2013 Physical Exam  Nursing note and vitals reviewed. Constitutional: She is oriented to person, place, and time. She appears well-developed and well-nourished.  HENT:  Head: Normocephalic and atraumatic.  Nose: Nose normal.  Mouth/Throat: Oropharynx is clear and moist.  Eyes: Conjunctivae and EOM are  normal. Pupils are equal, round, and reactive to light.  Neck: Normal range of motion. Neck supple. No JVD present. No tracheal deviation present. No thyromegaly present.  Cardiovascular: Normal rate, regular rhythm, normal heart sounds and intact distal pulses.  Exam reveals no gallop and no friction rub.   No murmur heard. Pulmonary/Chest: Effort normal and breath sounds normal. No stridor. No respiratory distress. She has no wheezes. She has no rales. She exhibits no tenderness.  Abdominal: Soft. Bowel sounds are normal. She exhibits no distension and no mass. There is no tenderness. There is no rebound and no guarding.  Musculoskeletal: Normal range of motion. She exhibits no edema and no tenderness.  Lymphadenopathy:    She has no cervical adenopathy.  Neurological: She is alert and oriented to person, place, and time. She has normal reflexes. No cranial nerve deficit. She exhibits normal muscle tone. Coordination  normal.  Skin: Skin is warm and dry. No rash noted. No erythema. No pallor.  Psychiatric: She has a normal mood and affect. Her behavior is normal. Judgment and thought content normal.    ED Course  Procedures (including critical care time) Labs Review Labs Reviewed - No data to display Imaging Review No results found.  EKG Interpretation   None       MDM   Final diagnoses:  HTN (hypertension)    43 yo female with difficult to control HTN.  Will add back her third daily dosing of clonidine (was on 0.2 mg tid inpatient, which was changed to .1 mg bid upon d/c home.  Pt again without end organ damage or htn urgency/emergency signs on exam.  Pt is stable for outpatient f/u.    Kalman Drape, MD 09/04/13 [phone]

## 2013-09-04 NOTE — ED Notes (Signed)
Per EMS, patient discharged on 09/03/13 at 1200. Patient was diagnosed with HTN on Monday. Patient states she laid down and had a headache so she checked her BP and found it to be elevated. Patient denies any additional symptoms.

## 2013-09-05 LAB — CATECHOLAMINES, FRACTIONATED, URINE, 24 HOUR
Catecholamines T: 26 mcg/24 h (ref 26–121)
Creatinine, Urine mg/day-CATEUR: 0.96 g/(24.h) (ref 0.63–2.50)
Dopamine 24 Hr Urine: 261 mcg/24 h (ref 52–480)
EPINEPHRINE 24H UR: 2 ug/(24.h) (ref 2–24)
Norepinephrine 24 Hr Urine: 24 mcg/24 h (ref 15–100)
TOTAL URINE VOLUME: 950 mL

## 2013-09-08 ENCOUNTER — Ambulatory Visit (INDEPENDENT_AMBULATORY_CARE_PROVIDER_SITE_OTHER): Payer: Managed Care, Other (non HMO) | Admitting: Family Medicine

## 2013-09-08 VITALS — BP 122/80 | HR 58 | Temp 97.7°F | Resp 18 | Ht 64.0 in | Wt 171.0 lb

## 2013-09-08 DIAGNOSIS — I5189 Other ill-defined heart diseases: Secondary | ICD-10-CM

## 2013-09-08 DIAGNOSIS — R6889 Other general symptoms and signs: Secondary | ICD-10-CM

## 2013-09-08 DIAGNOSIS — Z029 Encounter for administrative examinations, unspecified: Secondary | ICD-10-CM

## 2013-09-08 DIAGNOSIS — I519 Heart disease, unspecified: Secondary | ICD-10-CM

## 2013-09-08 DIAGNOSIS — I10 Essential (primary) hypertension: Secondary | ICD-10-CM

## 2013-09-08 MED ORDER — CARVEDILOL 3.125 MG PO TABS: 3.1250 mg | ORAL_TABLET | Freq: Two times a day (BID) | ORAL | Status: DC

## 2013-09-08 MED ORDER — HYDROCHLOROTHIAZIDE 25 MG PO TABS: 25.0000 mg | ORAL_TABLET | Freq: Every day | ORAL | Status: DC

## 2013-09-08 MED ORDER — AMLODIPINE BESYLATE 10 MG PO TABS: 10.0000 mg | ORAL_TABLET | Freq: Every day | ORAL | Status: DC

## 2013-09-08 MED ORDER — CLONIDINE HCL 0.1 MG PO TABS: 0.1000 mg | ORAL_TABLET | Freq: Three times a day (TID) | ORAL | Status: DC

## 2013-09-08 NOTE — Progress Notes (Signed)
Subjective:    Patient ID: Yolanda Jennings, female    DOB: 12/29/1970, 43 y.o.   MRN: [id number]  HPI Yolanda Jennings is a 43 y.o. female  Here for hospital follow up. Last seen by me 08/28/13 - sent to hospital for suspected ACE inhibitor induced angioedema.   HTN - just diagnosed with HTN 08/24/13. Seep prior ov and d/c summary from recent admission  - CT head negative with her ha's, normal TSH, renal artery duplex normal, echo - grade 2 diastolic dysfunction, nl systolic function. Ct abdomen without acute findings. Started on amlodipine, clonidine and hydralazine, then Coreg. At discharge - concern for hypotension- reduced clonidine and changed hydralazine to hctz to simplify regimen. Most recetn labs below. Urine metanephrines noted as slightly elevated, but not significantly elevated.  Seen in ER 09/04/13 - BP 180/100's at home on lower doses above. Added back 3rd daily dose of clonidine (0.1mg  tid).    Home readings - 145/89, up to 160/92, lowest 135/79.  Rare funny feeling lasts few minutes, no weakness or lightheadedness, no chest pain, unable to describe further. No new side effects.   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    Results for orders placed during the hospital encounter of 08/28/13  MRSA PCR SCREENING      Result Value Ref Range   MRSA by PCR NEGATIVE  NEGATIVE  CBC WITH DIFFERENTIAL      Result Value Ref Range   WBC 9.5  4.0 - 10.5 K/uL   RBC 5.01  3.87 - 5.11 MIL/uL   Hemoglobin 12.6  12.0 - 15.0 g/dL   HCT 37.0  36.0 - 46.0 %   MCV 73.9 (*) 78.0 - 100.0 fL   MCH 25.1 (*) 26.0 - 34.0 pg   MCHC 34.1  30.0 - 36.0 g/dL   RDW 16.6 (*) 11.5 - 15.5 %   Platelets 244  150 - 400 K/uL   Neutrophils Relative % 89 (*) 43 - 77 %   Neutro Abs 8.5 (*) 1.7 - 7.7 K/uL   Lymphocytes Relative 9 (*) 12 - 46 %   Lymphs Abs 0.8  0.7 - 4.0 K/uL   Monocytes Relative 2 (*) 3 - 12 %   Monocytes  Absolute 0.2  0.1 - 1.0 K/uL   Eosinophils Relative 0  0 - 5 %   Eosinophils Absolute 0.0  0.0 - 0.7 K/uL   Basophils Relative 0  0 - 1 %   Basophils Absolute 0.0  0.0 - 0.1 K/uL  COMPREHENSIVE METABOLIC PANEL      Result Value Ref Range   Sodium 139  137 - 147 mEq/L   Potassium 4.1  3.7 - 5.3 mEq/L   Chloride 104  96 - 112 mEq/L   CO2 20  19 - 32 mEq/L   Glucose, Bld 89  70 - 99 mg/dL   BUN 8  6 - 23 mg/dL   Creatinine, Ser 0.95  0.50 - 1.10 mg/dL   Calcium 9.2  8.4 - 10.5 mg/dL   Total Protein 8.1  6.0 - 8.3 g/dL   Albumin 4.0  3.5 - 5.2 g/dL   AST 18  0 - 37 U/L   ALT 7  0 - 35 U/L   Alkaline Phosphatase 101  39 - 117 U/L   Total Bilirubin 0.5  0.3 - 1.2 mg/dL   GFR calc non Af Amer 72 (*) >  90 mL/min   GFR calc Af Amer 84 (*) >90 mL/min  MAGNESIUM      Result Value Ref Range   Magnesium 1.9  1.5 - 2.5 mg/dL  PHOSPHORUS      Result Value Ref Range   Phosphorus 2.1 (*) 2.3 - 4.6 mg/dL  APTT      Result Value Ref Range   aPTT 36  24 - 37 seconds  PROTIME-INR      Result Value Ref Range   Prothrombin Time 12.5  11.6 - 15.2 seconds   INR 0.95  0.00 - 1.49  TSH      Result Value Ref Range   TSH 0.725  0.350 - 4.500 uIU/mL  COMPREHENSIVE METABOLIC PANEL      Result Value Ref Range   Sodium 137  137 - 147 mEq/L   Potassium 3.7  3.7 - 5.3 mEq/L   Chloride 101  96 - 112 mEq/L   CO2 20  19 - 32 mEq/L   Glucose, Bld 134 (*) 70 - 99 mg/dL   BUN 11  6 - 23 mg/dL   Creatinine, Ser 1.10  0.50 - 1.10 mg/dL   Calcium 9.2  8.4 - 10.5 mg/dL   Total Protein 7.7  6.0 - 8.3 g/dL   Albumin 3.6  3.5 - 5.2 g/dL   AST 14  0 - 37 U/L   ALT 7  0 - 35 U/L   Alkaline Phosphatase 96  39 - 117 U/L   Total Bilirubin 0.3  0.3 - 1.2 mg/dL   GFR calc non Af Amer 61 (*) >90 mL/min   GFR calc Af Amer 70 (*) >90 mL/min  CBC      Result Value Ref Range   WBC 7.6  4.0 - 10.5 K/uL   RBC 4.99  3.87 - 5.11 MIL/uL   Hemoglobin 12.2  12.0 - 15.0 g/dL   HCT 36.9  36.0 - 46.0 %   MCV 73.9 (*) 78.0 -  100.0 fL   MCH 24.4 (*) 26.0 - 34.0 pg   MCHC 33.1  30.0 - 36.0 g/dL   RDW 16.8 (*) 11.5 - 15.5 %   Platelets 268  150 - 400 K/uL  GLUCOSE, CAPILLARY      Result Value Ref Range   Glucose-Capillary 132 (*) 70 - 99 mg/dL  GLUCOSE, CAPILLARY      Result Value Ref Range   Glucose-Capillary 122 (*) 70 - 99 mg/dL  GLUCOSE, CAPILLARY      Result Value Ref Range   Glucose-Capillary 108 (*) 70 - 99 mg/dL   Comment 1 Documented in Chart     Comment 2 Notify RN    CATECHOLAMINES, FRACTIONATED, PLASMA      Result Value Ref Range   Norepinephrine 941     Epinephrine 237     Dopamine REPORT     Total Catecholamines (Nor+Epi) 1178    CATECHOLAMINES, FRACTIONATED, URINE, 24 HOUR      Result Value Ref Range   Creatinine, Urine mg/day-CATEUR 0.96  0.63 - 2.50 g/24 h   Catecholamines T 26  26 - 121 mcg/24 h   Epinephrine 24 Hr Urine 2  2 - 24 mcg/24 h   Norepinephrine 24 Hr Urine 24  15 - 100 mcg/24 h   Dopamine 24 Hr Urine 261  52 - 480 mcg/24 h   Total urine volume 950    METANEPHRINES, URINE, 24 HOUR      Result Value Ref Range   Volume,  Urine-METAN 950     Metanephrines, Ur 262 (*) 58 - 203 mcg/24 h   Normetanephrine, 24H Ur 523  88 - 649 mcg/24 h   Metaneph Total, Ur 785 (*) 182 - 739 mcg/24 h  GLUCOSE, CAPILLARY      Result Value Ref Range   Glucose-Capillary 101 (*) 70 - 99 mg/dL  BASIC METABOLIC PANEL      Result Value Ref Range   Sodium 140  137 - 147 mEq/L   Potassium 3.6 (*) 3.7 - 5.3 mEq/L   Chloride 106  96 - 112 mEq/L   CO2 23  19 - 32 mEq/L   Glucose, Bld 104 (*) 70 - 99 mg/dL   BUN 11  6 - 23 mg/dL   Creatinine, Ser 1.14 (*) 0.50 - 1.10 mg/dL   Calcium 9.0  8.4 - 10.5 mg/dL   GFR calc non Af Amer 58 (*) >90 mL/min   GFR calc Af Amer 67 (*) >90 mL/min  CBC      Result Value Ref Range   WBC 5.4  4.0 - 10.5 K/uL   RBC 4.52  3.87 - 5.11 MIL/uL   Hemoglobin 11.3 (*) 12.0 - 15.0 g/dL   HCT 33.2 (*) 36.0 - 46.0 %   MCV 73.5 (*) 78.0 - 100.0 fL   MCH 25.0 (*) 26.0 -  34.0 pg   MCHC 34.0  30.0 - 36.0 g/dL   RDW 16.5 (*) 11.5 - 15.5 %   Platelets 205  150 - 400 K/uL  CBC      Result Value Ref Range   WBC 5.5  4.0 - 10.5 K/uL   RBC 4.27  3.87 - 5.11 MIL/uL   Hemoglobin 10.3 (*) 12.0 - 15.0 g/dL   HCT 31.8 (*) 36.0 - 46.0 %   MCV 74.5 (*) 78.0 - 100.0 fL   MCH 24.1 (*) 26.0 - 34.0 pg   MCHC 32.4  30.0 - 36.0 g/dL   RDW 16.6 (*) 11.5 - 15.5 %   Platelets 249  150 - 400 K/uL  BASIC METABOLIC PANEL      Result Value Ref Range   Sodium 138  137 - 147 mEq/L   Potassium 4.1  3.7 - 5.3 mEq/L   Chloride 105  96 - 112 mEq/L   CO2 23  19 - 32 mEq/L   Glucose, Bld 99  70 - 99 mg/dL   BUN 10  6 - 23 mg/dL   Creatinine, Ser 1.05  0.50 - 1.10 mg/dL   Calcium 8.6  8.4 - 10.5 mg/dL   GFR calc non Af Amer 64 (*) >90 mL/min   GFR calc Af Amer 74 (*) >90 mL/min  GLUCOSE, CAPILLARY      Result Value Ref Range   Glucose-Capillary 103 (*) 70 - 99 mg/dL   Comment 1 Documented in Chart     Comment 2 Notify RN    CBC      Result Value Ref Range   WBC 4.9  4.0 - 10.5 K/uL   RBC 3.84 (*) 3.87 - 5.11 MIL/uL   Hemoglobin 10.0 (*) 12.0 - 15.0 g/dL   HCT 28.4 (*) 36.0 - 46.0 %   MCV 74.0 (*) 78.0 - 100.0 fL   MCH 26.0  26.0 - 34.0 pg   MCHC 35.2  30.0 - 36.0 g/dL   RDW 16.6 (*) 11.5 - 15.5 %   Platelets 246  150 - 400 K/uL  GLUCOSE, CAPILLARY      Result  Value Ref Range   Glucose-Capillary 102 (*) 70 - 99 mg/dL   Comment 1 Notify RN     Comment 2 Documented in Chart    I-STAT CHEM 8, ED      Result Value Ref Range   Sodium 142  137 - 147 mEq/L   Potassium 3.8  3.7 - 5.3 mEq/L   Chloride 107  96 - 112 mEq/L   BUN 7  6 - 23 mg/dL   Creatinine, Ser 1.00  0.50 - 1.10 mg/dL   Glucose, Bld 88  70 - 99 mg/dL   Calcium, Ion 1.15  1.12 - 1.23 mmol/L   TCO2 20  0 - 100 mmol/L   Hemoglobin 14.3  12.0 - 15.0 g/dL   HCT 42.0  36.0 - 46.0 %     Patient Active Problem List   Diagnosis Date Noted  . Microcytic anemia 09/03/2013  . Headache(784.0) 09/03/2013  .  Hypertensive urgency 08/29/2013  . Angioedema 08/28/2013  . HTN (hypertension) 08/28/2013   Past Medical History  Diagnosis Date  . Sickle cell trait   . Hypertension   . Tobacco abuse     Stopped smoking 08/24/13   Past Surgical History  Procedure Laterality Date  . Tubal ligation      2003  . Breast biopsy Left 2011   Allergies  Allergen Reactions  . Ace Inhibitors Swelling    Tongue swelling  . Lisinopril Swelling   Prior to Admission medications   Medication Sig Start Date End Date Taking? Authorizing Provider  amLODipine (NORVASC) 10 MG tablet Take 1 tablet (10 mg total) by mouth daily. 09/03/13  Yes Janece Canterbury, MD  carvedilol (COREG) 3.125 MG tablet Take 1 tablet (3.125 mg total) by mouth 2 (two) times daily with a meal. 09/03/13  Yes Janece Canterbury, MD  cloNIDine (CATAPRES) 0.1 MG tablet Take 1 tablet (0.1 mg total) by mouth 3 (three) times daily. 09/04/13  Yes Kalman Drape, MD  hydrochlorothiazide (HYDRODIURIL) 25 MG tablet Take 1 tablet (25 mg total) by mouth daily. 08/28/13  Yes [name], MD  ibuprofen (ADVIL,MOTRIN) 200 MG tablet Take 400 mg by mouth every 4 (four) hours as needed for moderate pain.    Yes Historical Provider, MD   History   Social History  . Marital Status: Single    Spouse Name: N/A    Number of Children: N/A  . Years of Education: N/A   Occupational History  . Not on file.   Social History Main Topics  . Smoking status: Former Smoker -- 20 years    Quit date: 08/24/2013  . Smokeless tobacco: Never Used  . Alcohol Use: Yes     Comment: occasional  . Drug Use: No  . Sexual Activity: Not on file   Other Topics Concern  . Not on file   Social History Narrative  . No narrative on file       Review of Systems  Constitutional: Negative for fatigue.  Respiratory: Negative for chest tightness and shortness of breath.   Cardiovascular: Negative for chest pain, palpitations and leg swelling.  Gastrointestinal: Negative for  abdominal pain and blood in stool.  Neurological: Negative for dizziness, tremors, seizures, syncope, speech difficulty, weakness, light-headedness and headaches (resolved. ).       Objective:   Physical Exam  Vitals reviewed. Constitutional: She is oriented to person, place, and time. She appears well-developed and well-nourished.  HENT:  Head: Normocephalic and atraumatic.  Eyes: Conjunctivae and EOM are  normal. Pupils are equal, round, and reactive to light.  Neck: Carotid bruit is not present.  Cardiovascular: Normal rate, regular rhythm, normal heart sounds and intact distal pulses.   Pulmonary/Chest: Effort normal and breath sounds normal.  Abdominal: Soft. She exhibits no pulsatile midline mass. There is no tenderness.  Neurological: She is alert and oriented to person, place, and time.  Skin: Skin is warm and dry.  Psychiatric: She has a normal mood and affect. Her behavior is normal.   Filed Vitals:   09/08/13 1123  BP: 122/80  Pulse: 58  Temp: 97.7 F (36.5 C)  TempSrc: Oral  Resp: 18  Height: 5\' 4"  (1.626 m)  Weight: 171 lb (77.565 kg)  SpO2: 96%       Assessment & Plan:   Yolanda Jennings is a 43 y.o. female HTN (hypertension) - Plan: hydrochlorothiazide (HYDRODIURIL) 25 MG tablet, cloNIDine (CATAPRES) 0.1 MG tablet, carvedilol (COREG) 3.125 MG tablet, amLODipine (NORVASC) 10 MG tablet, Ambulatory referral to Cardiology  Diastolic dysfunction - Plan: Ambulatory referral to Cardiology  Abnormal clinical finding - Plan: Ambulatory referral to Cardiology  S/p workup of hypertension in recent hospitalization. Will refer to cardiology for eval of diastolic dysfunction and further discussion of elevated urine metanephrines, but per reference range - these elevations may not be significant. Continue same regimen at present, and anticipate return to full duty next week if bp remains stable this week - she will be calling with update, then intend on ppwk completion at  that time, but letter to rtw tentatively given for this Monday.  Rtc/er precautions.   Meds ordered this encounter  Medications  . hydrochlorothiazide (HYDRODIURIL) 25 MG tablet    Sig: Take 1 tablet (25 mg total) by mouth daily.    Dispense:  30 tablet    Refill:  1  . cloNIDine (CATAPRES) 0.1 MG tablet    Sig: Take 1 tablet (0.1 mg total) by mouth 3 (three) times daily.    Dispense:  90 tablet    Refill:  1  . carvedilol (COREG) 3.125 MG tablet    Sig: Take 1 tablet (3.125 mg total) by mouth 2 (two) times daily with a meal.    Dispense:  60 tablet    Refill:  1  . amLODipine (NORVASC) 10 MG tablet    Sig: Take 1 tablet (10 mg total) by mouth daily.    Dispense:  30 tablet    Refill:  1   Patient Instructions  Call me later this week with update on pressures and symptoms (including if any further "funny feeling" - make sure you check your blood pressure when this happens). Plan on return to wor next week if you are doing well and recheck in 1 month. Return to the clinic or go to the nearest emergency room if any of your symptoms worsen or new symptoms occur. i will also refer you to cardiology in the meantime.

## 2013-09-08 NOTE — Patient Instructions (Signed)
Call me later this week with update on pressures and symptoms (including if any further "funny feeling" - make sure you check your blood pressure when this happens). Plan on return to wor next week if you are doing well and recheck in 1 month. Return to the clinic or go to the nearest emergency room if any of your symptoms worsen or new symptoms occur. i will also refer you to cardiology in the meantime.

## 2013-09-11 ENCOUNTER — Telehealth: Payer: Self-pay

## 2013-09-11 NOTE — Telephone Encounter (Signed)
Patient walked in to 102 to see if her FMLA ppw was ready for pick up. She talked to [facility] because she had some questions. We think it might have been faxed to her job just like she requested on the forms but over the phone she asked Aleasha for a copy. There was nothing in the pick up drawer. Please call patient back when copies are ready for pick up. She said she is going to call her job to check if they received the forms. Thank you!

## 2013-09-14 NOTE — Telephone Encounter (Signed)
Paperwork is on TL desk- Dr. Carlota Raspberry has some questions that need answering to complete the paperwork. Lm on mobile phone.

## 2013-09-14 NOTE — Telephone Encounter (Signed)
Pt FMLA ppw should be returned to the disabilities desk before being given to the patient. Thank you!

## 2013-09-16 NOTE — Progress Notes (Signed)
Per phone call 09/14/13: no new side effects of meds. BP 121/81, 118/83, 118/81 and reports " doing fine". FMLA /rtw ppwk completed.

## 2013-09-16 NOTE — Telephone Encounter (Signed)
FMLA paperwork rtn to Dr. Carlota Raspberry to complete.  Please return to disabilities when finished.

## 2013-09-17 ENCOUNTER — Encounter: Payer: Self-pay | Admitting: Family Medicine

## 2013-09-17 NOTE — Telephone Encounter (Signed)
Pt's ppw has been faxed as requested by the pt.

## 2013-10-17 ENCOUNTER — Telehealth: Payer: Self-pay | Admitting: Family Medicine

## 2013-10-17 NOTE — Telephone Encounter (Signed)
She can try to relax for the day and check to BP this evening prior to when she is due to take them again.   Most likely we will just have her skip the clonidine this evening and pick up with her medications in the morning.   She can hold the clonidine if her BP is running around where it is currently.

## 2013-10-17 NOTE — Telephone Encounter (Signed)
Patient notified and voiced understanding.

## 2013-10-17 NOTE — Telephone Encounter (Signed)
Patient called because she concerned about messing up her BP meds today. She takes 4 medications: amlodipine 10 mg qd, carvedilol 3.125 mg bid, clonidine 0.1 mg tid , and HCTZ 25 mg qd. She took all 4 this am around 7:30 and at 3:30 she accidentally took all 4 again. She was only supposed to take carvedilol and clonidine this afternoon. She does not have any sxs of bp running low. She feels fine. She checked her bp while I was on phone with her and it was 150/100. She is supposed to take clonidine again tonight at 11 pm and then all 4 tomorrow morning around 7. She wants to know if she should skip any doses since she took all 4 again this afternoon. Please advise

## 2013-10-18 ENCOUNTER — Ambulatory Visit (INDEPENDENT_AMBULATORY_CARE_PROVIDER_SITE_OTHER): Payer: Managed Care, Other (non HMO) | Admitting: Family Medicine

## 2013-10-18 VITALS — BP 108/70 | HR 66 | Temp 98.1°F | Resp 16 | Ht 64.0 in | Wt 171.0 lb

## 2013-10-18 DIAGNOSIS — I10 Essential (primary) hypertension: Secondary | ICD-10-CM

## 2013-10-18 DIAGNOSIS — F418 Other specified anxiety disorders: Secondary | ICD-10-CM

## 2013-10-18 DIAGNOSIS — R002 Palpitations: Secondary | ICD-10-CM

## 2013-10-18 DIAGNOSIS — F411 Generalized anxiety disorder: Secondary | ICD-10-CM

## 2013-10-18 MED ORDER — CLONAZEPAM 0.5 MG PO TABS: 0.2500 mg | ORAL_TABLET | Freq: Two times a day (BID) | ORAL | Status: DC | PRN

## 2013-10-18 NOTE — Patient Instructions (Addendum)
Keep a record of your blood pressures outside of the office and bring them to the next office visit. If less than 867 systolic, recommend holding the clonidine for next dose until stabilizes. Return to the clinic or go to the nearest emergency room if any of your symptoms worsen or new symptoms occur.  Klonopin up to twice per day- specifically at night to begin with. To look up more info on your condition, go to the website [email or web address], then on patient resources - select [facility]. Under patient resources, select  Insomnia. Work on [employer] scientist as we discussed.   Recheck in next few weeks.

## 2013-10-18 NOTE — Progress Notes (Addendum)
Subjective:    Patient ID: Yolanda Jennings, female    DOB: [birth year], 43 y.o.   MRN: [id number] This chart was scribed for [name], MD by Vernell Barrier, Medical Scribe. This patient's care was started at 12:41 PM.  HPI HPI Comments: Yolanda Jennings is a 43 y.o. female who presents to the [facility]. Hx of HTN. Last office visit March 3rd. Hospitalized for ace inhibitor induced angio edema. Referred to cardiology. Continued on HCTZ, Coreg, Clonidine, and Norvasc. See phone note from yesterday. Accidentally took extra dose of medication(HCTZ and Amlodipine). Advised to hold Clonidine last night.   Today presents with concerns of a panic attack occuring last night. Feels fine today. States last night she had an anxiety attack. Has never had one before. Had tooth extracted 2 days and took pain hydrocodone for it. Says 30 minutes heart began beating faster and harder. Was not experiencing numbness, tingling, nausea, or vomiting during this episode. She measured her BP at 167/99 at that time. Called the ambulance and had an EKG performed at home. Was told everything looked okay. Says they stayed with her until BP went down. Took all medications this morning. Has been feeling a little more stressed lately due to her son. Has been exercising more by walking for heart health. No hx of depression.   Reports she has not slept more than 3 hours since being discharge from the hospital. Has never had trouble with sleeping; usually sleeps 8 hours. Not drinking excessive caffeine. Just noticed past few weeks. Has been concerned with 41 year old son making life choices. No legal or drug issues. Just worried about financial choices. Denies anhedonia or depression.  Has been experiencing some mild leg swelling at work from standing long periods of time. States it will go down at home once she elevates her feet.   Patient Active Problem List   Diagnosis Date Noted  . Microcytic  anemia 09/03/2013  . Headache(784.0) 09/03/2013  . Hypertensive urgency 08/29/2013  . Angioedema 08/28/2013  . HTN (hypertension) 08/28/2013   Past Medical History  Diagnosis Date  . Sickle cell trait   . Hypertension   . Tobacco abuse     Stopped smoking 08/24/13   Past Surgical History  Procedure Laterality Date  . Tubal ligation      2003  . Breast biopsy Left 2011   Allergies  Allergen Reactions  . Ace Inhibitors Swelling    Tongue swelling  . Lisinopril Swelling   Prior to Admission medications   Medication Sig Start Date End Date Taking? Authorizing Provider  amLODipine (NORVASC) 10 MG tablet Take 1 tablet (10 mg total) by mouth daily. 09/08/13  Yes [name], MD  carvedilol (COREG) 3.125 MG tablet Take 1 tablet (3.125 mg total) by mouth 2 (two) times daily with a meal. 09/08/13  Yes [name], MD  cloNIDine (CATAPRES) 0.1 MG tablet Take 1 tablet (0.1 mg total) by mouth 3 (three) times daily. 09/08/13  Yes [name], MD  hydrochlorothiazide (HYDRODIURIL) 25 MG tablet Take 1 tablet (25 mg total) by mouth daily. 09/08/13  Yes [name], MD  HYDROcodone-acetaminophen (NORCO/VICODIN) 5-325 MG per tablet Take 1 tablet by mouth every 6 (six) hours as needed for moderate pain.   Yes Historical Provider, MD  ibuprofen (ADVIL,MOTRIN) 200 MG tablet Take 400 mg by mouth every 4 (four) hours as needed for moderate pain.    Yes Historical Provider, MD   History  Social History  . Marital Status: Single    Spouse Name: N/A    Number of Children: N/A  . Years of Education: N/A   Occupational History  . Not on file.   Social History Main Topics  . Smoking status: Former Smoker -- 20 years    Quit date: 08/24/2013  . Smokeless tobacco: Never Used  . Alcohol Use: Yes     Comment: occasional  . Drug Use: No  . Sexual Activity: Not on file   Other Topics Concern  . Not on file   Social History Narrative  . No narrative on file   Review of Systems    Cardiovascular: Negative for palpitations.  Psychiatric/Behavioral: The patient is not nervous/anxious.    Objective:  Physical Exam  Vitals reviewed. Constitutional: She is oriented to person, place, and time. She appears well-developed and well-nourished.  HENT:  Head: Normocephalic and atraumatic.  Eyes: Conjunctivae and EOM are normal. Pupils are equal, round, and reactive to light.  Neck: Carotid bruit is not present.  Cardiovascular: Normal rate, regular rhythm, normal heart sounds and intact distal pulses.   No murmur heard. Pulmonary/Chest: Effort normal and breath sounds normal.  Abdominal: Soft. She exhibits no pulsatile midline mass. There is no tenderness.  Musculoskeletal: She exhibits no edema.  Neurological: She is alert and oriented to person, place, and time.  Skin: Skin is warm and dry.  Psychiatric: She has a normal mood and affect. Her behavior is normal. Judgment and thought content normal.   Filed Vitals:   10/18/13 1135  BP: 108/70  Pulse: 66  Temp: 98.1 F (36.7 C)  TempSrc: Oral  Resp: 16  Height: 5\' 4"  (1.626 m)  Weight: 171 lb (77.565 kg)  SpO2: 100%   Assessment & Plan:   Yolanda Jennings is a 43 y.o. female Situational anxiety - Plan: EKG 12-Lead, clonazePAM (KLONOPIN) 0.5 MG tablet - coping techniques discussed.  Denies sx's of depression or GAD - situational based on hx. Declined counseling at present, but this option was discussed. Klonopin BID prn, work on [employer].  Walking for exercise for now and recheck in next 4 weeks.   Palpitations - Plan: EKG 12-Lead - possibly related to hydrocodone vs underlying anxiety and decreased sleep. tx as above, but if sx's recur- rtc/ER precautions.   HTN (hypertension) - controlled now, but cautioned on hypotension with prior extra dose.  monitor multiple times today and if low BP - hold clonidine dose.    Meds ordered this encounter  Medications  . HYDROcodone-acetaminophen (NORCO/VICODIN)  5-325 MG per tablet    Sig: Take 1 tablet by mouth every 6 (six) hours as needed for moderate pain.  . clonazePAM (KLONOPIN) 0.5 MG tablet    Sig: Take 0.5-1 tablets (0.25-0.5 mg total) by mouth 2 (two) times daily as needed for anxiety.    Dispense:  20 tablet    Refill:  0   Patient Instructions  Keep a record of your blood pressures outside of the office and bring them to the next office visit. If less than 267 systolic, recommend holding the clonidine for next dose until stabilizes. Return to the clinic or go to the nearest emergency room if any of your symptoms worsen or new symptoms occur.  Klonopin up to twice per day- specifically at night to begin with. To look up more info on your condition, go to the website [email or web address], then on patient resources - select [facility]. Under patient resources, select  Insomnia. Work  on coping techniques as we discussed.   Recheck in next few weeks.      I personally performed the services described in this documentation, which was scribed in my presence. The recorded information has been reviewed and considered, and addended by me as needed.

## 2013-11-15 ENCOUNTER — Ambulatory Visit (INDEPENDENT_AMBULATORY_CARE_PROVIDER_SITE_OTHER): Payer: Managed Care, Other (non HMO) | Admitting: Family Medicine

## 2013-11-15 VITALS — BP 130/78 | HR 78 | Temp 97.7°F | Resp 16 | Ht 65.0 in | Wt 174.0 lb

## 2013-11-15 DIAGNOSIS — R600 Localized edema: Secondary | ICD-10-CM

## 2013-11-15 DIAGNOSIS — R609 Edema, unspecified: Secondary | ICD-10-CM

## 2013-11-15 DIAGNOSIS — I10 Essential (primary) hypertension: Secondary | ICD-10-CM

## 2013-11-15 MED ORDER — CARVEDILOL 6.25 MG PO TABS: 6.2500 mg | ORAL_TABLET | Freq: Two times a day (BID) | ORAL | Status: DC

## 2013-11-15 MED ORDER — AMLODIPINE BESYLATE 5 MG PO TABS: 5.0000 mg | ORAL_TABLET | Freq: Every day | ORAL | Status: DC

## 2013-11-15 NOTE — Progress Notes (Signed)
Subjective:  This chart was scribed for Yolanda Ray, MD by [name], Scribe.  This patient was seen in [name] and the patient's care was started at 4:04 PM.   Patient ID: Yolanda Jennings, female    DOB: Jul 04, 1971, 43 y.o.   MRN: [id number]  HPI  [name] is a 43 y.o. female PCP: [name], MD   Pt presents with a complaint of bilateral ankle and right lower leg swelling that began one week ago.  She states both ankles are swollen and her right leg is swollen from the ankle up to the knee.  She stands on a forklift at work and reports some occasional ankle swelling at baseline but it is not typically this bad.  She has h/o HTN and regularly medicates with clonidine 0.1mg  3x/day, Norvasc 10mg  1x/day, Coreg 3.125mg  2x/day, and HCTZ 25mg    She denies missed doses and has a journal of her home blood pressures which have typically ranged from 108-128/74-87, with one single outlier of 147/87.  She denies recent long travel or h/o DVT/PE.  She denies CP, lightheadedness, or dizziness.  She had an echocardiogram in February which revealed normal systolic function and grade II diastolic dysfunction.    Patient Active Problem List   Diagnosis Date Noted  . Microcytic anemia 09/03/2013  . Headache(784.0) 09/03/2013  . Hypertensive urgency 08/29/2013  . Angioedema 08/28/2013  . HTN (hypertension) 08/28/2013    Past Medical History  Diagnosis Date  . Sickle cell trait   . Hypertension   . Tobacco abuse     Stopped smoking 08/24/13    Past Surgical History  Procedure Laterality Date  . Tubal ligation      2003  . Breast biopsy Left 2011    Allergies  Allergen Reactions  . Ace Inhibitors Swelling    Tongue swelling  . Lisinopril Swelling    Prior to Admission medications   Medication Sig Start Date End Date Taking? Authorizing Provider  amLODipine (NORVASC) 10 MG tablet Take 1 tablet (10 mg total) by mouth daily. 09/08/13  Yes [name], MD    carvedilol (COREG) 3.125 MG tablet Take 1 tablet (3.125 mg total) by mouth 2 (two) times daily with a meal. 09/08/13  Yes [name], MD  cloNIDine (CATAPRES) 0.1 MG tablet Take 1 tablet (0.1 mg total) by mouth 3 (three) times daily. 09/08/13  Yes [name], MD  hydrochlorothiazide (HYDRODIURIL) 25 MG tablet Take 1 tablet (25 mg total) by mouth daily. 09/08/13  Yes [name], MD  ibuprofen (ADVIL,MOTRIN) 200 MG tablet Take 400 mg by mouth every 4 (four) hours as needed for moderate pain.    Yes Historical Provider, MD  clonazePAM (KLONOPIN) 0.5 MG tablet Take 0.5-1 tablets (0.25-0.5 mg total) by mouth 2 (two) times daily as needed for anxiety. 10/18/13   [name], MD  HYDROcodone-acetaminophen (NORCO/VICODIN) 5-325 MG per tablet Take 1 tablet by mouth every 6 (six) hours as needed for moderate pain.    Historical Provider, MD    History   Social History  . Marital Status: Single    Spouse Name: N/A    Number of Children: N/A  . Years of Education: N/A   Occupational History  . Not on file.   Social History Main Topics  . Smoking status: Former Smoker -- 20 years    Quit date: 08/24/2013  . Smokeless tobacco: Never Used  . Alcohol Use: Yes  Comment: occasional  . Drug Use: No  . Sexual Activity: Not on file   Other Topics Concern  . Not on file   Social History Narrative  . No narrative on file     Review of Systems  Respiratory: Negative for shortness of breath.   Cardiovascular: Positive for leg swelling. Negative for chest pain.  Neurological: Negative for dizziness and light-headedness.         Objective:   Physical Exam  Nursing note and vitals reviewed. Constitutional: She is oriented to person, place, and time. She appears well-developed and well-nourished. No distress.  HENT:  Head: Normocephalic and atraumatic.  Eyes: EOM are normal.  Neck: Neck supple. No tracheal deviation present.  Cardiovascular: Normal rate, regular rhythm  and normal heart sounds.   No murmur heard. Pulmonary/Chest: Effort normal and breath sounds normal. No respiratory distress. She has no wheezes. She has no rales.  Musculoskeletal: Normal range of motion. She exhibits edema.  Slight 1+ pitting edema from bilateral ankles to the pretibial area just below knee.  No calf tenderness, no calf swelling.  Negative Homan's bilaterally.    Neurological: She is alert and oriented to person, place, and time.  Skin: Skin is warm and dry.  Psychiatric: She has a normal mood and affect. Her behavior is normal.     Filed Vitals:   11/15/13 1418  BP: 130/78  Pulse: 78  Temp: 97.7 F (36.5 C)  TempSrc: Oral  Resp: 16  Height: 5\' 5"  (1.651 m)  Weight: 174 lb (78.926 kg)  SpO2: 100%        Assessment & Plan:   Wakisha Alberts is a 43 y.o. female HTN (hypertension) - Plan: amLODipine (NORVASC) 5 MG tablet, carvedilol (COREG) 6.25 MG tablet  Pedal edema - Plan: amLODipine (NORVASC) 5 MG tablet, carvedilol (COREG) 6.25 MG tablet  Suspect edema secondary to norvasc. No calf pain or known rf's for DVT.  decr norvasc to 5mg  qd and incr coreg to 6.25mg  BID. SED. Check home BP's, and orhtostatic/hypotension precautions below discussed. Recheck in 1 month. rtc precautions.   Meds ordered this encounter  Medications  . amLODipine (NORVASC) 5 MG tablet    Sig: Take 1 tablet (5 mg total) by mouth daily.    Dispense:  90 tablet    Refill:  3  . carvedilol (COREG) 6.25 MG tablet    Sig: Take 1 tablet (6.25 mg total) by mouth 2 (two) times daily with a meal.    Dispense:  60 tablet    Refill:  3   Patient Instructions  I suspect the norvasc is causing the swelling. You may be able to tolerate the 5mg  dose. It is not uncommon to have more swelling during the summer as well. We can increase the carvedilol to 6/25mg  twice per day.  Keep a record of your blood pressures outside of the office.  If any lightheadedness or blodd pressures reading below 110  on upper number - hold next dose of clonidine. Return to the clinic or go to the nearest emergency room if any of your symptoms worsen or new symptoms occur. Peripheral Edema You have swelling in your legs (peripheral edema). This swelling is due to excess accumulation of salt and water in your body. Edema may be a sign of heart, kidney or liver disease, or a side effect of a medication. It may also be due to problems in the leg veins. Elevating your legs and using special support stockings may be very  helpful, if the cause of the swelling is due to poor venous circulation. Avoid long periods of standing, whatever the cause. Treatment of edema depends on identifying the cause. Chips, pretzels, pickles and other salty foods should be avoided. Restricting salt in your diet is almost always needed. Water pills (diuretics) are often used to remove the excess salt and water from your body via urine. These medicines prevent the kidney from reabsorbing sodium. This increases urine flow. Diuretic treatment may also result in lowering of potassium levels in your body. Potassium supplements may be needed if you have to use diuretics daily. Daily weights can help you keep track of your progress in clearing your edema. You should call your caregiver for follow up care as recommended. SEEK IMMEDIATE MEDICAL CARE IF:   You have increased swelling, pain, redness, or heat in your legs.  You develop shortness of breath, especially when lying down.  You develop chest or abdominal pain, weakness, or fainting.  You have a fever. Document Released: 08/02/2004 Document Revised: 09/17/2011 Document Reviewed: 07/13/2009 [facility] Patient Information 2014 [place].     I personally performed the services described in this documentation, which was scribed in my presence. The recorded information has been reviewed and considered, and addended by me as needed.

## 2013-11-15 NOTE — Patient Instructions (Signed)
I suspect the norvasc is causing the swelling. You may be able to tolerate the 5mg  dose. It is not uncommon to have more swelling during the summer as well. We can increase the carvedilol to 6/25mg  twice per day.  Keep a record of your blood pressures outside of the office.  If any lightheadedness or blodd pressures reading below 110 on upper number - hold next dose of clonidine. Return to the clinic or go to the nearest emergency room if any of your symptoms worsen or new symptoms occur. Peripheral Edema You have swelling in your legs (peripheral edema). This swelling is due to excess accumulation of salt and water in your body. Edema may be a sign of heart, kidney or liver disease, or a side effect of a medication. It may also be due to problems in the leg veins. Elevating your legs and using special support stockings may be very helpful, if the cause of the swelling is due to poor venous circulation. Avoid long periods of standing, whatever the cause. Treatment of edema depends on identifying the cause. Chips, pretzels, pickles and other salty foods should be avoided. Restricting salt in your diet is almost always needed. Water pills (diuretics) are often used to remove the excess salt and water from your body via urine. These medicines prevent the kidney from reabsorbing sodium. This increases urine flow. Diuretic treatment may also result in lowering of potassium levels in your body. Potassium supplements may be needed if you have to use diuretics daily. Daily weights can help you keep track of your progress in clearing your edema. You should call your caregiver for follow up care as recommended. SEEK IMMEDIATE MEDICAL CARE IF:   You have increased swelling, pain, redness, or heat in your legs.  You develop shortness of breath, especially when lying down.  You develop chest or abdominal pain, weakness, or fainting.  You have a fever. Document Released: 08/02/2004 Document Revised: 09/17/2011  Document Reviewed: 07/13/2009 [facility] Patient Information 2014 [place].

## 2013-11-18 ENCOUNTER — Other Ambulatory Visit: Payer: Self-pay | Admitting: Family Medicine

## 2013-11-26 ENCOUNTER — Other Ambulatory Visit: Payer: Self-pay | Admitting: Family Medicine

## 2013-12-02 ENCOUNTER — Other Ambulatory Visit: Payer: Self-pay | Admitting: Family Medicine

## 2013-12-03 NOTE — Telephone Encounter (Signed)
Sent RF pt notified.

## 2013-12-03 NOTE — Telephone Encounter (Signed)
Patient called about her rx refill. States she has not heard anything and the pharmacy has not received anything from [place]. Please return cal and advise. Thank you.

## 2014-03-04 ENCOUNTER — Ambulatory Visit
Admission: RE | Admit: 2014-03-04 | Discharge: 2014-03-04 | Disposition: A | Discharge status: Home / Self Care | Payer: Managed Care, Other (non HMO) | Source: Ambulatory Visit | Attending: Family Medicine | Admitting: Family Medicine

## 2014-03-04 ENCOUNTER — Ambulatory Visit (INDEPENDENT_AMBULATORY_CARE_PROVIDER_SITE_OTHER): Payer: Managed Care, Other (non HMO) | Admitting: Family Medicine

## 2014-03-04 VITALS — BP 138/76 | HR 63 | Temp 97.9°F | Resp 16 | Ht 65.0 in | Wt 172.2 lb

## 2014-03-04 DIAGNOSIS — R51 Headache: Secondary | ICD-10-CM

## 2014-03-04 DIAGNOSIS — R519 Headache, unspecified: Secondary | ICD-10-CM

## 2014-03-04 DIAGNOSIS — H538 Other visual disturbances: Secondary | ICD-10-CM

## 2014-03-04 DIAGNOSIS — R600 Localized edema: Secondary | ICD-10-CM

## 2014-03-04 DIAGNOSIS — I10 Essential (primary) hypertension: Secondary | ICD-10-CM

## 2014-03-04 DIAGNOSIS — R609 Edema, unspecified: Secondary | ICD-10-CM

## 2014-03-04 LAB — COMPLETE METABOLIC PANEL WITH GFR
ALT: 10 U/L (ref 0–35)
AST: 15 U/L (ref 0–37)
Albumin: 4.3 g/dL (ref 3.5–5.2)
Alkaline Phosphatase: 86 U/L (ref 39–117)
BILIRUBIN TOTAL: 0.5 mg/dL (ref 0.2–1.2)
BUN: 13 mg/dL (ref 6–23)
CO2: 26 mEq/L (ref 19–32)
CREATININE: 1.18 mg/dL — AB (ref 0.50–1.10)
Calcium: 9.6 mg/dL (ref 8.4–10.5)
Chloride: 98 mEq/L (ref 96–112)
GFR, Est African American: 65 mL/min
GFR, Est Non African American: 56 mL/min — ABNORMAL LOW
Glucose, Bld: 93 mg/dL (ref 70–99)
Potassium: 3.4 mEq/L — ABNORMAL LOW (ref 3.5–5.3)
Sodium: 134 mEq/L — ABNORMAL LOW (ref 135–145)
Total Protein: 7.9 g/dL (ref 6.0–8.3)

## 2014-03-04 LAB — GLUCOSE, POCT (MANUAL RESULT ENTRY): POC GLUCOSE: 96 mg/dL (ref 70–99)

## 2014-03-04 LAB — POCT SEDIMENTATION RATE: POCT SED RATE: 22 mm/h (ref 0–22)

## 2014-03-04 NOTE — Progress Notes (Signed)
Subjective:  This chart was scribed for Yolanda Ray, MD by Yolanda Jennings, Medical Scribe. This patient was seen in Room 11 and the patient's care was started at 1:37 PM.   Patient ID: Yolanda Jennings, female    DOB: 1971-03-10, 43 y.o.   MRN: [id number]  HPI HPI Comments: Yolanda Jennings is a 43 y.o. female who presents to the [facility] for a follow-up on her hypertension.  Hypertension - She has a history of hypertension.  On medications as listed.  Had an echocardiogram in February 2015 which revealed normal systolic function but Grade II diastolic dysfunction.  Blood pressures at home have ranged from a high of 167/106 to a low of 98/68 (but rechecked 10 minutes later 151/98) which was yesterday.  Her BP was 128/85 this morning on home blood pressure reading.  She has been experiencing a pressurized headache located at the front of her head which later migrated to the left side of her head for the past two days.  She denies double vision, nausea, vomiting, slurred speech, congestion, rhinorrhea, weakness, dizziness, chest pain, palpitations, increased stress, anxiety, and lightheadedness as associated symptoms.  She has been having intermittent blurred vision in her left eye for the past 2 days.  She last saw an eye doctor in February and was not prescribed any medication for glaucoma.  She has not had a doppler in the past.     For hypertension: Lab Results  Component Value Date   CREATININE 1.05 09/02/2013   CREATININE 1.14* 09/01/2013   CREATININE 1.10 08/29/2013   Patient Active Problem List   Diagnosis Date Noted  . Microcytic anemia 09/03/2013  . Headache(784.0) 09/03/2013  . Hypertensive urgency 08/29/2013  . Angioedema 08/28/2013  . HTN (hypertension) 08/28/2013   Past Medical History  Diagnosis Date  . Sickle cell trait   . Hypertension   . Tobacco abuse     Stopped smoking 08/24/13   Past Surgical History  Procedure Laterality Date  . Tubal  ligation      2003  . Breast biopsy Left 2011   Allergies  Allergen Reactions  . Ace Inhibitors Swelling    Tongue swelling  . Lisinopril Swelling   Prior to Admission medications   Medication Sig Start Date End Date Taking? Authorizing Provider  amLODipine (NORVASC) 5 MG tablet Take 1 tablet (5 mg total) by mouth daily. 11/15/13  Yes [name], MD  carvedilol (COREG) 6.25 MG tablet Take 1 tablet (6.25 mg total) by mouth 2 (two) times daily with a meal. 11/15/13  Yes [name], MD  clonazePAM (KLONOPIN) 0.5 MG tablet Take 0.5-1 tablets (0.25-0.5 mg total) by mouth 2 (two) times daily as needed for anxiety. 10/18/13  Yes [name], MD  cloNIDine (CATAPRES) 0.1 MG tablet TAKE 1 TABLET BY MOUTH THREE TIMES DAILY   Yes [name], MD  hydrochlorothiazide (HYDRODIURIL) 25 MG tablet TAKE 1 TABLET BY MOUTH EVERY DAY   Yes [name], MD  HYDROcodone-acetaminophen (NORCO/VICODIN) 5-325 MG per tablet Take 1 tablet by mouth every 6 (six) hours as needed for moderate pain.   Yes Historical Provider, MD  ibuprofen (ADVIL,MOTRIN) 200 MG tablet Take 400 mg by mouth every 4 (four) hours as needed for moderate pain.    Yes Historical Provider, MD   History   Social History  . Marital Status: Single    Spouse Name: N/A    Number of Children: N/A  . Years  of Education: N/A   Occupational History  . Not on file.   Social History Main Topics  . Smoking status: Former Smoker -- 20 years    Quit date: 08/24/2013  . Smokeless tobacco: Never Used  . Alcohol Use: Yes     Comment: occasional  . Drug Use: No  . Sexual Activity: Not on file   Other Topics Concern  . Not on file   Social History Narrative  . No narrative on file     Review of Systems  HENT: Negative for congestion and rhinorrhea.   Eyes: Positive for visual disturbance.  Cardiovascular: Negative for chest pain and palpitations.  Gastrointestinal: Negative for nausea and vomiting.  Neurological:  Positive for headaches. Negative for dizziness, speech difficulty, weakness and light-headedness.  Psychiatric/Behavioral: The patient is not nervous/anxious.      Objective:  Physical Exam  Vitals reviewed. Constitutional: She is oriented to person, place, and time. She appears well-developed and well-nourished.  HENT:  Head: Normocephalic and atraumatic.  Nose: Right sinus exhibits no maxillary sinus tenderness and no frontal sinus tenderness. Left sinus exhibits no maxillary sinus tenderness and no frontal sinus tenderness.  Unable to reproduce pain with palpation over scalp or temporal area on left.    Eyes: Conjunctivae and EOM are normal. Pupils are equal, round, and reactive to light.  Neck: Carotid bruit is not present.  Cardiovascular: Normal rate, regular rhythm, normal heart sounds and intact distal pulses.  Exam reveals no gallop and no friction rub.   No murmur heard. Pulmonary/Chest: Effort normal and breath sounds normal.  Abdominal: Soft. She exhibits no pulsatile midline mass. There is no tenderness.  Neurological: She is alert and oriented to person, place, and time. She has normal strength. No cranial nerve deficit or sensory deficit. Coordination and gait normal.  No pronator drift.  nonfocal exam.   Skin: Skin is warm and dry.  Psychiatric: She has a normal mood and affect. Her behavior is normal.     Filed Vitals:   03/04/14 1152  BP: 138/76  Pulse: 63  Temp: 97.9 F (36.6 C)  TempSrc: Oral  Resp: 16  Height: _0  (1.651 m)  Weight: 172 lb 3.2 oz (78.109 kg)  SpO2: 100%    Visual Acuity Screening   Right eye Left eye Both eyes  Without correction:     With correction: 20/20-1 20/30-1 20/15-1   Assessment & Plan:   Yolanda Jennings is a 43 y.o. female Essential hypertension - Plan: COMPLETE METABOLIC PANEL WITH GFR, CT Head Wo Contrast  Frontal headache - Plan: POCT SEDIMENTATION RATE, POCT glucose (manual entry), CT Head Wo Contrast, CANCELED:  CT Head W Contrast  Left-sided headache - Plan: POCT SEDIMENTATION RATE, POCT glucose (manual entry), CT Head Wo Contrast, CANCELED: CT Head W Contrast  Blurry vision, left eye - Plan: POCT SEDIMENTATION RATE, CT Head Wo Contrast, CANCELED: CT Head W Contrast  HTN controlled here. Possible error on home monitor with isolated low. Now with HA past 2 days, L frontal, and intermittent L eye blurry vision. Will check ESR, but less likely temporal arteritis. Ct head today, then possible MRI.  Plan to have optho eval tomorrow if possible - pt to call her ophthalmologist today to be seen tomorrow. ER/911 precautions.   No orders of the defined types were placed in this encounter.   Patient Instructions  Please arrive at [facility] [place] @ 3:30 p.m   Continue same blood pressure medicines for  now. Depending on CT scan results, may need other tests for your headache and vision symptoms. Call your eye care provider to be seen tomorrow if possible.   Return to the clinic or go to the nearest emergency room if any of your symptoms worsen or new symptoms occur.     4:26 PM CT head without acute findings. Sed rate WNL.  Will recheck in 2 days, but plan for optho to eval tomorrow. Rtc/er/911 precautions if any worsening.   Results for orders placed in visit on 03/04/14  POCT SEDIMENTATION RATE      Result Value Ref Range   POCT SED RATE 22  0 - 22 mm/hr  GLUCOSE, POCT (MANUAL RESULT ENTRY)      Result Value Ref Range   POC Glucose 96  70 - 99 mg/dl

## 2014-03-04 NOTE — Patient Instructions (Addendum)
Please arrive at [facility] [place] @ 3:30 p.m   Continue same blood pressure medicines for now. Depending on CT scan results, may need other tests for your headache and vision symptoms. Call your eye care provider to be seen tomorrow if possible.  Return to the clinic or go to the nearest emergency room if any of your symptoms worsen or new symptoms occur.  If headache persists into Saturday - return to see me then.   Keep a record of your blood pressures outside of the office and bring them to the next office visit.

## 2014-03-06 ENCOUNTER — Ambulatory Visit (INDEPENDENT_AMBULATORY_CARE_PROVIDER_SITE_OTHER): Payer: Managed Care, Other (non HMO) | Admitting: Family Medicine

## 2014-03-06 VITALS — BP 120/80 | HR 60 | Temp 97.8°F | Resp 16 | Ht 65.0 in | Wt 173.0 lb

## 2014-03-06 DIAGNOSIS — R51 Headache: Secondary | ICD-10-CM

## 2014-03-06 DIAGNOSIS — H538 Other visual disturbances: Secondary | ICD-10-CM

## 2014-03-06 DIAGNOSIS — I10 Essential (primary) hypertension: Secondary | ICD-10-CM

## 2014-03-06 NOTE — Patient Instructions (Signed)
Continue same dose meds, new prescription for glasses.  If headaches not continuing to improve- return to discuss further.  Return to the clinic or go to the nearest emergency room if any of your symptoms worsen or new symptoms occur.

## 2014-03-06 NOTE — Progress Notes (Addendum)
Subjective:  This chart was scribed for Yolanda Ray, MD by Yolanda Jennings, [facility] Scribe. This patient was seen in room 1 and the patient's care was started 4:00 PM.    Patient ID: Yolanda Jennings, female    DOB: 1970/10/29, 43 y.o.   MRN: [id number]  Chief Complaint  Patient presents with  . folllow up    HPI  HPI Comments: Yolanda Jennings is a 43 y.o. female with a PMHx of sickle cell trait and HTN who presents to [facility] here for follow up today. Last seen 2 days ago for HA and L eye blurred vision. She has a history of HTN with reported low at home as well as high readings at home but in normal range at office visit 2 days ago. Slight decreased visual acuity in L eye. SED rate normal at 22 CT head was normal. Pt states she has been doing well. States she followed with eye doctor with eyes looking good. However, was told her prescription needs to be changed which she attributes with her recent HAs. New glasses coming in Friday 9/4 She has tried OTC Tylenol for symptoms with mild improvement of symptoms. Pt reports blood pressure reading this morning 115/75. No CP, SOB, lightheadedness, or dizziness. No recent allergy symptoms. Pt with knonw allergies to ace inhibitors and lisinopril. No other concerns this visit.  Patient Active Problem List   Diagnosis Date Noted  . Microcytic anemia 09/03/2013  . Headache(784.0) 09/03/2013  . Hypertensive urgency 08/29/2013  . Angioedema 08/28/2013  . HTN (hypertension) 08/28/2013   Past Medical History  Diagnosis Date  . Sickle cell trait   . Hypertension   . Tobacco abuse     Stopped smoking 08/24/13   Past Surgical History  Procedure Laterality Date  . Tubal ligation      2003  . Breast biopsy Left 2011   Allergies  Allergen Reactions  . Ace Inhibitors Swelling    Tongue swelling  . Lisinopril Swelling   Prior to Admission medications   Medication Sig Start Date End Date Taking?  Authorizing Provider  amLODipine (NORVASC) 5 MG tablet Take 1 tablet (5 mg total) by mouth daily. 11/15/13  Yes [name], MD  carvedilol (COREG) 6.25 MG tablet Take 1 tablet (6.25 mg total) by mouth 2 (two) times daily with a meal. 11/15/13  Yes [name], MD  clonazePAM (KLONOPIN) 0.5 MG tablet Take 0.5-1 tablets (0.25-0.5 mg total) by mouth 2 (two) times daily as needed for anxiety. 10/18/13  Yes [name], MD  cloNIDine (CATAPRES) 0.1 MG tablet TAKE 1 TABLET BY MOUTH THREE TIMES DAILY   Yes [name], MD  hydrochlorothiazide (HYDRODIURIL) 25 MG tablet TAKE 1 TABLET BY MOUTH EVERY DAY   Yes [name], MD  HYDROcodone-acetaminophen (NORCO/VICODIN) 5-325 MG per tablet Take 1 tablet by mouth every 6 (six) hours as needed for moderate pain.   Yes Historical Provider, MD  ibuprofen (ADVIL,MOTRIN) 200 MG tablet Take 400 mg by mouth every 4 (four) hours as needed for moderate pain.    Yes Historical Provider, MD   History   Social History  . Marital Status: Single    Spouse Name: N/A    Number of Children: N/A  . Years of Education: N/A   Occupational History  . Not on file.   Social History Main Topics  . Smoking status: Former Smoker -- 20 years    Quit  date: 08/24/2013  . Smokeless tobacco: Never Used  . Alcohol Use: Yes     Comment: occasional  . Drug Use: No  . Sexual Activity: Not on file   Other Topics Concern  . Not on file   Social History Narrative  . No narrative on file     Review of Systems  Constitutional: Negative for fatigue and unexpected weight change.  Respiratory: Negative for chest tightness and shortness of breath.   Cardiovascular: Negative for chest pain, palpitations and leg swelling.  Gastrointestinal: Negative for abdominal pain and blood in stool.  Neurological: Positive for headaches (Slight). Negative for dizziness, syncope and light-headedness.     Objective:  Physical Exam  Vitals reviewed. Constitutional: She  is oriented to person, place, and time. She appears well-developed and well-nourished.  HENT:  Head: Normocephalic and atraumatic.  Eyes: Conjunctivae and EOM are normal. Pupils are equal, round, and reactive to light.  Neck: Carotid bruit is not present.  Cardiovascular: Normal rate, regular rhythm, normal heart sounds and intact distal pulses.   Pulmonary/Chest: Effort normal and breath sounds normal.  Abdominal: Soft. She exhibits no pulsatile midline mass. There is no tenderness.  Neurological: She is alert and oriented to person, place, and time.  Skin: Skin is warm and dry.  Psychiatric: She has a normal mood and affect. Her behavior is normal.       Filed Vitals:   03/06/14 1546  BP: 120/80  Pulse: 60  Temp: 97.8 F (36.6 C)  TempSrc: Oral  Resp: 16  Height: 5\' 5"  (1.651 m)  Weight: 173 lb (78.472 kg)  SpO2: 100%    Assessment & Plan:  Yolanda Jennings is a 43 y.o. female Essential hypertension   stable, no new changes. Home BP's also ok.   Blurry vision, left eye  - improved, and eval reportedly ok at optho yesterday, with needing new Rx.   Headache(784.0)  -improved. DDX of tension or sinus HA, or due to need for higher Rx for glasses. rtc if worsens.   No orders of the defined types were placed in this encounter.   Patient Instructions  Continue same dose meds, new prescription for glasses.  If headaches not continuing to improve- return to discuss further.  Return to the clinic or go to the nearest emergency room if any of your symptoms worsen or new symptoms occur.   I personally performed the services described in this documentation, which was scribed in my presence. The recorded information has been reviewed and considered, and addended by me as needed.

## 2014-03-10 ENCOUNTER — Other Ambulatory Visit: Payer: Self-pay | Admitting: Family Medicine

## 2014-03-17 ENCOUNTER — Other Ambulatory Visit: Payer: Self-pay | Admitting: Family Medicine

## 2014-03-17 DIAGNOSIS — E871 Hypo-osmolality and hyponatremia: Secondary | ICD-10-CM

## 2014-03-17 DIAGNOSIS — R7989 Other specified abnormal findings of blood chemistry: Secondary | ICD-10-CM

## 2014-03-17 DIAGNOSIS — E876 Hypokalemia: Secondary | ICD-10-CM

## 2014-05-15 ENCOUNTER — Other Ambulatory Visit: Payer: Self-pay | Admitting: Family Medicine

## 2014-05-15 LAB — HM MAMMOGRAPHY: HM Mammogram: 1

## 2014-05-24 ENCOUNTER — Other Ambulatory Visit: Payer: Self-pay | Admitting: Radiology

## 2014-05-27 ENCOUNTER — Other Ambulatory Visit: Payer: Self-pay | Admitting: Family Medicine

## 2014-06-09 ENCOUNTER — Emergency Department (HOSPITAL_COMMUNITY)
Admission: EM | Admit: 2014-06-09 | Discharge: 2014-06-09 | Disposition: A | Discharge status: Home / Self Care | Payer: Managed Care, Other (non HMO) | Attending: Emergency Medicine | Admitting: Emergency Medicine

## 2014-06-09 ENCOUNTER — Encounter (HOSPITAL_COMMUNITY): Payer: Self-pay | Admitting: Emergency Medicine

## 2014-06-09 DIAGNOSIS — Z79899 Other long term (current) drug therapy: Secondary | ICD-10-CM | POA: Diagnosis not present

## 2014-06-09 DIAGNOSIS — R51 Headache: Secondary | ICD-10-CM | POA: Insufficient documentation

## 2014-06-09 DIAGNOSIS — Z862 Personal history of diseases of the blood and blood-forming organs and certain disorders involving the immune mechanism: Secondary | ICD-10-CM | POA: Insufficient documentation

## 2014-06-09 DIAGNOSIS — I10 Essential (primary) hypertension: Secondary | ICD-10-CM | POA: Diagnosis not present

## 2014-06-09 DIAGNOSIS — Z87891 Personal history of nicotine dependence: Secondary | ICD-10-CM | POA: Diagnosis not present

## 2014-06-09 DIAGNOSIS — R519 Headache, unspecified: Secondary | ICD-10-CM

## 2014-06-09 MED ORDER — KETOROLAC TROMETHAMINE 60 MG/2ML IM SOLN
60.0000 mg | Freq: Once | INTRAMUSCULAR | Status: AC
  Administered 2014-06-09: 60 mg via INTRAMUSCULAR
  Filled 2014-06-09: qty 2

## 2014-06-09 NOTE — Discharge Instructions (Signed)
General Headache Without Cause  A general headache is pain or discomfort felt around the head or neck area. The cause may not be found.   HOME CARE   · Keep all doctor visits.  · Only take medicines as told by your doctor.  · Lie down in a dark, quiet room when you have a headache.  · Keep a journal to find out if certain things bring on headaches. For example, write down:  ¨ What you eat and drink.  ¨ How much sleep you get.  ¨ Any change to your diet or medicines.  · Relax by getting a massage or doing other relaxing activities.  · Put ice or heat packs on the head and neck area as told by your doctor.  · Lessen stress.  · Sit up straight. Do not tighten (tense) your muscles.  · Quit smoking if you smoke.  · Lessen how much alcohol you drink.  · Lessen how much caffeine you drink, or stop drinking caffeine.  · Eat and sleep on a regular schedule.  · Get 7 to 9 hours of sleep, or as told by your doctor.  · Keep lights dim if bright lights bother you or make your headaches worse.  GET HELP RIGHT AWAY IF:   · Your headache becomes really bad.  · You have a fever.  · You have a stiff neck.  · You have trouble seeing.  · Your muscles are weak, or you lose muscle control.  · You lose your balance or have trouble walking.  · You feel like you will pass out (faint), or you pass out.  · You have really bad symptoms that are different than your first symptoms.  · You have problems with the medicines given to you by your doctor.  · Your medicines do not work.  · Your headache feels different than the other headaches.  · You feel sick to your stomach (nauseous) or throw up (vomit).  MAKE SURE YOU:   · Understand these instructions.  · Will watch your condition.  · Will get help right away if you are not doing well or get worse.  Document Released: 04/03/2008 Document Revised: 09/17/2011 Document Reviewed: 06/15/2011  Exit[employer]® Patient Information ©2015 [employer]. This information is not intended to replace advice given to  you by your health care provider. Make sure you discuss any questions you have with your health care provider.

## 2014-06-09 NOTE — ED Provider Notes (Signed)
CSN: [id number]     Arrival date & time 06/09/14  0400 History   First MD Initiated Contact with Patient 06/09/14 [phone]     Chief Complaint  Patient presents with  . Headache  . Hypertension     (Consider location/radiation/quality/duration/timing/severity/associated sxs/prior Treatment) Patient is a 43 y.o. female presenting with headaches and hypertension. The history is provided by the patient.  Headache Associated symptoms: no abdominal pain, no back pain, no diarrhea, no pain, no nausea, no neck stiffness, no numbness and no vomiting   Hypertension Associated symptoms include headaches. Pertinent negatives include no chest pain, no abdominal pain and no shortness of breath.   patient with dull left-sided headache in her left eye. Began a day ago. No vision changes. No confusion. Patient is worried because someone in our office died of a brain aneurysm. No photophobia. It was gradual onset. No syncope. No numbness weakness. She has a history of headaches with her high blood pressure, but states it is usually not to set the headache. Patient states her blood pressure was somewhat elevated today also.  Past Medical History  Diagnosis Date  . Sickle cell trait   . Hypertension   . Tobacco abuse     Stopped smoking 08/24/13   Past Surgical History  Procedure Laterality Date  . Tubal ligation      2003  . Breast biopsy Left 2011   Family History  Problem Relation Age of Onset  . Cancer Mother     lung & cervical  . Hypertension Mother    History  Substance Use Topics  . Smoking status: Former Smoker -- 20 years    Quit date: 08/24/2013  . Smokeless tobacco: Never Used  . Alcohol Use: Yes     Comment: occasional   OB History    Gravida Para Term Preterm AB TAB SAB Ectopic Multiple Living   2 2 2       2      Review of Systems  Constitutional: Negative for activity change and appetite change.  Eyes: Negative for pain.  Respiratory: Negative for chest tightness and  shortness of breath.   Cardiovascular: Negative for chest pain and leg swelling.  Gastrointestinal: Negative for nausea, vomiting, abdominal pain and diarrhea.  Genitourinary: Negative for flank pain.  Musculoskeletal: Negative for back pain and neck stiffness.  Skin: Negative for rash.  Neurological: Positive for headaches. Negative for weakness and numbness.  Psychiatric/Behavioral: Negative for behavioral problems.      Allergies  Ace inhibitors and Lisinopril  Home Medications   Prior to Admission medications   Medication Sig Start Date End Date Taking? Authorizing Provider  amLODipine (NORVASC) 5 MG tablet Take 1 tablet (5 mg total) by mouth daily. 11/15/13  Yes [name], MD  carvedilol (COREG) 6.25 MG tablet TAKE 1 TABLET BY MOUTH TWICE DAILY WITH A MEAL 03/10/14  Yes [name], MD  clonazePAM (KLONOPIN) 0.5 MG tablet Take 0.5-1 tablets (0.25-0.5 mg total) by mouth 2 (two) times daily as needed for anxiety. 10/18/13  Yes [name], MD  cloNIDine (CATAPRES) 0.1 MG tablet TAKE 1 TABLET BY MOUTH 3 TIMES DAILY  "NEEDS A FOLLOW FOR ADDITIONAL REFILLS" 05/16/14  Yes Mancel Bale, PA-C  hydrochlorothiazide (HYDRODIURIL) 25 MG tablet TAKE 1 TABLET BY MOUTH EVERY DAY 05/27/14  Yes [name], MD  HYDROcodone-acetaminophen (NORCO/VICODIN) 5-325 MG per tablet Take 1 tablet by mouth every 6 (six) hours as needed for moderate pain.   Yes Historical Provider,  MD  ibuprofen (ADVIL,MOTRIN) 200 MG tablet Take 600 mg by mouth every 4 (four) hours as needed for moderate pain.    Yes Historical Provider, MD   BP 118/71 mmHg  Pulse 78  Temp(Src) 97.7 F (36.5 C) (Oral)  Resp 14  Ht 5\' 5"  (1.651 m)  Wt 170 lb (77.111 kg)  BMI 28.29 kg/m2  SpO2 100%  LMP 06/01/2014 Physical Exam  Constitutional: She is oriented to person, place, and time. She appears well-developed and well-nourished.  HENT:  Head: Normocephalic and atraumatic.  Eyes: EOM are normal. Pupils are equal,  round, and reactive to light.  Neck: Normal range of motion. Neck supple.  Cardiovascular: Normal rate, regular rhythm and normal heart sounds.   No murmur heard. Pulmonary/Chest: Effort normal and breath sounds normal. No respiratory distress. She has no wheezes. She has no rales.  Abdominal: Soft. Bowel sounds are normal. She exhibits no distension. There is no tenderness. There is no rebound and no guarding.  Musculoskeletal: Normal range of motion.  Neurological: She is alert and oriented to person, place, and time. No cranial nerve deficit.  Skin: Skin is warm and dry.  Psychiatric: She has a normal mood and affect. Her speech is normal.  Nursing note and vitals reviewed.   ED Course  Procedures (including critical care time) Labs Review Labs Reviewed - No data to display  Imaging Review No results found.   EKG Interpretation None      MDM   Final diagnoses:  Nonintractable headache, unspecified chronicity pattern, unspecified headache type    Patient with hypertension and dull headache. Feels much better after treatment. Doubt severe intracranial abnormality. Only mild hypertension here. Has PCP and she will follow-up with him.    Jasper Riling. Alvino Chapel, MD 06/09/14 [phone]

## 2014-06-09 NOTE — ED Notes (Signed)
Pt. reports elevated blood pressure and head ache onset last night .

## 2014-06-09 NOTE — ED Notes (Signed)
Pt states that someone she works with just died from a brain aneurysm and she is worried that she may have one due her hypertension. Pt reports a headache mainly in her forehead and behind her eyes.

## 2014-06-20 ENCOUNTER — Other Ambulatory Visit: Payer: Self-pay | Admitting: Physician Assistant

## 2014-06-21 ENCOUNTER — Ambulatory Visit (INDEPENDENT_AMBULATORY_CARE_PROVIDER_SITE_OTHER): Payer: Managed Care, Other (non HMO) | Admitting: Urgent Care

## 2014-06-21 VITALS — BP 140/88 | HR 75 | Temp 98.2°F | Resp 16 | Ht 64.5 in | Wt 185.0 lb

## 2014-06-21 DIAGNOSIS — I10 Essential (primary) hypertension: Secondary | ICD-10-CM

## 2014-06-21 DIAGNOSIS — H6980 Other specified disorders of Eustachian tube, unspecified ear: Secondary | ICD-10-CM

## 2014-06-21 DIAGNOSIS — J302 Other seasonal allergic rhinitis: Secondary | ICD-10-CM

## 2014-06-21 MED ORDER — TRIAMCINOLONE ACETONIDE 55 MCG/ACT NA AERO: 2.0000 | INHALATION_SPRAY | Freq: Every day | NASAL | Status: DC

## 2014-06-21 MED ORDER — CLONIDINE HCL 0.1 MG PO TABS: 0.1000 mg | ORAL_TABLET | Freq: Two times a day (BID) | ORAL | Status: DC

## 2014-06-21 MED ORDER — CETIRIZINE HCL 10 MG PO TABS: 10.0000 mg | ORAL_TABLET | Freq: Every day | ORAL | Status: DC

## 2014-06-21 NOTE — Progress Notes (Signed)
    MRN: [id number] DOB: 18-Dec-1970  Subjective:   Yolanda Jennings is a 43 y.o. female presenting for HTN medication refill and sinus problems.   HTN - reports that she needs a refill on her clonidine. Last dose was yesterday. Checks BP regularly, averages about 037 systolic. Denies medication adverse effects including light-headedness, dizziness. Also denies chest pain, heart racing, palpitations, shob. Admits lower leg swelling when she stands on her feet all day at work, [employer]. Avoids adding salt to her diet, tries to eat healthy. No exercise, patient admits she "could do better", tries to stay active at work. Denies smoking, occasional alcohol drink.  Sinus problems - reports 4 week history of sinus congestion, ear popping, rhinorrhea. Has seasonal allergies, taking Allegra for this. Denies fevers, ear pain, ear drainage, itchy watery or red eyes, sinus pain, throat pain, difficulty swallowing. Denies any other aggravating or relieving factors, no other questions or concerns.  Current medications include amlodipine, hydrochlorothiazide, carvedilol, clonidine, ibuprofen, allegra.  She is allergic to ace inhibitors and lisinopril.  Yolanda Jennings  has a past medical history of Sickle cell trait; Hypertension; Tobacco abuse; and Allergy. Also  has past surgical history that includes Tubal ligation and Breast biopsy (Left, 2011).  ROS As in subjective.  Objective:   Vitals: BP 140/88 mmHg  Pulse 75  Temp(Src) 98.2 F (36.8 C)  Resp 16  Ht 5' 4.5" (1.638 m)  Wt 185 lb (83.915 kg)  BMI 31.28 kg/m2  SpO2 100%  LMP 06/01/2014  Physical Exam  Constitutional: She is oriented to person, place, and time and well-developed, well-nourished, and in no distress.  HENT:  TM's flat L>R, no effusions or erythema. Nasal turbinates boggy and edematous, rhinorrhea clear. Postnasal drip frothy in oropharynx without oropharyngeal exudates or tonsillar edema.  Eyes: Conjunctivae and EOM are  normal. No scleral icterus.  Neck: Normal range of motion. No thyromegaly present.  Cardiovascular: Normal rate, regular rhythm, normal heart sounds and intact distal pulses.  Exam reveals no gallop and no friction rub.   No murmur heard. Pulmonary/Chest: Effort normal and breath sounds normal. No stridor. No respiratory distress. She has no wheezes. She has no rales. She exhibits no tenderness.  Abdominal: Bowel sounds are normal.  Musculoskeletal: Normal range of motion. She exhibits no edema or tenderness.  Lymphadenopathy:    She has no cervical adenopathy.  Neurological: She is alert and oriented to person, place, and time.  Skin: Skin is warm and dry. No rash noted. She is not diaphoretic. No erythema.  Psychiatric: Mood and affect normal.   Assessment and Plan :   1. Essential hypertension - stable, continue healthy diet and exercise - Basic metabolic panel pending - refill cloNIDine (CATAPRES) 0.1 MG tablet; Take 1 tablet (0.1 mg total) by mouth 2 (two) times daily. Dispense: 90 tablet; Refill: 1 - follow up in 3 months  2. Seasonal allergies 3. Eustachian tube dysfunction, unspecified laterality - Advised patient that ETD is difficulty to treat, will attempt better management of allergies - triamcinolone (NASACORT AQ) 55 MCG/ACT AERO nasal inhaler; Place 2 sprays into the nose daily.  Dispense: 1 Inhaler; Refill: 12 - cetirizine (ZYRTEC) 10 MG tablet; Take 1 tablet (10 mg total) by mouth daily.  Dispense: 30 tablet; Refill: 11 - return to clinic if symptoms worsen, fail to resolve or as needed   Jaynee Eagles, PA-C [facility] [phone] 06/22/2014 10:52 AM

## 2014-06-21 NOTE — Patient Instructions (Signed)
Barotitis Media Barotitis media is inflammation of your middle ear. This occurs when the auditory tube (eustachian tube) leading from the back of your nose (nasopharynx) to your eardrum is blocked. This blockage may result from a cold, environmental allergies, or an upper respiratory infection. Unresolved barotitis media may lead to damage or hearing loss (barotrauma), which may become permanent. HOME CARE INSTRUCTIONS   Use medicines as recommended by your health care provider. Over-the-counter medicines will help unblock the canal and can help during times of air travel.  Do not put anything into your ears to clean or unplug them. Eardrops will not be helpful.  Do not swim, dive, or fly until your health care provider says it is all right to do so. If these activities are necessary, chewing gum with frequent, forceful swallowing may help. It is also helpful to hold your nose and gently blow to pop your ears for equalizing pressure changes. This forces air into the eustachian tube.  Only take over-the-counter or prescription medicines for pain, discomfort, or fever as directed by your health care provider.  A decongestant may be helpful in decongesting the middle ear and make pressure equalization easier. SEEK MEDICAL CARE IF:  You experience a serious form of dizziness in which you feel as if the room is spinning and you feel nauseated (vertigo).  Your symptoms only involve one ear. SEEK IMMEDIATE MEDICAL CARE IF:   You develop a severe headache, dizziness, or severe ear pain.  You have bloody or pus-like drainage from your ears.  You develop a fever.  Your problems do not improve or become worse. MAKE SURE YOU:   Understand these instructions.  Will watch your condition.  Will get help right away if you are not doing well or get worse. Document Released: 06/22/2000 Document Revised: 04/15/2013 Document Reviewed: 01/20/2013 Exit[employer] Patient Information 2015 [employer]. This  information is not intended to replace advice given to you by your health care provider. Make sure you discuss any questions you have with your health care provider.  

## 2014-06-22 ENCOUNTER — Encounter: Payer: Self-pay | Admitting: Urgent Care

## 2014-06-22 LAB — BASIC METABOLIC PANEL
BUN: 21 mg/dL (ref 6–23)
CHLORIDE: 99 meq/L (ref 96–112)
CO2: 27 mEq/L (ref 19–32)
CREATININE: 1.19 mg/dL — AB (ref 0.50–1.10)
Calcium: 10.1 mg/dL (ref 8.4–10.5)
Glucose, Bld: 100 mg/dL — ABNORMAL HIGH (ref 70–99)
POTASSIUM: 3.9 meq/L (ref 3.5–5.3)
Sodium: 136 mEq/L (ref 135–145)

## 2014-06-23 ENCOUNTER — Encounter: Payer: Self-pay | Admitting: Urgent Care

## 2014-07-19 ENCOUNTER — Encounter: Payer: Self-pay | Admitting: Family Medicine

## 2014-08-22 ENCOUNTER — Ambulatory Visit (INDEPENDENT_AMBULATORY_CARE_PROVIDER_SITE_OTHER): Payer: Managed Care, Other (non HMO) | Admitting: Family Medicine

## 2014-08-22 VITALS — BP 132/86 | HR 80 | Temp 98.1°F | Resp 19 | Ht 64.75 in | Wt 185.4 lb

## 2014-08-22 DIAGNOSIS — J302 Other seasonal allergic rhinitis: Secondary | ICD-10-CM

## 2014-08-22 DIAGNOSIS — Z13220 Encounter for screening for lipoid disorders: Secondary | ICD-10-CM

## 2014-08-22 DIAGNOSIS — Z131 Encounter for screening for diabetes mellitus: Secondary | ICD-10-CM

## 2014-08-22 DIAGNOSIS — R600 Localized edema: Secondary | ICD-10-CM

## 2014-08-22 DIAGNOSIS — E669 Obesity, unspecified: Secondary | ICD-10-CM | POA: Insufficient documentation

## 2014-08-22 DIAGNOSIS — K429 Umbilical hernia without obstruction or gangrene: Secondary | ICD-10-CM

## 2014-08-22 DIAGNOSIS — I10 Essential (primary) hypertension: Secondary | ICD-10-CM

## 2014-08-22 LAB — POCT URINALYSIS DIPSTICK
Bilirubin, UA: NEGATIVE
KETONES UA: NEGATIVE
Nitrite, UA: NEGATIVE
SPEC GRAV UA: 1.015
Urobilinogen, UA: 0.2
pH, UA: 7.5

## 2014-08-22 MED ORDER — CLONIDINE HCL 0.1 MG PO TABS: 0.1000 mg | ORAL_TABLET | Freq: Three times a day (TID) | ORAL | Status: DC

## 2014-08-22 MED ORDER — HYDROCHLOROTHIAZIDE 25 MG PO TABS: 25.0000 mg | ORAL_TABLET | Freq: Every day | ORAL | Status: DC

## 2014-08-22 MED ORDER — CARVEDILOL 6.25 MG PO TABS: ORAL_TABLET | ORAL | Status: DC

## 2014-08-22 MED ORDER — CETIRIZINE HCL 10 MG PO TABS: 10.0000 mg | ORAL_TABLET | Freq: Every day | ORAL | Status: DC

## 2014-08-22 MED ORDER — TRIAMCINOLONE ACETONIDE 55 MCG/ACT NA AERO: 2.0000 | INHALATION_SPRAY | Freq: Every day | NASAL | Status: DC

## 2014-08-22 MED ORDER — AMLODIPINE BESYLATE 5 MG PO TABS: 5.0000 mg | ORAL_TABLET | Freq: Every day | ORAL | Status: DC

## 2014-08-22 NOTE — Progress Notes (Signed)
Subjective:    Patient ID: Yolanda Jennings, female    DOB: 07-Dec-1970, 44 y.o.   MRN: [id number]  08/22/2014  Medication Refill and Referral   HPI This 44 y.o. female presents for three month follow-up:  1. HTN: three month follow-up.  No changes to management made at last visit.  Home BP 108/70-160/96.  Patient reports good compliance with medication, good tolerance to medication, and good symptom control.   Denies HA/dizziness/blurred vision/focal weakness.   Denies CP/palp/SOB/leg swelling.   2. Allergic Rhinitis:  Takes Zyrtec and Nasacort PRN.  Does not need currently. Denies nasal congestion, rhinorrhea, sneezing, itchy eyes.  3. Umbilical hernia:  Has become painful due to frequent heavy lifting at work.  No n/v/d.  Ready for referral to general surgery.  4. Health Maintenance: Last pap smear 2015 by gynecology.  Normal menses; on menses currently.  BTL for contraception. Mammogram 2015, 2016 Colonoscopy never TDAP in hospital. Flu vaccine 04/08/2014.    Review of Systems  Constitutional: Negative for fever, chills, diaphoresis and fatigue.  Eyes: Negative for visual disturbance.  Respiratory: Negative for cough and shortness of breath.   Cardiovascular: Negative for chest pain, palpitations and leg swelling.  Gastrointestinal: Negative for nausea, vomiting, abdominal pain, diarrhea and constipation.  Endocrine: Negative for cold intolerance, heat intolerance, polydipsia, polyphagia and polyuria.  Neurological: Negative for dizziness, tremors, seizures, syncope, facial asymmetry, speech difficulty, weakness, light-headedness, numbness and headaches.    Past Medical History  Diagnosis Date  . Sickle cell trait   . Hypertension   . Tobacco abuse     Stopped smoking 08/24/13  . Allergy    Past Surgical History  Procedure Laterality Date  . Tubal ligation      2003  . Breast biopsy Left 2011   Allergies  Allergen Reactions  . Ace Inhibitors Swelling    Tongue  swelling  . Lisinopril Swelling   Current Outpatient Prescriptions  Medication Sig Dispense Refill  . amLODipine (NORVASC) 5 MG tablet Take 1 tablet (5 mg total) by mouth daily. 90 tablet 1  . carvedilol (COREG) 6.25 MG tablet TAKE 1 TABLET BY MOUTH TWICE DAILY WITH A MEAL 180 tablet 1  . cetirizine (ZYRTEC) 10 MG tablet Take 1 tablet (10 mg total) by mouth daily. 90 tablet 1  . cloNIDine (CATAPRES) 0.1 MG tablet Take 1 tablet (0.1 mg total) by mouth 3 (three) times daily. 270 tablet 1  . hydrochlorothiazide (HYDRODIURIL) 25 MG tablet Take 1 tablet (25 mg total) by mouth daily. 90 tablet 1  . ibuprofen (ADVIL,MOTRIN) 200 MG tablet Take 600 mg by mouth every 4 (four) hours as needed for moderate pain.     Marland Kitchen triamcinolone (NASACORT AQ) 55 MCG/ACT AERO nasal inhaler Place 2 sprays into the nose daily. 1 Inhaler 12   No current facility-administered medications for this visit.   History   Social History  . Marital Status: Single    Spouse Name: N/A  . Number of Children: N/A  . Years of Education: N/A   Occupational History  . Not on file.   Social History Main Topics  . Smoking status: Former Smoker -- 20 years    Quit date: 08/24/2013  . Smokeless tobacco: Never Used  . Alcohol Use: Yes     Comment: occasional  . Drug Use: No  . Sexual Activity: Not on file   Other Topics Concern  . Not on file   Social History Narrative   Marital status: separated.  Children: 2 children; no grandchildren.      Lives: with two children.      Employment: Freight forwarder      Tobacco: quit 08/2013.       Alcohol:  Socially.      Exercise: sporadic.   Family History  Problem Relation Age of Onset  . Cancer Mother     lung & cervical  . Hypertension Mother   . Alcohol abuse Father   . Hypertension Brother        Objective:    BP 132/86 mmHg  Pulse 80  Temp(Src) 98.1 F (36.7 C) (Oral)  Resp 19  Ht 5' 4.75" (1.645 m)  Wt 185 lb 6.4 oz (84.097 kg)  BMI 31.08 kg/m2  SpO2  100%  LMP 08/20/2014 (Exact Date) Physical Exam  Constitutional: She is oriented to person, place, and time. She appears well-developed and well-nourished. No distress.  HENT:  Head: Normocephalic and atraumatic.  Right Ear: External ear normal.  Left Ear: External ear normal.  Nose: Nose normal.  Mouth/Throat: Oropharynx is clear and moist.  Eyes: Conjunctivae and EOM are normal. Pupils are equal, round, and reactive to light.  Neck: Normal range of motion. Neck supple. Carotid bruit is not present. No thyromegaly present.  Cardiovascular: Normal rate, regular rhythm, normal heart sounds and intact distal pulses.  Exam reveals no gallop and no friction rub.   No murmur heard. Pulmonary/Chest: Effort normal and breath sounds normal. She has no wheezes. She has no rales.  Abdominal: Soft. Bowel sounds are normal. She exhibits no distension and no mass. There is no tenderness. There is no rebound and no guarding. A hernia is present.  +umbilical hernia mild TTP.  Unable to completely reduce.  Lymphadenopathy:    She has no cervical adenopathy.  Neurological: She is alert and oriented to person, place, and time. No cranial nerve deficit.  Skin: Skin is warm and dry. No rash noted. She is not diaphoretic. No erythema. No pallor.  Psychiatric: She has a normal mood and affect. Her behavior is normal.   Results for orders placed or performed in visit on 08/22/14  POCT urinalysis dipstick  Result Value Ref Range   Color, UA pink    Clarity, UA hazy    Glucose, UA ne    Bilirubin, UA neg    Ketones, UA neg    Spec Grav, UA 1.015    Blood, UA large    pH, UA 7.5    Protein, UA trace    Urobilinogen, UA 0.2    Nitrite, UA neg    Leukocytes, UA small (1+)        Assessment & Plan:   1. Essential hypertension, benign   2. Screening for diabetes mellitus   3. Screening, lipid   4. Umbilical hernia without obstruction and without gangrene   5. Essential hypertension   6. Pedal edema    7. Seasonal allergies   8. Obesity (BMI 30.0-34.9)      1. HTN: controlled; obtain labs; refills provided; follow-up with Dr. Carlota Raspberry in six months. 2.  Screening diabetes: obtain glucose, HgbA1c. 3.  Screening lipid: obtain FLP. 4.  Umbilical hernia: worsening; now painful with heavy lifting; refer to general surgery. Present to ED for persistent severe pain or vomiting associated with worsening pain. 5. Allergic Rhinitis: controlled; refills provided. 6. Obesity: worsening; recommend weight loss and exercise.     Meds ordered this encounter  Medications  . amLODipine (NORVASC) 5 MG tablet  Sig: Take 1 tablet (5 mg total) by mouth daily.    Dispense:  90 tablet    Refill:  1  . carvedilol (COREG) 6.25 MG tablet    Sig: TAKE 1 TABLET BY MOUTH TWICE DAILY WITH A MEAL    Dispense:  180 tablet    Refill:  1  . hydrochlorothiazide (HYDRODIURIL) 25 MG tablet    Sig: Take 1 tablet (25 mg total) by mouth daily.    Dispense:  90 tablet    Refill:  1  . cloNIDine (CATAPRES) 0.1 MG tablet    Sig: Take 1 tablet (0.1 mg total) by mouth 3 (three) times daily.    Dispense:  270 tablet    Refill:  1  . cetirizine (ZYRTEC) 10 MG tablet    Sig: Take 1 tablet (10 mg total) by mouth daily.    Dispense:  90 tablet    Refill:  1  . triamcinolone (NASACORT AQ) 55 MCG/ACT AERO nasal inhaler    Sig: Place 2 sprays into the nose daily.    Dispense:  1 Inhaler    Refill:  12    Return in about 6 months (around 02/20/2015) for recheck high blood pressure.     Elayne Guerin, M.D. [facility] [address] [place], [place]  [phone] phone [phone] fax

## 2014-08-22 NOTE — Patient Instructions (Signed)

## 2014-08-23 LAB — CBC WITH DIFFERENTIAL/PLATELET
Basophils Absolute: 0 10*3/uL (ref 0.0–0.1)
Basophils Relative: 0 % (ref 0–1)
EOS ABS: 0.1 10*3/uL (ref 0.0–0.7)
EOS PCT: 2 % (ref 0–5)
HCT: 37.3 % (ref 36.0–46.0)
Hemoglobin: 12.6 g/dL (ref 12.0–15.0)
LYMPHS ABS: 1.7 10*3/uL (ref 0.7–4.0)
LYMPHS PCT: 28 % (ref 12–46)
MCH: 26.7 pg (ref 26.0–34.0)
MCHC: 33.8 g/dL (ref 30.0–36.0)
MCV: 79 fL (ref 78.0–100.0)
MPV: 10.5 fL (ref 8.6–12.4)
Monocytes Absolute: 0.5 10*3/uL (ref 0.1–1.0)
Monocytes Relative: 8 % (ref 3–12)
NEUTROS ABS: 3.7 10*3/uL (ref 1.7–7.7)
Neutrophils Relative %: 62 % (ref 43–77)
Platelets: 386 10*3/uL (ref 150–400)
RBC: 4.72 MIL/uL (ref 3.87–5.11)
RDW: 14.5 % (ref 11.5–15.5)
WBC: 6 10*3/uL (ref 4.0–10.5)

## 2014-08-23 LAB — COMPREHENSIVE METABOLIC PANEL
ALT: 10 U/L (ref 0–35)
AST: 16 U/L (ref 0–37)
Albumin: 3.9 g/dL (ref 3.5–5.2)
Alkaline Phosphatase: 106 U/L (ref 39–117)
BUN: 15 mg/dL (ref 6–23)
CO2: 26 mEq/L (ref 19–32)
CREATININE: 1.29 mg/dL — AB (ref 0.50–1.10)
Calcium: 9.5 mg/dL (ref 8.4–10.5)
Chloride: 100 mEq/L (ref 96–112)
Glucose, Bld: 101 mg/dL — ABNORMAL HIGH (ref 70–99)
Potassium: 3.6 mEq/L (ref 3.5–5.3)
SODIUM: 135 meq/L (ref 135–145)
TOTAL PROTEIN: 7.7 g/dL (ref 6.0–8.3)
Total Bilirubin: 0.4 mg/dL (ref 0.2–1.2)

## 2014-08-23 LAB — HEMOGLOBIN A1C
HEMOGLOBIN A1C: 5.9 % — AB (ref <5.7)
Mean Plasma Glucose: 123 mg/dL — ABNORMAL HIGH (ref <117)

## 2014-08-23 LAB — LIPID PANEL
CHOLESTEROL: 192 mg/dL (ref 0–200)
HDL: 62 mg/dL (ref >39)
LDL Cholesterol: 93 mg/dL (ref 0–99)
Total CHOL/HDL Ratio: 3.1 Ratio
Triglycerides: 187 mg/dL — ABNORMAL HIGH (ref <150)
VLDL: 37 mg/dL (ref 0–40)

## 2014-08-23 LAB — TSH: TSH: 0.572 u[IU]/mL (ref 0.350–4.500)

## 2014-09-07 ENCOUNTER — Other Ambulatory Visit: Payer: Self-pay | Admitting: Family Medicine

## 2014-10-25 ENCOUNTER — Telehealth: Payer: Self-pay

## 2014-10-25 DIAGNOSIS — I10 Essential (primary) hypertension: Secondary | ICD-10-CM

## 2014-10-25 MED ORDER — CLONIDINE HCL 0.1 MG PO TABS: 0.1000 mg | ORAL_TABLET | Freq: Three times a day (TID) | ORAL | Status: DC

## 2014-10-25 NOTE — Telephone Encounter (Signed)
Done . Pt notified.  

## 2014-10-25 NOTE — Telephone Encounter (Signed)
cloNIDine (CATAPRES) 0.1 MG tablet   [facility] [address] [phone]

## 2014-11-09 ENCOUNTER — Other Ambulatory Visit: Payer: Self-pay | Admitting: Family Medicine

## 2015-01-22 ENCOUNTER — Other Ambulatory Visit: Payer: Self-pay | Admitting: Family Medicine

## 2015-02-28 ENCOUNTER — Ambulatory Visit (INDEPENDENT_AMBULATORY_CARE_PROVIDER_SITE_OTHER): Payer: Managed Care, Other (non HMO) | Admitting: Family Medicine

## 2015-02-28 ENCOUNTER — Ambulatory Visit (INDEPENDENT_AMBULATORY_CARE_PROVIDER_SITE_OTHER): Payer: Managed Care, Other (non HMO)

## 2015-02-28 ENCOUNTER — Encounter: Payer: Self-pay | Admitting: Family Medicine

## 2015-02-28 VITALS — BP 157/95 | HR 91 | Temp 98.7°F | Resp 16 | Ht 65.0 in | Wt 183.6 lb

## 2015-02-28 DIAGNOSIS — I10 Essential (primary) hypertension: Secondary | ICD-10-CM

## 2015-02-28 DIAGNOSIS — M25461 Effusion, right knee: Secondary | ICD-10-CM

## 2015-02-28 NOTE — Progress Notes (Addendum)
Subjective:  This chart was scribed for Yolanda Ray, MD by [name], ED Scribe. This patient was seen in room 27 and the patient's care was started at 4:32 PM.   Patient ID: Yolanda Jennings, female    DOB: 1970/11/01, 44 y.o.   MRN: [id number]  HPI  Chief Complaint  Patient presents with  . Hypertension  . Hyperlipidemia  . Knee Pain    right knee feels like she has fluid on her knee   HPI Comments: Yolanda Jennings is a 44 y.o. female who presents to the [facility] for follow up.  Hypertension    Lab Results  Component Value Date   CREATININE 1.29* 08/22/2014   This was slightly elevated from prev visits in the past. 1.05,1.18,1.19 Last seen by Dr. Tamala Julian 08/2014. BP controlled then. Continued on same medications.  Pt states she has not checked BP outside of office since her readings have been doing well. She denies CP, light headedness, dizziness.  Hyperlipidemia   Lab Results  Component Value Date   CHOL 192 08/22/2014   HDL 62 08/22/2014   LDLCALC 93 08/22/2014   TRIG 187* 08/22/2014   CHOLHDL 3.1 08/22/2014   Stable. No meds.   Swelling in knees Pt reports swelling in both knees, right more than left. 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 She reports twisting right knee and hearing a click/pop about 1 month ago. She denies pain in knee but does reports tightness in right knee. She bought an OTC knee brace for right knee . She denies right knee locking or giving out on her. She denies taking OTC medication to treat knee.  Pt is fasting but had a small snack at 5 hours ago.   Patient Active Problem List   Diagnosis Date Noted  . Obesity (BMI 30.0-34.9) 08/22/2014  . Microcytic anemia 09/03/2013  . Headache 09/03/2013  . Hypertensive urgency 08/29/2013  . Angioedema 08/28/2013  . HTN (hypertension) 08/28/2013   Past Medical History  Diagnosis Date    . Sickle cell trait   . Hypertension   . Tobacco abuse     Stopped smoking 08/24/13  . Allergy    Past Surgical History  Procedure Laterality Date  . Tubal ligation      2003  . Breast biopsy Left 2011   Allergies  Allergen Reactions  . Ace Inhibitors Swelling    Tongue swelling  . Lisinopril Swelling   Prior to Admission medications   Medication Sig Start Date End Date Taking? Authorizing Provider  amLODipine (NORVASC) 5 MG tablet Take 1 tablet (5 mg total) by mouth daily. 08/22/14  Yes Wardell Honour, MD  carvedilol (COREG) 6.25 MG tablet TAKE 1 TABLET BY MOUTH TWICE DAILY WITH A MEAL 08/22/14  Yes Wardell Honour, MD  cloNIDine (CATAPRES) 0.1 MG tablet TAKE 1 TABLET BY MOUTH THREE TIMES DAILY 01/24/15  Yes Wardell Honour, MD  hydrochlorothiazide (HYDRODIURIL) 25 MG tablet Take 1 tablet (25 mg total) by mouth daily. 08/22/14  Yes Wardell Honour, MD  ibuprofen (ADVIL,MOTRIN) 200 MG tablet Take 600 mg by mouth every 4 (four) hours as needed for moderate pain.    Yes Historical Provider, MD   Social History   Social History  . Marital Status: Single    Spouse Name: N/A  . Number of Children: N/A  . Years of Education: N/A  Occupational History  . Not on file.   Social History Main Topics  . Smoking status: Former Smoker -- 20 years    Quit date: 08/24/2013  . Smokeless tobacco: Never Used  . Alcohol Use: Yes     Comment: occasional  . Drug Use: No  . Sexual Activity: Not on file   Other Topics Concern  . Not on file   Social History Narrative   Marital status: separated.      Children: 2 children; no grandchildren.      Lives: with two children.      Employment: Freight forwarder      Tobacco: quit 08/2013.       Alcohol:  Socially.      Exercise: sporadic.   Review of Systems  Constitutional: Negative for fatigue and unexpected weight change.  Respiratory: Negative for chest tightness and shortness of breath.   Cardiovascular: Negative for chest pain,  palpitations and leg swelling.  Gastrointestinal: Negative for abdominal pain and blood in stool.  Neurological: Negative for dizziness, syncope, light-headedness and headaches.   Objective:   Physical Exam  Constitutional: She is oriented to person, place, and time. She appears well-developed and well-nourished.  HENT:  Head: Normocephalic and atraumatic.  Eyes: Conjunctivae and EOM are normal. Pupils are equal, round, and reactive to light.  Neck: Carotid bruit is not present.  Cardiovascular: Normal rate, regular rhythm, normal heart sounds and intact distal pulses.   Pulmonary/Chest: Effort normal and breath sounds normal.  Abdominal: Soft. She exhibits no pulsatile midline mass. There is no tenderness.  Musculoskeletal:  Trace non pitting on right and left lower extremity. Calf non tender. 1+ effusion on right knee. Full ROM on right knee. Negative varus and valgus testing. Negative McMurray's negative Lachman's. Positive crepitus.   Neurological: She is alert and oriented to person, place, and time.  Skin: Skin is warm and dry.  Psychiatric: She has a normal mood and affect. Her behavior is normal.  Vitals reviewed.  Filed Vitals:   02/28/15 1607  BP: 157/95  Pulse: 91  Temp: 98.7 F (37.1 C)  TempSrc: Oral  Resp: 16  Height: 5\' 5"  (1.651 m)  Weight: 183 lb 9.6 oz (83.28 kg)   [facility] reading (PRIMARY) by Dr. Carlota Raspberry: min degenerative change. No acute findings.   Assessment & Plan:  [name] is a 44 y.o. female Knee swelling, right - Plan: DG Knee Complete 4 Views Right  -Nontender, no instability or locking. Possible flare of DJD, versus degenerative meniscus tear. Discussed initial trial of bracing at work, elevation, and home exercises as below. However if she has persistent swelling, or any increased pain or instability, would consider MRI or referral to orthopedics.  Essential hypertension - Plan: Comprehensive metabolic panel, Lipid panel  Borderline in  office, but controlled outside. Controlled prior. Would continue same doses of medications for now, but call if blood pressures remain over 140/90 home so we can discuss change in medication regimen.  -Lipid panel, CMP pending  No orders of the defined types were placed in this encounter.   Patient Instructions  Keep a record of your blood pressures outside of the office and bring them to the next office visit. If your readings remain over 140/90 - call and we can discuss med changes.  You should receive a call or letter about your lab results within the next week to 10 days. If blood sugar or cholesterol elevated - can repeat testing after 8 hours fasting.  You can try tylenol as needed for the knee pain, elevate when seated, use the knee brace as needed.You may have a "degenerative meniscus tear", so can initially try some of the exercises below.   If swelling persists, increased pain, or any locking/giving way/instability symptoms, let me know so I can refer you to ortho.  Return to the clinic or go to the nearest emergency room if any of your symptoms worsen or new symptoms occur.   Meniscus Tear with Phase I Rehab The meniscus is a C-shaped cartilage structure, located in the knee joint between the thigh bone (femur) and the shinbone (tibia). Two menisci are located in each knee joint: the inner and outer meniscus. The meniscus acts as an adapter between the thigh bone and shinbone, allowing them to fit properly together. It also functions as a shock absorber, to reduce the stress placed on the knee joint and to help supply nutrients to the knee joint cartilage. As people age, the meniscus begins to harden and become more vulnerable to injury. Meniscus tears are a common injury, especially in older athletes. Inner meniscus tears are more common than outer meniscus tears.  SYMPTOMS   Pain in the knee, especially with standing or squatting with the affected leg.  Tenderness along the joint  line.  Swelling in the knee joint (effusion), usually starting 1 to 2 days after injury.  Locking or catching of the knee joint, causing inability to straighten the knee completely.  Giving way or buckling of the knee. CAUSES  A meniscus tear occurs when a force is placed on the meniscus that is greater than it can handle. Common causes of injury include:  Direct hit (trauma) to the knee.  Twisting, pivoting, or cutting (rapidly changing direction while running), kneeling or squatting.  Without injury, due to aging. RISK INCREASES WITH:  Contact sports (football, rugby).  Sports in which cleats are used with pivoting (soccer, lacrosse) or sports in which good shoe grip and sudden change in direction are required (racquetball, basketball, squash).  Previous knee injury.  Associated knee injury, particularly ligament injuries.  Poor strength and flexibility. PREVENTION  Warm up and stretch properly before activity.  Maintain physical fitness:  Strength, flexibility, and endurance.  Cardiovascular fitness.  Protect the knee with a brace or elastic bandage.  Wear properly fitted protective equipment (proper cleats for the surface). PROGNOSIS  Sometimes, meniscus tears heal on their own. However, definitive treatment requires surgery, followed by at least 6 weeks of recovery.  RELATED COMPLICATIONS   Recurring symptoms that result in a chronic problem.  Repeated knee injury, especially if sports are resumed too soon after injury or surgery.  Progression of the tear (the tear gets larger), if untreated.  Arthritis of the knee in later years (with or without surgery).  Complications of surgery, including infection, bleeding, injury to nerves (numbness, weakness, paralysis) continued pain, giving way, locking, nonhealing of meniscus (if repaired), need for further surgery, and knee stiffness (loss of motion). TREATMENT  Treatment first involves the use of ice and  medicine, to reduce pain and inflammation. You may find using crutches to walk more comfortable. However, it is okay to bear weight on the injured knee, if the pain will allow it. Surgery is often advised as a definitive treatment. Surgery is performed through an incision near the joint (arthroscopically). The torn piece of the meniscus is removed, and if possible the joint cartilage is repaired. After surgery, the joint must be restrained. After restraint, it is  important to perform strengthening and stretching exercises to help regain strength and a full range of motion. These exercises may be completed at home or with a therapist.  MEDICATION  If pain medicine is needed, nonsteroidal anti-inflammatory medicines (aspirin and ibuprofen), or other minor pain relievers (acetaminophen), are often advised.  Do not take pain medicine for 7 days before surgery.  Prescription pain relievers may be given, if your caregiver thinks they are needed. Use only as directed and only as much as you need. HEAT AND COLD  Cold treatment (icing) should be applied for 10 to 15 minutes every 2 to 3 hours for inflammation and pain, and immediately after activity that aggravates your symptoms. Use ice packs or an ice massage.  Heat treatment may be used before performing stretching and strengthening activities prescribed by your caregiver, physical therapist, or athletic trainer. Use a heat pack or a warm water soak. SEEK MEDICAL CARE IF:   Symptoms get worse or do not improve in 2 weeks, despite treatment.  New, unexplained symptoms develop. (Drugs used in treatment may produce side effects.) EXERCISES RANGE OF MOTION (ROM) AND STRETCHING EXERCISES - Meniscus Tear, Non-operative, Phase I These are some of the initial exercises with which you may start your rehabilitation program, until you see your caregiver again or until your symptoms are resolved. Remember:   These initial exercises are intended to be gentle.  They will help you restore motion without increasing any swelling.  Completing these exercises allows less painful movement and prepares you for the more aggressive strengthening exercises in Phase II.  An effective stretch should be held for at least 30 seconds.  A stretch should never be painful. You should only feel a gentle lengthening or release in the stretched tissue. RANGE OF MOTION - Knee Flexion, Active  Lie on your back with both knees straight. (If this causes back discomfort, bend your healthy knee, placing your foot flat on the floor.)  Slowly slide your heel back toward your buttocks until you feel a gentle stretch in the front of your knee or thigh.  Hold for __________ seconds. Slowly slide your heel back to the starting position. Repeat __________ times. Complete this exercise __________ times per day.  RANGE OF MOTION - Knee Flexion and Extension, Active-Assisted  Sit on the edge of a table or chair with your thighs firmly supported. It may be helpful to place a folded towel under the end of your right / left thigh.  Flexion (bending): Place the ankle of your healthy leg on top of the other ankle. Use your healthy leg to gently bend your right / left knee until you feel a mild tension across the top of your knee.  Hold for __________ seconds.  Extension (straightening): Switch your ankles so your right / left leg is on top. Use your healthy leg to straighten your right / left knee until you feel a mild tension on the backside of your knee.  Hold for __________ seconds. Repeat __________ times. Complete __________ times per day. STRETCH - Knee Flexion, Supine  Lie on the floor with your right / left heel and foot lightly touching the wall. (Place both feet on the wall if you do not use a door frame.)  Without using any effort, allow gravity to slide your foot down the wall slowly until you feel a gentle stretch in the front of your right / left knee.  Hold this  stretch for __________ seconds. Then return the leg to the  starting position, using your healthy leg for help, if needed. Repeat __________ times. Complete this stretch __________ times per day.  STRETCH - Knee Extension Sitting  Sit with your right / left leg/heel propped on another chair, coffee table, or foot stool.  Allow your leg muscles to relax, letting gravity straighten out your knee.*  You should feel a stretch behind your right / left knee. Hold this position for __________ seconds. Repeat __________ times. Complete this stretch __________ times per day.  *Your physician, physical therapist or athletic trainer may instruct you place a __________ weight on your thigh, just above your kneecap, to deepen the stretch.  STRENGTHENING EXERCISES - Meniscus Tear, Non-operative, Phase I These exercises may help you when beginning to rehabilitate your injury. They may resolve your symptoms with or without further involvement from your physician, physical therapist or athletic trainer. While completing these exercises, remember:   Muscles can gain both the endurance and the strength needed for everyday activities through controlled exercises.  Complete these exercises as instructed by your physician, physical therapist or athletic trainer. Progress the resistance and repetitions only as guided. STRENGTH - Quadriceps, Isometrics  Lie on your back with your right / left leg extended and your opposite knee bent.  Gradually tense the muscles in the front of your right / left thigh. You should see either your knee cap slide up toward your hip or increased dimpling just above the knee. This motion will push the back of the knee down toward the floor, mat, or bed on which you are lying.  Hold the muscle as tight as you can, without increasing your pain, for __________ seconds.  Relax the muscles slowly and completely between each repetition. Repeat __________ times. Complete this exercise  __________ times per day.  STRENGTH - Quadriceps, Short Arcs   Lie on your back. Place a __________ inch towel roll under your right / left knee, so that the knee bends slightly.  Raise only your lower leg by tightening the muscles in the front of your thigh. Do not allow your thigh to rise.  Hold this position for __________ seconds. Repeat __________ times. Complete this exercise __________ times per day.  OPTIONAL ANKLE WEIGHTS: Begin with ____________________, but DO NOT exceed ____________________. Increase in 1 pound/0.5 kilogram increments. STRENGTH - Quadriceps, Straight Leg Raises  Quality counts! Watch for signs that the quadriceps muscle is working, to be sure you are strengthening the correct muscles and not "cheating" by substituting with healthier muscles.  Lay on your back with your right / left leg extended and your opposite knee bent.  Tense the muscles in the front of your right / left thigh. You should see either your knee cap slide up or increased dimpling just above the knee. Your thigh may even shake a bit.  Tighten these muscles even more and raise your leg 4 to 6 inches off the floor. Hold for __________ seconds.  Keeping these muscles tense, lower your leg.  Relax the muscles slowly and completely in between each repetition. Repeat __________ times. Complete this exercise __________ times per day.  STRENGTH - Hamstring, Curls   Lay on your stomach with your legs extended. (If you lay on a bed, your feet may hang over the edge.)  Tighten the muscles in the back of your thigh to bend your right / left knee up to 90 degrees. Keep your hips flat on the bed.  Hold this position for __________ seconds.  Slowly lower your leg back  to the starting position. Repeat __________ times. Complete this exercise __________ times per day.  STRENGTH - Quadriceps, Squats  Stand in a door frame so that your feet and knees are in line with the frame.  Use your hands for  balance, not support, on the frame.  Slowly lower your weight, bending at the hips and knees. Keep your lower legs upright so that they are parallel with the door frame. Squat only within the range that does not increase your knee pain. Never let your hips drop below your knees.  Slowly return upright, pushing with your legs, not pulling with your hands. Repeat __________ times. Complete this exercise __________ times per day.  STRENGTH - Quad/VMO, Isometric   Sit in a chair with your right / left knee slightly bent. With your fingertips, feel the VMO muscle just above the inside of your knee. The VMO is important in controlling the position of your kneecap.  Keeping your fingertips on this muscle. Without actually moving your leg, attempt to drive your knee down as if straightening your leg. You should feel your VMO tense. If you have a difficult time, you may wish to try the same exercise on your healthy knee first.  Tense this muscle as hard as you can without increasing any knee pain.  Hold for __________ seconds. Relax the muscles slowly and completely in between each repetition. Repeat __________ times. Complete exercise __________ times per day.  Document Released: 07/09/1998 Document Revised: 09/17/2011 Document Reviewed: 10/07/2008 [facility] Patient Information 2015 [place], [place]. This information is not intended to replace advice given to you by your health care provider. Make sure you discuss any questions you have with your health care provider.        I personally performed the services described in this documentation, which was scribed in my presence. The recorded information has been reviewed and considered, and addended by me as needed.

## 2015-02-28 NOTE — Patient Instructions (Addendum)
Keep a record of your blood pressures outside of the office and bring them to the next office visit. If your readings remain over 140/90 - call and we can discuss med changes.  You should receive a call or letter about your lab results within the next week to 10 days. If blood sugar or cholesterol elevated - can repeat testing after 8 hours fasting.   You can try tylenol as needed for the knee pain, elevate when seated, use the knee brace as needed.You may have a "degenerative meniscus tear", so can initially try some of the exercises below.   If swelling persists, increased pain, or any locking/giving way/instability symptoms, let me know so I can refer you to ortho.  Return to the clinic or go to the nearest emergency room if any of your symptoms worsen or new symptoms occur.   Meniscus Tear with Phase I Rehab The meniscus is a C-shaped cartilage structure, located in the knee joint between the thigh bone (femur) and the shinbone (tibia). Two menisci are located in each knee joint: the inner and outer meniscus. The meniscus acts as an adapter between the thigh bone and shinbone, allowing them to fit properly together. It also functions as a shock absorber, to reduce the stress placed on the knee joint and to help supply nutrients to the knee joint cartilage. As people age, the meniscus begins to harden and become more vulnerable to injury. Meniscus tears are a common injury, especially in older athletes. Inner meniscus tears are more common than outer meniscus tears.  SYMPTOMS   Pain in the knee, especially with standing or squatting with the affected leg.  Tenderness along the joint line.  Swelling in the knee joint (effusion), usually starting 1 to 2 days after injury.  Locking or catching of the knee joint, causing inability to straighten the knee completely.  Giving way or buckling of the knee. CAUSES  A meniscus tear occurs when a force is placed on the meniscus that is greater than it  can handle. Common causes of injury include:  Direct hit (trauma) to the knee.  Twisting, pivoting, or cutting (rapidly changing direction while running), kneeling or squatting.  Without injury, due to aging. RISK INCREASES WITH:  Contact sports (football, rugby).  Sports in which cleats are used with pivoting (soccer, lacrosse) or sports in which good shoe grip and sudden change in direction are required (racquetball, basketball, squash).  Previous knee injury.  Associated knee injury, particularly ligament injuries.  Poor strength and flexibility. PREVENTION  Warm up and stretch properly before activity.  Maintain physical fitness:  Strength, flexibility, and endurance.  Cardiovascular fitness.  Protect the knee with a brace or elastic bandage.  Wear properly fitted protective equipment (proper cleats for the surface). PROGNOSIS  Sometimes, meniscus tears heal on their own. However, definitive treatment requires surgery, followed by at least 6 weeks of recovery.  RELATED COMPLICATIONS   Recurring symptoms that result in a chronic problem.  Repeated knee injury, especially if sports are resumed too soon after injury or surgery.  Progression of the tear (the tear gets larger), if untreated.  Arthritis of the knee in later years (with or without surgery).  Complications of surgery, including infection, bleeding, injury to nerves (numbness, weakness, paralysis) continued pain, giving way, locking, nonhealing of meniscus (if repaired), need for further surgery, and knee stiffness (loss of motion). TREATMENT  Treatment first involves the use of ice and medicine, to reduce pain and inflammation. You may find using  crutches to walk more comfortable. However, it is okay to bear weight on the injured knee, if the pain will allow it. Surgery is often advised as a definitive treatment. Surgery is performed through an incision near the joint (arthroscopically). The torn piece of the  meniscus is removed, and if possible the joint cartilage is repaired. After surgery, the joint must be restrained. After restraint, it is important to perform strengthening and stretching exercises to help regain strength and a full range of motion. These exercises may be completed at home or with a therapist.  MEDICATION  If pain medicine is needed, nonsteroidal anti-inflammatory medicines (aspirin and ibuprofen), or other minor pain relievers (acetaminophen), are often advised.  Do not take pain medicine for 7 days before surgery.  Prescription pain relievers may be given, if your caregiver thinks they are needed. Use only as directed and only as much as you need. HEAT AND COLD  Cold treatment (icing) should be applied for 10 to 15 minutes every 2 to 3 hours for inflammation and pain, and immediately after activity that aggravates your symptoms. Use ice packs or an ice massage.  Heat treatment may be used before performing stretching and strengthening activities prescribed by your caregiver, physical therapist, or athletic trainer. Use a heat pack or a warm water soak. SEEK MEDICAL CARE IF:   Symptoms get worse or do not improve in 2 weeks, despite treatment.  New, unexplained symptoms develop. (Drugs used in treatment may produce side effects.) EXERCISES RANGE OF MOTION (ROM) AND STRETCHING EXERCISES - Meniscus Tear, Non-operative, Phase I These are some of the initial exercises with which you may start your rehabilitation program, until you see your caregiver again or until your symptoms are resolved. Remember:   These initial exercises are intended to be gentle. They will help you restore motion without increasing any swelling.  Completing these exercises allows less painful movement and prepares you for the more aggressive strengthening exercises in Phase II.  An effective stretch should be held for at least 30 seconds.  A stretch should never be painful. You should only feel a  gentle lengthening or release in the stretched tissue. RANGE OF MOTION - Knee Flexion, Active  Lie on your back with both knees straight. (If this causes back discomfort, bend your healthy knee, placing your foot flat on the floor.)  Slowly slide your heel back toward your buttocks until you feel a gentle stretch in the front of your knee or thigh.  Hold for __________ seconds. Slowly slide your heel back to the starting position. Repeat __________ times. Complete this exercise __________ times per day.  RANGE OF MOTION - Knee Flexion and Extension, Active-Assisted  Sit on the edge of a table or chair with your thighs firmly supported. It may be helpful to place a folded towel under the end of your right / left thigh.  Flexion (bending): Place the ankle of your healthy leg on top of the other ankle. Use your healthy leg to gently bend your right / left knee until you feel a mild tension across the top of your knee.  Hold for __________ seconds.  Extension (straightening): Switch your ankles so your right / left leg is on top. Use your healthy leg to straighten your right / left knee until you feel a mild tension on the backside of your knee.  Hold for __________ seconds. Repeat __________ times. Complete __________ times per day. STRETCH - Knee Flexion, Supine  Lie on the floor with your  right / left heel and foot lightly touching the wall. (Place both feet on the wall if you do not use a door frame.)  Without using any effort, allow gravity to slide your foot down the wall slowly until you feel a gentle stretch in the front of your right / left knee.  Hold this stretch for __________ seconds. Then return the leg to the starting position, using your healthy leg for help, if needed. Repeat __________ times. Complete this stretch __________ times per day.  STRETCH - Knee Extension Sitting  Sit with your right / left leg/heel propped on another chair, coffee table, or foot stool.  Allow  your leg muscles to relax, letting gravity straighten out your knee.*  You should feel a stretch behind your right / left knee. Hold this position for __________ seconds. Repeat __________ times. Complete this stretch __________ times per day.  *Your physician, physical therapist or athletic trainer may instruct you place a __________ weight on your thigh, just above your kneecap, to deepen the stretch.  STRENGTHENING EXERCISES - Meniscus Tear, Non-operative, Phase I These exercises may help you when beginning to rehabilitate your injury. They may resolve your symptoms with or without further involvement from your physician, physical therapist or athletic trainer. While completing these exercises, remember:   Muscles can gain both the endurance and the strength needed for everyday activities through controlled exercises.  Complete these exercises as instructed by your physician, physical therapist or athletic trainer. Progress the resistance and repetitions only as guided. STRENGTH - Quadriceps, Isometrics  Lie on your back with your right / left leg extended and your opposite knee bent.  Gradually tense the muscles in the front of your right / left thigh. You should see either your knee cap slide up toward your hip or increased dimpling just above the knee. This motion will push the back of the knee down toward the floor, mat, or bed on which you are lying.  Hold the muscle as tight as you can, without increasing your pain, for __________ seconds.  Relax the muscles slowly and completely between each repetition. Repeat __________ times. Complete this exercise __________ times per day.  STRENGTH - Quadriceps, Short Arcs   Lie on your back. Place a __________ inch towel roll under your right / left knee, so that the knee bends slightly.  Raise only your lower leg by tightening the muscles in the front of your thigh. Do not allow your thigh to rise.  Hold this position for __________  seconds. Repeat __________ times. Complete this exercise __________ times per day.  OPTIONAL ANKLE WEIGHTS: Begin with ____________________, but DO NOT exceed ____________________. Increase in 1 pound/0.5 kilogram increments. STRENGTH - Quadriceps, Straight Leg Raises  Quality counts! Watch for signs that the quadriceps muscle is working, to be sure you are strengthening the correct muscles and not "cheating" by substituting with healthier muscles.  Lay on your back with your right / left leg extended and your opposite knee bent.  Tense the muscles in the front of your right / left thigh. You should see either your knee cap slide up or increased dimpling just above the knee. Your thigh may even shake a bit.  Tighten these muscles even more and raise your leg 4 to 6 inches off the floor. Hold for __________ seconds.  Keeping these muscles tense, lower your leg.  Relax the muscles slowly and completely in between each repetition. Repeat __________ times. Complete this exercise __________ times per day.  STRENGTH - Hamstring, Curls   Lay on your stomach with your legs extended. (If you lay on a bed, your feet may hang over the edge.)  Tighten the muscles in the back of your thigh to bend your right / left knee up to 90 degrees. Keep your hips flat on the bed.  Hold this position for __________ seconds.  Slowly lower your leg back to the starting position. Repeat __________ times. Complete this exercise __________ times per day.  STRENGTH - Quadriceps, Squats  Stand in a door frame so that your feet and knees are in line with the frame.  Use your hands for balance, not support, on the frame.  Slowly lower your weight, bending at the hips and knees. Keep your lower legs upright so that they are parallel with the door frame. Squat only within the range that does not increase your knee pain. Never let your hips drop below your knees.  Slowly return upright, pushing with your legs, not  pulling with your hands. Repeat __________ times. Complete this exercise __________ times per day.  STRENGTH - Quad/VMO, Isometric   Sit in a chair with your right / left knee slightly bent. With your fingertips, feel the VMO muscle just above the inside of your knee. The VMO is important in controlling the position of your kneecap.  Keeping your fingertips on this muscle. Without actually moving your leg, attempt to drive your knee down as if straightening your leg. You should feel your VMO tense. If you have a difficult time, you may wish to try the same exercise on your healthy knee first.  Tense this muscle as hard as you can without increasing any knee pain.  Hold for __________ seconds. Relax the muscles slowly and completely in between each repetition. Repeat __________ times. Complete exercise __________ times per day.  Document Released: 07/09/1998 Document Revised: 09/17/2011 Document Reviewed: 10/07/2008 [facility] Patient Information 2015 [place], [place]. This information is not intended to replace advice given to you by your health care provider. Make sure you discuss any questions you have with your health care provider.

## 2015-03-01 LAB — COMPREHENSIVE METABOLIC PANEL
ALT: 14 U/L (ref 6–29)
AST: 18 U/L (ref 10–35)
Albumin: 4.1 g/dL (ref 3.6–5.1)
Alkaline Phosphatase: 96 U/L (ref 33–115)
BUN: 11 mg/dL (ref 7–25)
CALCIUM: 9.3 mg/dL (ref 8.6–10.2)
CHLORIDE: 98 mmol/L (ref 98–110)
CO2: 28 mmol/L (ref 20–31)
Creat: 1.08 mg/dL (ref 0.50–1.10)
GLUCOSE: 94 mg/dL (ref 65–99)
POTASSIUM: 3.3 mmol/L — AB (ref 3.5–5.3)
Sodium: 137 mmol/L (ref 135–146)
TOTAL PROTEIN: 7.5 g/dL (ref 6.1–8.1)
Total Bilirubin: 0.4 mg/dL (ref 0.2–1.2)

## 2015-03-01 LAB — LIPID PANEL
CHOL/HDL RATIO: 3.2 ratio (ref <=5.0)
CHOLESTEROL: 160 mg/dL (ref 125–200)
HDL: 50 mg/dL (ref >=46)
LDL Cholesterol: 81 mg/dL (ref <130)
Triglycerides: 146 mg/dL (ref <150)
VLDL: 29 mg/dL (ref <30)

## 2015-03-03 ENCOUNTER — Telehealth: Payer: Self-pay | Admitting: Family Medicine

## 2015-03-03 ENCOUNTER — Telehealth: Payer: Self-pay

## 2015-03-03 NOTE — Telephone Encounter (Signed)
She can try increasing the amlodipine by one half pill each day for a total dose of 7.5 mg. If still not improved in the next 1 week, would increase to 2 total pills for total dose of 10 mg. Occasionally this medication can cause swelling down into the ankles or legs, so if this occurs, let me know and we can look at other changes. Watch for low blood pressure symptoms such as lightheadedness especially when first getting up in the morning or standing up after sitting for a prolonged time. Let me know if she has any questions.

## 2015-03-03 NOTE — Telephone Encounter (Signed)
Patient states that her BP has not improved. She states that the highest BP is 157/99 and the lowest was 142/89. She wants to know if she can get an adjustment on her BP meds. [place].   [phone]

## 2015-03-03 NOTE — Telephone Encounter (Signed)
Dr. Carlota Raspberry should pt give this more time or can we adjust?

## 2015-03-03 NOTE — Telephone Encounter (Signed)
Dr. Carlota Raspberry   Patient is concerned regarding her BP.  Yolanda Jennings' reading is 157/99  [phone]

## 2015-03-04 NOTE — Telephone Encounter (Signed)
Pt called to follow-up on her previous phone call about her BP. I communicated Dr. Vonna Kotyk message to the pt.

## 2015-03-05 ENCOUNTER — Ambulatory Visit (INDEPENDENT_AMBULATORY_CARE_PROVIDER_SITE_OTHER): Payer: Managed Care, Other (non HMO) | Admitting: Family Medicine

## 2015-03-05 VITALS — BP 138/84 | HR 68 | Temp 98.2°F | Ht 65.0 in | Wt 180.0 lb

## 2015-03-05 DIAGNOSIS — R51 Headache: Secondary | ICD-10-CM

## 2015-03-05 DIAGNOSIS — I10 Essential (primary) hypertension: Secondary | ICD-10-CM

## 2015-03-05 DIAGNOSIS — E876 Hypokalemia: Secondary | ICD-10-CM

## 2015-03-05 DIAGNOSIS — R519 Headache, unspecified: Secondary | ICD-10-CM

## 2015-03-05 LAB — BASIC METABOLIC PANEL
BUN: 8 mg/dL (ref 7–25)
CALCIUM: 9.5 mg/dL (ref 8.6–10.2)
CO2: 28 mmol/L (ref 20–31)
CREATININE: 1.12 mg/dL — AB (ref 0.50–1.10)
Chloride: 100 mmol/L (ref 98–110)
Glucose, Bld: 100 mg/dL — ABNORMAL HIGH (ref 65–99)
Potassium: 3.5 mmol/L (ref 3.5–5.3)
SODIUM: 136 mmol/L (ref 135–146)

## 2015-03-05 MED ORDER — KETOROLAC TROMETHAMINE 60 MG/2ML IM SOLN
60.0000 mg | Freq: Once | INTRAMUSCULAR | Status: AC
  Administered 2015-03-05: 60 mg via INTRAMUSCULAR

## 2015-03-05 MED ORDER — AMLODIPINE BESYLATE 5 MG PO TABS: 7.5000 mg | ORAL_TABLET | Freq: Every day | ORAL | Status: DC

## 2015-03-05 NOTE — Patient Instructions (Signed)
You were given the injection of Toradol today which is what you received in December at the emergency room. This may help your headache, especially if this is a migraine type headache.  Your blood pressure appears better today so continue the same dose of Norvasc 7.5 mg total per day. I sent over a refill for this dose. Follow-up with me in the next 2-3 months to see how your blood pressure is doing. Sooner if worse.  If headache is not improved in the next day or 2, return for recheck here or to emergency room if any acute worsening.

## 2015-03-05 NOTE — Progress Notes (Addendum)
Subjective:    Patient ID: Yolanda Jennings, female    DOB: 11-29-1970, 44 y.o.   MRN: [id number] This chart was scribed for Yolanda Ray, MD by Marti Sleigh, Medical Scribe. This patient was seen in Room 14 and the patient's care was started a 11:59 AM.  Chief Complaint  Patient presents with  . Headache    x's 6 days Per patient HA is not improving.   Marland Kitchen Hypertension    HPI HPI Comments: Yolanda Jennings is a 44 y.o. female who presents to [facility] complaining of persistent HA for the last 6 days. She increased her amlodipine yesterday due to persistent high BP. She has taken ibuprofen, tylenol, and aleve without relief. She denies nausea, vomiting, blurry vision, phonophobia, photophobia, or other vision changes. She states she has had a HA like this in the past when her BP increased. She was treated with a shot of Toradol which relieved her sx.  She was seen five days ago for HTN, BP 157/95 at that time. Planned on monitoring her BP at home, see phone note from two days ago. She had persistent elevated readings at home, and was recommended to increase her norvask by 1/2.   Patient Active Problem List   Diagnosis Date Noted  . Obesity (BMI 30.0-34.9) 08/22/2014  . Microcytic anemia 09/03/2013  . Headache 09/03/2013  . Hypertensive urgency 08/29/2013  . Angioedema 08/28/2013  . HTN (hypertension) 08/28/2013   Past Medical History  Diagnosis Date  . Sickle cell trait   . Hypertension   . Tobacco abuse     Stopped smoking 08/24/13  . Allergy    Past Surgical History  Procedure Laterality Date  . Tubal ligation      2003  . Breast biopsy Left 2011   Allergies  Allergen Reactions  . Ace Inhibitors Swelling    Tongue swelling  . Lisinopril Swelling   Prior to Admission medications   Medication Sig Start Date End Date Taking? Authorizing Provider  amLODipine (NORVASC) 5 MG tablet Take 1 tablet (5 mg total) by mouth daily. 08/22/14  Yes Wardell Honour, MD  carvedilol  (COREG) 6.25 MG tablet TAKE 1 TABLET BY MOUTH TWICE DAILY WITH A MEAL 08/22/14  Yes Wardell Honour, MD  cloNIDine (CATAPRES) 0.1 MG tablet TAKE 1 TABLET BY MOUTH THREE TIMES DAILY 01/24/15  Yes Wardell Honour, MD  hydrochlorothiazide (HYDRODIURIL) 25 MG tablet Take 1 tablet (25 mg total) by mouth daily. 08/22/14  Yes Wardell Honour, MD  ibuprofen (ADVIL,MOTRIN) 200 MG tablet Take 600 mg by mouth every 4 (four) hours as needed for moderate pain.    Yes Historical Provider, MD   Social History   Social History  . Marital Status: Single    Spouse Name: N/A  . Number of Children: N/A  . Years of Education: N/A   Occupational History  . Not on file.   Social History Main Topics  . Smoking status: Former Smoker -- 20 years    Quit date: 08/24/2013  . Smokeless tobacco: Never Used  . Alcohol Use: Yes     Comment: occasional  . Drug Use: No  . Sexual Activity: Not on file   Other Topics Concern  . Not on file   Social History Narrative   Marital status: separated.      Children: 2 children; no grandchildren.      Lives: with two children.      Employment: Freight forwarder  Tobacco: quit 08/2013.       Alcohol:  Socially.      Exercise: sporadic.    Review of Systems  Constitutional: Negative for fever and chills.  Eyes: Negative for photophobia and visual disturbance.  Gastrointestinal: Negative for nausea and vomiting.  Neurological: Positive for headaches.       Objective:   Physical Exam  Constitutional: She is oriented to person, place, and time. She appears well-developed and well-nourished.  HENT:  Head: Normocephalic and atraumatic.  Sinuses and temples were nontender.   Eyes: Conjunctivae and EOM are normal. Pupils are equal, round, and reactive to light.  No nystagmus.   Neck: Neck supple. Carotid bruit is not present.  Cardiovascular: Normal rate, regular rhythm, normal heart sounds and intact distal pulses.   No murmur heard. Pulmonary/Chest: Effort normal  and breath sounds normal. No respiratory distress.  Abdominal: Soft. She exhibits no pulsatile midline mass. There is no tenderness.  Neurological: She is alert and oriented to person, place, and time.  Normal romberg, normal heel to toe. No pronator drift.  Skin: Skin is warm and dry.  Psychiatric: She has a normal mood and affect. Her behavior is normal.  Vitals reviewed.   Filed Vitals:   03/05/15 1107  BP: 138/84  Pulse: 68  Temp: 98.2 F (36.8 C)  TempSrc: Oral  Height: 5\' 5"  (1.651 m)  Weight: 180 lb (81.647 kg)  SpO2: 98%       Assessment & Plan:   [name] is a 44 y.o. female Nonintractable headache, unspecified chronicity pattern, unspecified headache type - Plan: ketorolac (TORADOL) injection 60 mg  - Nonfocal neuro exam. Initially thought to be due to elevated blood pressures, but blood pressure in the office is improved. Possible migraine type headache, but again no focal neurologic findings. She had relief in the emergency room for similar headache in December of last year, we'll try the same treatment with Toradol injection. Side effects and risks discussed. Return to clinic precautions if this does not improve, or ER if worse.  Essential hypertension - Plan: amLODipine (NORVASC) 5 MG tablet  -Improved control on 7.5 mg dose of amlodipine. Continue same dose of HCTZ and Catapres, follow up in next 2-3 months for repeat evaluation.  Hypokalemia  -Can increase potassium-rich foods again, repeat BMP today.  Meds ordered this encounter  Medications  . ketorolac (TORADOL) injection 60 mg    Sig:   . amLODipine (NORVASC) 5 MG tablet    Sig: Take 1.5 tablets (7.5 mg total) by mouth daily.    Dispense:  135 tablet    Refill:  1   Patient Instructions  You were given the injection of Toradol today which is what you received in December at the emergency room. This may help your headache, especially if this is a migraine type headache.  Your blood pressure  appears better today so continue the same dose of Norvasc 7.5 mg total per day. I sent over a refill for this dose. Follow-up with me in the next 2-3 months to see how your blood pressure is doing. Sooner if worse.  If headache is not improved in the next day or 2, return for recheck here or to emergency room if any acute worsening.      I personally performed the services described in this documentation, which was scribed in my presence. The recorded information has been reviewed and considered, and addended by me as needed.

## 2015-03-06 ENCOUNTER — Other Ambulatory Visit: Payer: Self-pay | Admitting: Family Medicine

## 2015-03-21 ENCOUNTER — Other Ambulatory Visit: Payer: Self-pay | Admitting: Family Medicine

## 2015-04-27 ENCOUNTER — Telehealth: Payer: Self-pay

## 2015-04-27 DIAGNOSIS — G8929 Other chronic pain: Secondary | ICD-10-CM

## 2015-04-27 DIAGNOSIS — M25461 Effusion, right knee: Secondary | ICD-10-CM

## 2015-04-27 DIAGNOSIS — M25561 Pain in right knee: Principal | ICD-10-CM

## 2015-04-27 NOTE — Telephone Encounter (Signed)
Based on message - appears this is in regards to her knee, not neurological issue. Can refer to ortho for eval of persistent R knee pain and swelling if that is her concern - please verify. Thanks.

## 2015-04-27 NOTE — Telephone Encounter (Signed)
Pt called in wanting Dr. Carlota Raspberry to know she has done everything he asked of her and her knee isn't getting any better. She would like to know if she needs to come in for another visit or if he can start the referral process for her. Please advise at [phone]

## 2015-04-27 NOTE — Telephone Encounter (Signed)
Refer to Neurology

## 2015-04-28 NOTE — Telephone Encounter (Signed)
Left voicemail letting pt know referral placed.

## 2015-05-17 ENCOUNTER — Encounter: Payer: Self-pay | Admitting: Family Medicine

## 2015-05-21 ENCOUNTER — Other Ambulatory Visit: Payer: Self-pay | Admitting: Family Medicine

## 2015-06-05 ENCOUNTER — Other Ambulatory Visit: Payer: Self-pay | Admitting: Physician Assistant

## 2015-06-25 ENCOUNTER — Other Ambulatory Visit: Payer: Self-pay | Admitting: Family Medicine

## 2015-08-02 ENCOUNTER — Telehealth: Payer: Self-pay

## 2015-08-02 NOTE — Telephone Encounter (Signed)
PATIENT STATES SHE HAD AN APPOINTMENT TO SEE DR. GREENE THIS MONTH AND WE CALLED TO CANCEL IT AND RESCHEDULED HER FOR M[address]. SHE WILL BE RUNNING OUT OF HER MEDICATIONS AND SHE DOES NOT HAVE ANY REFILLS. SHE NEEDS TO GET HER CLONIDINE 0.1 MG AND CARVEDILOL 6.25 MG FILLED. PLEASE CALL HER WHEN IT HAS BEEN DONE. BEST PHONE [phone] (CELL)  PHARMACY CHOICE IS [facility] ON [address].  [place]

## 2015-08-03 MED ORDER — CLONIDINE HCL 0.1 MG PO TABS: ORAL_TABLET | ORAL | Status: DC

## 2015-08-03 MED ORDER — CARVEDILOL 6.25 MG PO TABS: ORAL_TABLET | ORAL | Status: DC

## 2015-08-03 NOTE — Telephone Encounter (Signed)
Done. Left VM advising pt.

## 2015-08-11 ENCOUNTER — Other Ambulatory Visit: Payer: Self-pay | Admitting: Family Medicine

## 2015-09-01 ENCOUNTER — Other Ambulatory Visit: Payer: Self-pay | Admitting: Family Medicine

## 2015-09-03 ENCOUNTER — Other Ambulatory Visit: Payer: Self-pay | Admitting: Family Medicine

## 2015-09-03 ENCOUNTER — Telehealth: Payer: Self-pay

## 2015-09-03 NOTE — Telephone Encounter (Signed)
Pt is checking on the request of a medication refill and she is completely out   Best number [phone]

## 2015-09-05 ENCOUNTER — Encounter: Payer: Self-pay | Admitting: Family Medicine

## 2015-09-05 NOTE — Telephone Encounter (Signed)
Pt received medication

## 2015-09-07 ENCOUNTER — Ambulatory Visit (INDEPENDENT_AMBULATORY_CARE_PROVIDER_SITE_OTHER): Payer: Managed Care, Other (non HMO) | Admitting: Family Medicine

## 2015-09-07 ENCOUNTER — Encounter: Payer: Self-pay | Admitting: Family Medicine

## 2015-09-07 VITALS — BP 110/80 | HR 77 | Temp 98.0°F | Resp 16 | Ht 65.0 in | Wt 184.2 lb

## 2015-09-07 DIAGNOSIS — K429 Umbilical hernia without obstruction or gangrene: Secondary | ICD-10-CM

## 2015-09-07 DIAGNOSIS — Z124 Encounter for screening for malignant neoplasm of cervix: Secondary | ICD-10-CM

## 2015-09-07 DIAGNOSIS — Z23 Encounter for immunization: Secondary | ICD-10-CM | POA: Diagnosis not present

## 2015-09-07 DIAGNOSIS — H1089 Other conjunctivitis: Secondary | ICD-10-CM | POA: Diagnosis not present

## 2015-09-07 DIAGNOSIS — I10 Essential (primary) hypertension: Secondary | ICD-10-CM | POA: Diagnosis not present

## 2015-09-07 DIAGNOSIS — Z13220 Encounter for screening for lipoid disorders: Secondary | ICD-10-CM | POA: Diagnosis not present

## 2015-09-07 DIAGNOSIS — H109 Unspecified conjunctivitis: Secondary | ICD-10-CM

## 2015-09-07 DIAGNOSIS — Z0000 Encounter for general adult medical examination without abnormal findings: Secondary | ICD-10-CM

## 2015-09-07 DIAGNOSIS — A499 Bacterial infection, unspecified: Secondary | ICD-10-CM

## 2015-09-07 MED ORDER — CARVEDILOL 6.25 MG PO TABS: ORAL_TABLET | ORAL | Status: DC

## 2015-09-07 MED ORDER — POLYMYXIN B-TRIMETHOPRIM 10000-0.1 UNIT/ML-% OP SOLN: 1.0000 [drp] | Freq: Four times a day (QID) | OPHTHALMIC | Status: DC

## 2015-09-07 MED ORDER — CLONIDINE HCL 0.1 MG PO TABS: 0.1000 mg | ORAL_TABLET | Freq: Three times a day (TID) | ORAL | Status: DC

## 2015-09-07 MED ORDER — HYDROCHLOROTHIAZIDE 25 MG PO TABS: 25.0000 mg | ORAL_TABLET | Freq: Every day | ORAL | Status: DC

## 2015-09-07 MED ORDER — AMLODIPINE BESYLATE 10 MG PO TABS: 10.0000 mg | ORAL_TABLET | Freq: Every day | ORAL | Status: DC

## 2015-09-07 NOTE — Progress Notes (Signed)
Subjective:    Patient ID: Yolanda Jennings, female    DOB: 09/26/1970, 45 y.o.   MRN: [id number] By signing my name below, I, Zola Button, attest that this documentation has been prepared under the direction and in the presence of [name], MD.  Electronically Signed: Zola Button, Medical Scribe. 09/07/2015. 9:08 AM.  HPI HPI Comments: Yolanda Jennings is a 45 y.o. female who presents to the [facility] for an annual exam. Also with medication refill for hypertension and possible concern of left eye infection. Patient is not fasting today.  Cancer screening:  Cervical cancer screening - Her last pap was about 2 years ago, which was normal. No history of abnormal pap. Breast cancer screening - Mammogram in November 2015. There was a 1 cm oval mass. Had follow-up diagnostic mammogram and ultrasound near the end of last year. With [place], got a biopsy of the right breast mass. Benign fibroadenoma. Recommended right [place] in 6 months.   Immunizations: Will be receiving Tdap today. Immunization History  Administered Date(s) Administered  . Influenza-Unspecified 04/08/2014, 04/09/2015  . Tdap 09/07/2015    Depression screening:  Depression screen [facility]  2/9 09/07/2015 03/05/2015 02/28/2015  Decreased Interest 0 0 0  Down, Depressed, Hopeless 0 0 0  PHQ - 2 Score 0 0 0   Vision: 20/30 in the left eye, 20/30 in the right eye.  Visual Acuity Screening   Right eye Left eye Both eyes  Without correction:     With correction: 20/30 20/30 20/20     Dentist: She sees a dentist every year.  Exercise: She states she does not exercise as much as she should.  Hypertension: See prior visits. She takes HCTZ, Coreg, Catapres, and Norvasc. Improved control on 7.5 mg Norvasc at visit in August last year. She has been taking 10 mg Norvasc. She has been doing well on the medications, but she has noticed some leg swelling. Patient denies lightheadedness, dizziness, and chest pain. She is  not taking anything for cholesterol. She states she does not eat much fast food and drinks soda occasionally. Lab Results  Component Value Date   CHOL 160 02/28/2015   HDL 50 02/28/2015   LDLCALC 81 02/28/2015   TRIG 146 02/28/2015   CHOLHDL 3.2 02/28/2015    Hypokalemia: Discussed potassium-rich foods. Normal on August 27th BMP.   Left eye pain: She has had left eye pain with redness and discharge for the past 2 days. She has crusty yellow/green discharge in the mornings and watery discharge throughout the day. Patient denies sick contacts with conjunctivitis.   Umbilical hernia: Patient still has some tenderness to the area. The hernia is reducible. She did see a Psychologist, sport and exercise last year at [facility], but was unable to follow-up with him due to her work schedule. She is wondering if she needs another referral.  Patient Active Problem List   Diagnosis Date Noted  . Obesity (BMI 30.0-34.9) 08/22/2014  . Microcytic anemia 09/03/2013  . Headache 09/03/2013  . Hypertensive urgency 08/29/2013  . Angioedema 08/28/2013  . HTN (hypertension) 08/28/2013   Past Medical History  Diagnosis Date  . Sickle cell trait ([place])   . Hypertension   . Tobacco abuse     Stopped smoking 08/24/13  . Allergy    Past Surgical History  Procedure Laterality Date  . Tubal ligation      2003  . Breast biopsy Left 2011   Allergies  Allergen Reactions  . Ace Inhibitors Swelling  Tongue swelling  . Lisinopril Swelling   Prior to Admission medications   Medication Sig Start Date End Date Taking? Authorizing Provider  amLODipine (NORVASC) 5 MG tablet TAKE 1 AND 1/2 TABLET BY MOUTH EVERY DAY 08/13/15  Yes Chelle Jeffery, PA-C  carvedilol (COREG) 6.25 MG tablet TAKE 1 TABLET BY MOUTH TWICE DAILY WITH A MEAL 08/03/15  Yes [name], MD  cloNIDine (CATAPRES) 0.1 MG tablet TAKE 1 TABLET BY MOUTH THREE TIMES DAILY, "OFFICE VISIT NEEDED FOR REFILLS" 09/03/15  Yes Chelle Jeffery, PA-C    hydrochlorothiazide (HYDRODIURIL) 25 MG tablet TAKE 1 TABLET BY MOUTH DAILY 03/21/15  Yes [name], MD  ibuprofen (ADVIL,MOTRIN) 200 MG tablet Take 600 mg by mouth every 4 (four) hours as needed for moderate pain.    Yes Historical Provider, MD   Social History   Social History  . Marital Status: Single    Spouse Name: N/A  . Number of Children: N/A  . Years of Education: N/A   Occupational History  . Forklift operator    Social History Main Topics  . Smoking status: Former Smoker -- 20 years    Quit date: 08/24/2013  . Smokeless tobacco: Never Used  . Alcohol Use: 0.0 oz/week    0 Standard drinks or equivalent per week     Comment: occasional  . Drug Use: No  . Sexual Activity: Yes   Other Topics Concern  . Not on file   Social History Narrative   Marital status: Single      Children: 2 children; no grandchildren.      Lives: with two children.      Employment: Freight forwarder      Tobacco: quit 08/2013.       Alcohol:  Socially.      Exercise: Yes      Education: [employer].     Review of Systems  Constitutional: Negative for fatigue and unexpected weight change.  Eyes: Positive for pain and itching.  Respiratory: Negative for chest tightness and shortness of breath.   Cardiovascular: Negative for chest pain, palpitations and leg swelling.  Gastrointestinal: Negative for abdominal pain and blood in stool.  Neurological: Negative for dizziness, syncope, light-headedness and headaches.  All other systems reviewed and are negative.  13 point ROS reviewed on patient health survey. Negative other than listed above or in nursing note. See nursing note.     Objective:   Physical Exam  Eyes: EOM are normal. Pupils are equal, round, and reactive to light. Left conjunctiva is injected.  Scleral injection diffusely, left eye. Anterior chamber clear. Minimal crusting on the medial canthus.  Genitourinary:  Normal external genitalia. No bleeding. No lesions  noted. No CMT. No adnexal tenderness or apparent fullness.  Lymphadenopathy:       Head (right side): No preauricular adenopathy present.       Head (left side): No preauricular adenopathy present.    She has no cervical adenopathy.    Filed Vitals:   09/07/15 0832  BP: 110/80  Pulse: 77  Temp: 98 F (36.7 C)  TempSrc: Oral  Resp: 16  Height: 5\' 5"  (1.651 m)  Weight: 184 lb 3.2 oz (83.553 kg)  SpO2: 98%         Assessment & Plan:  { Yolanda Jennings is a 45 y.o. female Annual physical exam  --anticipatory guidance as below in AVS, screening labs above. Health maintenance items as above in HPI discussed/recommended as applicable.   -has US/MMG follow  up scheduled as above.   -pap testing performed.   Need for Tdap vaccination - Plan: Tdap vaccine greater than or equal to 46yo IM given  Essential hypertension - Plan: amLODipine (NORVASC) 10 MG tablet, carvedilol (COREG) 6.25 MG tablet, cloNIDine (CATAPRES) 0.1 MG tablet, hydrochlorothiazide (HYDRODIURIL) 25 MG tablet, COMPLETE METABOLIC PANEL WITH GFR  - stable. No med changes, labs pending.   Umbilical hernia without obstruction and without gangrene  - can call surgeon to schedule appt.  Let me know if referral needed.   Conjunctivitis, bacterial - Plan: trimethoprim-polymyxin b (POLYTRIM) ophthalmic solution  - viral vs. Bacterial with crusting during day.  Minimal seen on exam. Rx for polytrim if not improving in next 2 days. rtc precautions.   Screening for hyperlipidemia - Plan: Lipid panel  Screening for cervical cancer - Plan: Pap IG w/ reflex to HPV when ASC-U   Meds ordered this encounter  Medications  . amLODipine (NORVASC) 10 MG tablet    Sig: Take 1 tablet (10 mg total) by mouth daily.    Dispense:  90 tablet    Refill:  1  . carvedilol (COREG) 6.25 MG tablet    Sig: TAKE 1 TABLET BY MOUTH TWICE DAILY WITH A MEAL    Dispense:  180 tablet    Refill:  1  . cloNIDine (CATAPRES) 0.1 MG tablet    Sig:  Take 1 tablet (0.1 mg total) by mouth 3 (three) times daily.    Dispense:  270 tablet    Refill:  1  . hydrochlorothiazide (HYDRODIURIL) 25 MG tablet    Sig: Take 1 tablet (25 mg total) by mouth daily.    Dispense:  90 tablet    Refill:  1  . trimethoprim-polymyxin b (POLYTRIM) ophthalmic solution    Sig: Place 1 drop into the left eye every 6 (six) hours. For 1 week.    Dispense:  10 mL    Refill:  0   Patient Instructions  Keep a record of your blood pressures outside of the office and if running on lower side - can try decrease of amlodipine to 5mg . Return for fasting labs in next 1 week. You should receive a call or letter about your lab results within the next week to 10 days.  Call surgeons' office for eval of hernia. Return to the clinic or go to the nearest emergency room if any of your symptoms worsen or new symptoms occur.  If eye redness not improving in next day or two - can start eye drops.   Keeping You Healthy  Get These Tests  Blood Pressure- Have your blood pressure checked once a year by your health care provider.  Normal blood pressure is 120/80.  Weight- Have your body mass index (BMI) calculated to screen for obesity.  BMI is measure of body fat based on height and weight.  You can also calculate your own BMI at GravelBags.it.  Cholesterol- Have your cholesterol checked every 5 years starting at age 73 then yearly starting at age 57.  Chlamydia, HIV, and other sexually transmitted diseases- Get screened every year until age 75, then within three months of each new sexual provider.  Pap Test - Every 1-5 years; discuss with your health care provider.  Mammogram- Every 1-2 years starting at age 61--50  Take these medicines  Calcium with Vitamin D-Your body needs 1200 mg of Calcium each day and [phone] IU of Vitamin D daily.  Your body can only absorb 500 mg of Calcium at  a time so Calcium must be taken in 2 or 3 divided doses throughout the  day.  Multivitamin with folic acid- Once daily if it is possible for you to become pregnant.  Get these Immunizations  Gardasil-Series of three doses; prevents HPV related illness such as genital warts and cervical cancer.  Menactra-Single dose; prevents meningitis.  Tetanus shot- Every 10 years.  Flu shot-Every year.  Take these steps 1. Do not smoke-Your healthcare provider can help you quit.  For tips on how to quit go to www.smokefree.gov or call 1-800 QUITNOW. 2. Be physically active- Exercise 5 days a week for at least 30 minutes.  If you are not already physically active, start slow and gradually work up to 30 minutes of moderate physical activity.  Examples of moderate activity include walking briskly, dancing, swimming, bicycling, etc. 3. Breast Cancer- A self breast exam every month is important for early detection of breast cancer.  For more information and instruction on self breast exams, ask your healthcare provider or https://www.patel.info/. 4. Eat a healthy diet- Eat a variety of healthy foods such as fruits, vegetables, whole grains, low fat milk, low fat cheeses, yogurt, lean meats, poultry and fish, beans, nuts, tofu, etc.  For more information go to www. Thenutritionsource.org 5. Drink alcohol in moderation- Limit alcohol intake to one drink or less per day. Never drink and drive. 6. Depression- Your emotional health is as important as your physical health.  If you're feeling down or losing interest in things you normally enjoy please talk to your healthcare provider about being screened for depression. 7. Dental visit- Brush and floss your teeth twice daily; visit your dentist twice a year. 8. Eye doctor- Get an eye exam at least every 2 years. 9. Helmet use- Always wear a helmet when riding a bicycle, motorcycle, rollerblading or skateboarding. 27. Safe sex- If you may be exposed to sexually transmitted infections, use a condom. 11. Seat belts-  Seat belts can save your live; always wear one. 12. Smoke/Carbon Monoxide detectors- These detectors need to be installed on the appropriate level of your home. Replace batteries at least once a year. 13. Skin cancer- When out in the sun please cover up and use sunscreen 15 SPF or higher. 14. Violence- If anyone is threatening or hurting you, please tell your healthcare provider.    Bacterial Conjunctivitis Bacterial conjunctivitis, commonly called pink eye, is an inflammation of the clear membrane that covers the white part of the eye (conjunctiva). The inflammation can also happen on the underside of the eyelids. The blood vessels in the conjunctiva become inflamed, causing the eye to become red or pink. Bacterial conjunctivitis may spread easily from one eye to another and from person to person (contagious).  CAUSES  Bacterial conjunctivitis is caused by bacteria. The bacteria may come from your own skin, your upper respiratory tract, or from someone else with bacterial conjunctivitis. SYMPTOMS  The normally white color of the eye or the underside of the eyelid is usually pink or red. The pink eye is usually associated with irritation, tearing, and some sensitivity to light. Bacterial conjunctivitis is often associated with a thick, yellowish discharge from the eye. The discharge may turn into a crust on the eyelids overnight, which causes your eyelids to stick together. If a discharge is present, there may also be some blurred vision in the affected eye. DIAGNOSIS  Bacterial conjunctivitis is diagnosed by your caregiver through an eye exam and the symptoms that you report. Your caregiver looks  for changes in the surface tissues of your eyes, which may point to the specific type of conjunctivitis. A sample of any discharge may be collected on a cotton-tip swab if you have a severe case of conjunctivitis, if your cornea is affected, or if you keep getting repeat infections that do not respond to  treatment. The sample will be sent to a lab to see if the inflammation is caused by a bacterial infection and to see if the infection will respond to antibiotic medicines. TREATMENT   Bacterial conjunctivitis is treated with antibiotics. Antibiotic eyedrops are most often used. However, antibiotic ointments are also available. Antibiotics pills are sometimes used. Artificial tears or eye washes may ease discomfort. HOME CARE INSTRUCTIONS   To ease discomfort, apply a cool, clean washcloth to your eye for 10-20 minutes, 3-4 times a day.  Gently wipe away any drainage from your eye with a warm, wet washcloth or a cotton ball.  Wash your hands often with soap and water. Use paper towels to dry your hands.  Do not share towels or washcloths. This may spread the infection.  Change or wash your pillowcase every day.  You should not use eye makeup until the infection is gone.  Do not operate machinery or drive if your vision is blurred.  Stop using contact lenses. Ask your caregiver how to sterilize or replace your contacts before using them again. This depends on the type of contact lenses that you use.  When applying medicine to the infected eye, do not touch the edge of your eyelid with the eyedrop bottle or ointment tube. SEEK IMMEDIATE MEDICAL CARE IF:   Your infection has not improved within 3 days after beginning treatment.  You had yellow discharge from your eye and it returns.  You have increased eye pain.  Your eye redness is spreading.  Your vision becomes blurred.  You have a fever or persistent symptoms for more than 2-3 days.  You have a fever and your symptoms suddenly get worse.  You have facial pain, redness, or swelling. MAKE SURE YOU:   Understand these instructions.  Will watch your condition.  Will get help right away if you are not doing well or get worse.   This information is not intended to replace advice given to you by your health care provider.  Make sure you discuss any questions you have with your health care provider.   Document Released: 06/25/2005 Document Revised: 07/16/2014 Document Reviewed: 11/26/2011 Elsevier Interactive Patient Education [employer].      I personally performed the services described in this documentation, which was scribed in my presence. The recorded information has been reviewed and considered, and addended by me as needed.

## 2015-09-07 NOTE — Progress Notes (Signed)
   Subjective:    Patient ID: Yolanda Jennings, female    DOB: Feb 06, 1971, 45 y.o.   MRN: [id number]  HPI    Review of Systems  Constitutional: Negative.   HENT: Negative.   Eyes: Positive for pain and itching.  Respiratory: Negative.   Cardiovascular: Negative.   Gastrointestinal: Negative.   Endocrine: Negative.   Genitourinary: Negative.   Musculoskeletal: Negative.   Skin: Negative.   Allergic/Immunologic: Negative.   Neurological: Negative.   Hematological: Negative.   Psychiatric/Behavioral: Negative.        Objective:   Physical Exam        Assessment & Plan:

## 2015-09-07 NOTE — Patient Instructions (Signed)
Keep a record of your blood pressures outside of the office and if running on lower side - can try decrease of amlodipine to 5mg . Return for fasting labs in next 1 week. You should receive a call or letter about your lab results within the next week to 10 days.  Call surgeons' office for eval of hernia. Return to the clinic or go to the nearest emergency room if any of your symptoms worsen or new symptoms occur.  If eye redness not improving in next day or two - can start eye drops.   Keeping You Healthy  Get These Tests  Blood Pressure- Have your blood pressure checked once a year by your health care provider.  Normal blood pressure is 120/80.  Weight- Have your body mass index (BMI) calculated to screen for obesity.  BMI is measure of body fat based on height and weight.  You can also calculate your own BMI at [employer]Bags.it.  Cholesterol- Have your cholesterol checked every 5 years starting at age 14 then yearly starting at age 72.  Chlamydia, HIV, and other sexually transmitted diseases- Get screened every year until age 3, then within three months of each new sexual provider.  Pap Test - Every 1-5 years; discuss with your health care provider.  Mammogram- Every 1-2 years starting at age 26--50  Take these medicines  Calcium with Vitamin D-Your body needs 1200 mg of Calcium each day and [phone] IU of Vitamin D daily.  Your body can only absorb 500 mg of Calcium at a time so Calcium must be taken in 2 or 3 divided doses throughout the day.  Multivitamin with folic acid- Once daily if it is possible for you to become pregnant.  Get these Immunizations  Gardasil-Series of three doses; prevents HPV related illness such as genital warts and cervical cancer.  Menactra-Single dose; prevents meningitis.  Tetanus shot- Every 10 years.  Flu shot-Every year.  Take these steps 1. Do not smoke-Your healthcare provider can help you quit.  For tips on how to quit go to  www.smokefree.gov or call 1-800 QUITNOW. 2. Be physically active- Exercise 5 days a week for at least 30 minutes.  If you are not already physically active, start slow and gradually work up to 30 minutes of moderate physical activity.  Examples of moderate activity include walking briskly, dancing, swimming, bicycling, etc. 3. Breast Cancer- A self breast exam every month is important for early detection of breast cancer.  For more information and instruction on self breast exams, ask your healthcare provider or https://www.patel.info/. 4. Eat a healthy diet- Eat a variety of healthy foods such as fruits, vegetables, whole grains, low fat milk, low fat cheeses, yogurt, lean meats, poultry and fish, beans, nuts, tofu, etc.  For more information go to www. Thenutritionsource.org 5. Drink alcohol in moderation- Limit alcohol intake to one drink or less per day. Never drink and drive. 6. Depression- Your emotional health is as important as your physical health.  If you're feeling down or losing interest in things you normally enjoy please talk to your healthcare provider about being screened for depression. 7. Dental visit- Brush and floss your teeth twice daily; visit your dentist twice a year. 8. Eye doctor- Get an eye exam at least every 2 years. 9. Helmet use- Always wear a helmet when riding a bicycle, motorcycle, rollerblading or skateboarding. 11. Safe sex- If you may be exposed to sexually transmitted infections, use a condom. 11. Seat belts- Seat belts can save your  live; always wear one. 12. Smoke/Carbon Monoxide detectors- These detectors need to be installed on the appropriate level of your home. Replace batteries at least once a year. 13. Skin cancer- When out in the sun please cover up and use sunscreen 15 SPF or higher. 14. Violence- If anyone is threatening or hurting you, please tell your healthcare provider.    Bacterial Conjunctivitis Bacterial conjunctivitis,  commonly called pink eye, is an inflammation of the clear membrane that covers the white part of the eye (conjunctiva). The inflammation can also happen on the underside of the eyelids. The blood vessels in the conjunctiva become inflamed, causing the eye to become red or pink. Bacterial conjunctivitis may spread easily from one eye to another and from person to person (contagious).  CAUSES  Bacterial conjunctivitis is caused by bacteria. The bacteria may come from your own skin, your upper respiratory tract, or from someone else with bacterial conjunctivitis. SYMPTOMS  The normally white color of the eye or the underside of the eyelid is usually pink or red. The pink eye is usually associated with irritation, tearing, and some sensitivity to light. Bacterial conjunctivitis is often associated with a thick, yellowish discharge from the eye. The discharge may turn into a crust on the eyelids overnight, which causes your eyelids to stick together. If a discharge is present, there may also be some blurred vision in the affected eye. DIAGNOSIS  Bacterial conjunctivitis is diagnosed by your caregiver through an eye exam and the symptoms that you report. Your caregiver looks for changes in the surface tissues of your eyes, which may point to the specific type of conjunctivitis. A sample of any discharge may be collected on a cotton-tip swab if you have a severe case of conjunctivitis, if your cornea is affected, or if you keep getting repeat infections that do not respond to treatment. The sample will be sent to a lab to see if the inflammation is caused by a bacterial infection and to see if the infection will respond to antibiotic medicines. TREATMENT   Bacterial conjunctivitis is treated with antibiotics. Antibiotic eyedrops are most often used. However, antibiotic ointments are also available. Antibiotics pills are sometimes used. Artificial tears or eye washes may ease discomfort. HOME CARE INSTRUCTIONS    To ease discomfort, apply a cool, clean washcloth to your eye for 10-20 minutes, 3-4 times a day.  Gently wipe away any drainage from your eye with a warm, wet washcloth or a cotton ball.  Wash your hands often with soap and water. Use paper towels to dry your hands.  Do not share towels or washcloths. This may spread the infection.  Change or wash your pillowcase every day.  You should not use eye makeup until the infection is gone.  Do not operate machinery or drive if your vision is blurred.  Stop using contact lenses. Ask your caregiver how to sterilize or replace your contacts before using them again. This depends on the type of contact lenses that you use.  When applying medicine to the infected eye, do not touch the edge of your eyelid with the eyedrop bottle or ointment tube. SEEK IMMEDIATE MEDICAL CARE IF:   Your infection has not improved within 3 days after beginning treatment.  You had yellow discharge from your eye and it returns.  You have increased eye pain.  Your eye redness is spreading.  Your vision becomes blurred.  You have a fever or persistent symptoms for more than 2-3 days.  You have a  fever and your symptoms suddenly get worse.  You have facial pain, redness, or swelling. MAKE SURE YOU:   Understand these instructions.  Will watch your condition.  Will get help right away if you are not doing well or get worse.   This information is not intended to replace advice given to you by your health care provider. Make sure you discuss any questions you have with your health care provider.   Document Released: 06/25/2005 Document Revised: 07/16/2014 Document Reviewed: 11/26/2011 Elsevier Interactive Patient Education [employer].

## 2015-09-08 LAB — PAP IG W/ RFLX HPV ASCU

## 2015-09-18 ENCOUNTER — Other Ambulatory Visit: Payer: Self-pay | Admitting: Family Medicine

## 2015-09-24 ENCOUNTER — Ambulatory Visit (INDEPENDENT_AMBULATORY_CARE_PROVIDER_SITE_OTHER): Payer: Managed Care, Other (non HMO) | Admitting: Family Medicine

## 2015-09-24 ENCOUNTER — Other Ambulatory Visit: Payer: Self-pay | Admitting: *Deleted

## 2015-09-24 ENCOUNTER — Encounter: Payer: Self-pay | Admitting: Family Medicine

## 2015-09-24 VITALS — BP 130/74 | HR 80 | Temp 98.1°F | Resp 12 | Ht 65.0 in | Wt 186.0 lb

## 2015-09-24 DIAGNOSIS — E876 Hypokalemia: Secondary | ICD-10-CM | POA: Diagnosis not present

## 2015-09-24 DIAGNOSIS — I10 Essential (primary) hypertension: Secondary | ICD-10-CM

## 2015-09-24 DIAGNOSIS — Z13220 Encounter for screening for lipoid disorders: Secondary | ICD-10-CM | POA: Diagnosis not present

## 2015-09-24 DIAGNOSIS — R51 Headache: Secondary | ICD-10-CM

## 2015-09-24 DIAGNOSIS — R519 Headache, unspecified: Secondary | ICD-10-CM

## 2015-09-24 LAB — COMPLETE METABOLIC PANEL WITH GFR
ALBUMIN: 4.2 g/dL (ref 3.6–5.1)
ALK PHOS: 94 U/L (ref 33–115)
ALT: 12 U/L (ref 6–29)
AST: 17 U/L (ref 10–35)
BUN: 9 mg/dL (ref 7–25)
CALCIUM: 9.9 mg/dL (ref 8.6–10.2)
CO2: 27 mmol/L (ref 20–31)
Chloride: 101 mmol/L (ref 98–110)
Creat: 1.1 mg/dL (ref 0.50–1.10)
GFR, EST AFRICAN AMERICAN: 70 mL/min (ref >=60)
GFR, EST NON AFRICAN AMERICAN: 61 mL/min (ref >=60)
Glucose, Bld: 108 mg/dL — ABNORMAL HIGH (ref 65–99)
POTASSIUM: 3.3 mmol/L — AB (ref 3.5–5.3)
Sodium: 138 mmol/L (ref 135–146)
Total Bilirubin: 0.5 mg/dL (ref 0.2–1.2)
Total Protein: 7.9 g/dL (ref 6.1–8.1)

## 2015-09-24 LAB — LIPID PANEL
CHOL/HDL RATIO: 3.4 ratio (ref <=5.0)
CHOLESTEROL: 186 mg/dL (ref 125–200)
HDL: 54 mg/dL (ref >=46)
LDL Cholesterol: 112 mg/dL (ref <130)
TRIGLYCERIDES: 99 mg/dL (ref <150)
VLDL: 20 mg/dL (ref <30)

## 2015-09-24 MED ORDER — KETOROLAC TROMETHAMINE 60 MG/2ML IM SOLN
60.0000 mg | Freq: Once | INTRAMUSCULAR | Status: AC
  Administered 2015-09-24: 60 mg via INTRAMUSCULAR

## 2015-09-24 MED ORDER — AMOXICILLIN-POT CLAVULANATE 875-125 MG PO TABS: 1.0000 | ORAL_TABLET | Freq: Two times a day (BID) | ORAL | Status: DC

## 2015-09-24 NOTE — Patient Instructions (Addendum)
     IF you received an x-ray today, you will receive an invoice from [facility] Rad[facility]. Please contact [facility] Radiology at [phone] with questions or concerns regarding your invoice.   IF you received labwork today, you will receive an invoice from [employer]. Please contact [facility] at [phone] with questions or concerns regarding your invoice.   Our billing staff will not be able to assist you with questions regarding bills from these companies.  You will be contacted with the lab results as soon as they are available. The fastest way to get your results is to activate your My Chart account. Instructions are located on the last page of this paperwork. If you have not heard from [place] regarding the results in 2 weeks, please contact this office.     Will treat with Toradol injection today for possible migraine, but if headache is not improved with this, can start Augmentin as your congestion the past few weeks may have led into a sinus infection. Return to clinic if worsening symptoms.  We will let you know about your lab results within the next week to 10 days.  Return to the clinic or go to the nearest emergency room if any of your symptoms worsen or new symptoms occur.

## 2015-09-24 NOTE — Progress Notes (Signed)
Subjective:    Patient ID: Yolanda Jennings, female    DOB: 07/06/1971, 45 y.o.   MRN: [id number] By signing my name below, I, Judithe Modest, attest that this documentation has been prepared under the direction and in the presence of [name], MD. Electronically Signed: Judithe Modest, ER Scribe. 09/24/2015. 9:41 AM.  Chief Complaint  Patient presents with  . Labs Only  . Migraine    4 days    HPI HPI Comments: Yolanda Jennings is a 45 y.o. female with a hx of HTN and hypokalemia who presents to [facility] complaining of HA for last three days. Her BP has been running high, which she associates with her HA. Home BP readings ranged from 112-141/74-91. She was last seen March 1st for a physical. Her blood pressure was controlled at that time. She was not fasting at that physical, so she is here today for fasting blood work. She is also complaining of a HA today. She had borderline kidney function but normal potassium in August, 2016. She denies nausea, vomiting, weakness in extremities, fever, chills or visual changes. She had a slight cold over the last two weeks. She continues to have some mild nasal congestion.   She was thought to have a migraine HA on August 27th. She was treated with Toradol which improved her sx.   Patient Active Problem List   Diagnosis Date Noted  . Obesity (BMI 30.0-34.9) 08/22/2014  . Microcytic anemia 09/03/2013  . Headache 09/03/2013  . Hypertensive urgency 08/29/2013  . Angioedema 08/28/2013  . HTN (hypertension) 08/28/2013   Past Medical History  Diagnosis Date  . Sickle cell trait ([place])   . Hypertension   . Tobacco abuse     Stopped smoking 08/24/13  . Allergy    Past Surgical History  Procedure Laterality Date  . Tubal ligation      2003  . Breast biopsy Left 2011   Allergies  Allergen Reactions  . Ace Inhibitors Swelling    Tongue swelling  . Lisinopril Swelling   Prior to Admission medications   Medication Sig Start Date  End Date Taking? Authorizing Provider  amLODipine (NORVASC) 10 MG tablet Take 1 tablet (10 mg total) by mouth daily. 09/07/15  Yes [name], MD  carvedilol (COREG) 6.25 MG tablet TAKE 1 TABLET BY MOUTH TWICE DAILY WITH A MEAL 09/07/15  Yes [name], MD  cloNIDine (CATAPRES) 0.1 MG tablet Take 1 tablet (0.1 mg total) by mouth 3 (three) times daily. 09/07/15  Yes [name], MD  hydrochlorothiazide (HYDRODIURIL) 25 MG tablet Take 1 tablet (25 mg total) by mouth daily. 09/07/15  Yes [name], MD  ibuprofen (ADVIL,MOTRIN) 200 MG tablet Take 600 mg by mouth every 4 (four) hours as needed for moderate pain.    Yes Historical Provider, MD  trimethoprim-polymyxin b (POLYTRIM) ophthalmic solution Place 1 drop into the left eye every 6 (six) hours. For 1 week. 09/07/15  Yes [name], MD   Social History   Social History  . Marital Status: Single    Spouse Name: N/A  . Number of Children: N/A  . Years of Education: N/A   Occupational History  . Forklift operator    Social History Main Topics  . Smoking status: Former Smoker -- 20 years    Quit date: 08/24/2013  . Smokeless tobacco: Never Used  . Alcohol Use: 0.0 oz/week    0 Standard drinks or equivalent per week  Comment: occasional  . Drug Use: No  . Sexual Activity: Yes   Other Topics Concern  . Not on file   Social History Narrative   Marital status: Single      Children: 2 children; no grandchildren.      Lives: with two children.      Employment: Freight forwarder      Tobacco: quit 08/2013.       Alcohol:  Socially.      Exercise: Yes      Education: [employer].    Review of Systems  Constitutional: Negative for fever and chills.  HENT: Positive for congestion and rhinorrhea.   Eyes: Negative for photophobia.  Respiratory: Negative for cough.   Gastrointestinal: Negative for nausea and vomiting.  Neurological: Positive for headaches.       No phonophobia     Objective:  BP 130/74 mmHg   Pulse 80  Temp(Src) 98.1 F (36.7 C) (Oral)  Resp 12  Ht 5\' 5"  (1.651 m)  Wt 186 lb (84.369 kg)  BMI 30.95 kg/m2  SpO2 98%  LMP 09/19/2015  Physical Exam  Constitutional: She is oriented to person, place, and time. She appears well-developed and well-nourished. No distress.  HENT:  Head: Normocephalic and atraumatic.  Mouth/Throat: Oropharynx is clear and moist. No oropharyngeal exudate.  No frontal or maxillary sinus tenderness. No lymphadenopathy.   Eyes: EOM are normal. Pupils are equal, round, and reactive to light.  No nystagmus.  Neck: Neck supple. No thyromegaly present.  Cardiovascular: Normal rate, regular rhythm and normal heart sounds.   No murmur heard. Pulmonary/Chest: Effort normal. No respiratory distress.  Musculoskeletal: Normal range of motion.  Lymphadenopathy:    She has no cervical adenopathy.  Neurological: She is alert and oriented to person, place, and time. Coordination normal.  Skin: Skin is warm and dry. She is not diaphoretic.  Psychiatric: She has a normal mood and affect. Her behavior is normal.  Nursing note and vitals reviewed.     Assessment & Plan:   Yolanda Jennings is a 46 y.o. female Essential hypertension  -Stable. Doubt this to be the cause of her headaches. Continue same medications for now, monitor home BPs  Frontal headache - Plan: ketorolac (TORADOL) injection 60 mg, amoxicillin-clavulanate (AUGMENTIN) 875-125 MG tablet  -Possible migraine versus sinusitis. No pain with percussion of frontal sinuses, but if not improved with Toradol injection, can fill Augmentin. Side effects discussed. RTC precautions.   Hypokalemia  -History of hypokalemia, normal when most recently checked. Labs pending.  Meds ordered this encounter  Medications  . ketorolac (TORADOL) injection 60 mg    Sig:   . amoxicillin-clavulanate (AUGMENTIN) 875-125 MG tablet    Sig: Take 1 tablet by mouth 2 (two) times daily.    Dispense:  20 tablet    Refill:  0    Patient Instructions       IF you received an x-ray today, you will receive an invoice from [facility]. Please contact [facility] Radiology at [phone] with questions or concerns regarding your invoice.   IF you received labwork today, you will receive an invoice from [employer]. Please contact [facility] at [phone] with questions or concerns regarding your invoice.   Our billing staff will not be able to assist you with questions regarding bills from these companies.  You will be contacted with the lab results as soon as they are available. The fastest way to get your results is to activate your My Chart account.  Instructions are located on the last page of this paperwork. If you have not heard from [place] regarding the results in 2 weeks, please contact this office.     Will treat with Toradol injection today for possible migraine, but if headache is not improved with this, can start Augmentin as your congestion the past few weeks may have led into a sinus infection. Return to clinic if worsening symptoms.  We will let you know about your lab results within the next week to 10 days.  Return to the clinic or go to the nearest emergency room if any of your symptoms worsen or new symptoms occur.     I personally performed the services described in this documentation, which was scribed in my presence. The recorded information has been reviewed and considered, and addended by me as needed.

## 2015-09-27 ENCOUNTER — Telehealth: Payer: Self-pay | Admitting: Family Medicine

## 2015-09-27 NOTE — Telephone Encounter (Signed)
Patient already had the flu shot.

## 2015-10-01 ENCOUNTER — Telehealth: Payer: Self-pay

## 2015-10-01 ENCOUNTER — Other Ambulatory Visit: Payer: Self-pay | Admitting: Physician Assistant

## 2015-10-01 NOTE — Telephone Encounter (Signed)
Pt is in need of a blood pressure medication refill -not sure of the name   Best number [phone]

## 2015-10-11 NOTE — Addendum Note (Signed)
Addended by: [name] R on: 10/11/2015 01:06 PM   Modules accepted: Orders

## 2015-11-07 ENCOUNTER — Telehealth: Payer: Self-pay

## 2015-11-07 NOTE — Telephone Encounter (Signed)
[place] - Pt was wanting to know if she could get a handicapped placard due to her knee problems. If she needs to come back in she will. Please call  [phone]

## 2015-11-08 NOTE — Telephone Encounter (Signed)
Spoke with pt, she is being treated by orthopedics and they will not give her one unless she has a total knee replacement. She has a heel spur and 2%cartilage damage in her knee.

## 2015-11-08 NOTE — Telephone Encounter (Signed)
Sure. I don't mind completing that. Just place a blank form in my box.  Remind me, is she still being followed by orthopedics, and what is the most recent diagnosis for her knee?

## 2015-11-18 NOTE — Telephone Encounter (Signed)
Completed and signed. Returned to the nurses' box.

## 2015-11-18 NOTE — Telephone Encounter (Signed)
Can someone sign this is Dr. Rolly Salter absence. In the nurses box.

## 2015-11-19 NOTE — Telephone Encounter (Signed)
Pt.notified

## 2016-02-05 ENCOUNTER — Emergency Department (HOSPITAL_COMMUNITY)
Admission: EM | Admit: 2016-02-05 | Discharge: 2016-02-06 | Disposition: A | Discharge status: Home / Self Care | Payer: Managed Care, Other (non HMO) | Attending: Emergency Medicine | Admitting: Emergency Medicine

## 2016-02-05 ENCOUNTER — Emergency Department (HOSPITAL_COMMUNITY): Payer: Managed Care, Other (non HMO)

## 2016-02-05 ENCOUNTER — Encounter (HOSPITAL_COMMUNITY): Payer: Self-pay | Admitting: Nurse Practitioner

## 2016-02-05 DIAGNOSIS — E876 Hypokalemia: Secondary | ICD-10-CM | POA: Diagnosis not present

## 2016-02-05 DIAGNOSIS — R0789 Other chest pain: Secondary | ICD-10-CM | POA: Insufficient documentation

## 2016-02-05 DIAGNOSIS — M62838 Other muscle spasm: Secondary | ICD-10-CM

## 2016-02-05 DIAGNOSIS — M62830 Muscle spasm of back: Secondary | ICD-10-CM | POA: Diagnosis not present

## 2016-02-05 DIAGNOSIS — Z87891 Personal history of nicotine dependence: Secondary | ICD-10-CM | POA: Insufficient documentation

## 2016-02-05 DIAGNOSIS — R0602 Shortness of breath: Secondary | ICD-10-CM | POA: Diagnosis not present

## 2016-02-05 DIAGNOSIS — Z79899 Other long term (current) drug therapy: Secondary | ICD-10-CM | POA: Diagnosis not present

## 2016-02-05 DIAGNOSIS — Z791 Long term (current) use of non-steroidal anti-inflammatories (NSAID): Secondary | ICD-10-CM | POA: Insufficient documentation

## 2016-02-05 DIAGNOSIS — I10 Essential (primary) hypertension: Secondary | ICD-10-CM | POA: Insufficient documentation

## 2016-02-05 LAB — I-STAT TROPONIN, ED
TROPONIN I, POC: 0 ng/mL (ref 0.00–0.08)
Troponin i, poc: 0 ng/mL (ref 0.00–0.08)

## 2016-02-05 LAB — CBC
HEMATOCRIT: 40.1 % (ref 36.0–46.0)
Hemoglobin: 13.9 g/dL (ref 12.0–15.0)
MCH: 26.6 pg (ref 26.0–34.0)
MCHC: 34.7 g/dL (ref 30.0–36.0)
MCV: 76.8 fL — AB (ref 78.0–100.0)
PLATELETS: 372 10*3/uL (ref 150–400)
RBC: 5.22 MIL/uL — ABNORMAL HIGH (ref 3.87–5.11)
RDW: 14.3 % (ref 11.5–15.5)
WBC: 5.8 10*3/uL (ref 4.0–10.5)

## 2016-02-05 LAB — BASIC METABOLIC PANEL
Anion gap: 9 (ref 5–15)
BUN: 9 mg/dL (ref 6–20)
CHLORIDE: 101 mmol/L (ref 101–111)
CO2: 26 mmol/L (ref 22–32)
CREATININE: 1.05 mg/dL — AB (ref 0.44–1.00)
Calcium: 9.4 mg/dL (ref 8.9–10.3)
GFR calc Af Amer: >60 mL/min (ref >60)
GLUCOSE: 106 mg/dL — AB (ref 65–99)
POTASSIUM: 2.8 mmol/L — AB (ref 3.5–5.1)
Sodium: 136 mmol/L (ref 135–145)

## 2016-02-05 MED ORDER — POTASSIUM CHLORIDE CRYS ER 20 MEQ PO TBCR
40.0000 meq | EXTENDED_RELEASE_TABLET | Freq: Once | ORAL | Status: AC
  Administered 2016-02-05: 40 meq via ORAL
  Filled 2016-02-05: qty 2

## 2016-02-05 MED ORDER — DIPHENHYDRAMINE HCL 50 MG/ML IJ SOLN: 12.5000 mg | Freq: Once | INTRAMUSCULAR | Status: DC

## 2016-02-05 MED ORDER — KETOROLAC TROMETHAMINE 30 MG/ML IJ SOLN
30.0000 mg | Freq: Once | INTRAMUSCULAR | Status: AC
  Administered 2016-02-05: 30 mg via INTRAVENOUS
  Filled 2016-02-05: qty 1

## 2016-02-05 MED ORDER — POTASSIUM CHLORIDE 10 MEQ/100ML IV SOLN
10.0000 meq | Freq: Once | INTRAVENOUS | Status: AC
  Administered 2016-02-05: 10 meq via INTRAVENOUS
  Filled 2016-02-05: qty 100

## 2016-02-05 MED ORDER — METHOCARBAMOL 500 MG PO TABS
750.0000 mg | ORAL_TABLET | Freq: Once | ORAL | Status: AC
  Administered 2016-02-05: 750 mg via ORAL
  Filled 2016-02-05: qty 2

## 2016-02-05 MED ORDER — KETOROLAC TROMETHAMINE 30 MG/ML IJ SOLN: 30.0000 mg | Freq: Once | INTRAMUSCULAR | Status: DC

## 2016-02-05 MED ORDER — FENTANYL CITRATE (PF) 100 MCG/2ML IJ SOLN
50.0000 ug | Freq: Once | INTRAMUSCULAR | Status: AC
  Administered 2016-02-05: 50 ug via INTRAVENOUS
  Filled 2016-02-05: qty 2

## 2016-02-05 MED ORDER — SODIUM CHLORIDE 0.9 % IV SOLN
Freq: Once | INTRAVENOUS | Status: AC
  Administered 2016-02-05: 21:00:00 via INTRAVENOUS

## 2016-02-05 MED ORDER — METOCLOPRAMIDE HCL 5 MG/ML IJ SOLN: 10.0000 mg | Freq: Once | INTRAMUSCULAR | Status: DC

## 2016-02-05 MED ORDER — SODIUM CHLORIDE 0.9 % IV BOLUS (SEPSIS)
1000.0000 mL | Freq: Once | INTRAVENOUS | Status: AC
  Administered 2016-02-05: 1000 mL via INTRAVENOUS

## 2016-02-05 NOTE — ED Triage Notes (Signed)
Pt reports severe chest pain radiating to the back that is causing her to be short of breath, states onset was 30 mins PTA, adds that this has happened before, a long time ago and they found out I had collapsed lung." Denies trauma or strenuous activity.

## 2016-02-05 NOTE — ED Provider Notes (Signed)
[facility] DEPT Provider Note   CSN: [id number] Arrival date & time: 02/05/16  1910  First Provider Contact:  None    By signing my name below, I, Julien Nordmann, attest that this documentation has been prepared under the direction and in the presence of Junius Creamer, NP.  Electronically Signed: Julien Nordmann, ED Scribe. 02/05/16. 8:49 PM.    History   Chief Complaint Chief Complaint  Patient presents with  . Chest Pain  . Shortness of Breath    The history is provided by the patient. No language interpreter was used.   HPI Comments: Yolanda Jennings is a 45 y.o. female who has a PMHx of HTN presents to the Emergency Department complaining of sudden onset, gradual worsening, moderate, left thoracic back pain that radiates to her chest onset 2.5 hours PTA. She notes associated SOB. Pt says she was looking in a kitchen drawer when she heard a pop and felt a sudden sharp pain her back. She is worried that she may have a collapsed lung due to having similar pain in the past and that was the diagnosis. Pt has not taken any medication to alleviate her pain. She denies trauma or strenuous activity, MVC or upper back injury.  Past Medical History:  Diagnosis Date  . Allergy   . Hypertension   . Sickle cell trait ([place])   . Tobacco abuse    Stopped smoking 08/24/13    Patient Active Problem List   Diagnosis Date Noted  . Obesity (BMI 30.0-34.9) 08/22/2014  . Microcytic anemia 09/03/2013  . Headache 09/03/2013  . Hypertensive urgency 08/29/2013  . Angioedema 08/28/2013  . HTN (hypertension) 08/28/2013    Past Surgical History:  Procedure Laterality Date  . BREAST BIOPSY Left 2011  . TUBAL LIGATION     2003    OB History    Gravida Para Term Preterm AB Living   2 2 2     2    SAB TAB Ectopic Multiple Live Births                   Home Medications    Prior to Admission medications   Medication Sig Start Date End Date Taking? Authorizing Provider  amLODipine  (NORVASC) 10 MG tablet Take 1 tablet (10 mg total) by mouth daily. 09/07/15  Yes [name], MD  carvedilol (COREG) 6.25 MG tablet Take 6.25 mg by mouth 2 (two) times daily with a meal.   Yes Historical Provider, MD  cloNIDine (CATAPRES) 0.1 MG tablet Take 1 tablet (0.1 mg total) by mouth 3 (three) times daily. 09/07/15  Yes [name], MD  hydrochlorothiazide (HYDRODIURIL) 25 MG tablet Take 1 tablet (25 mg total) by mouth daily. 09/07/15  Yes [name], MD  ibuprofen (ADVIL,MOTRIN) 200 MG tablet Take 600 mg by mouth every 4 (four) hours as needed for headache, mild pain or moderate pain.    Yes Historical Provider, MD  amoxicillin-clavulanate (AUGMENTIN) 875-125 MG tablet Take 1 tablet by mouth 2 (two) times daily. Patient not taking: Reported on 02/05/2016 09/24/15   [name], MD  trimethoprim-polymyxin b [facility]) ophthalmic solution Place 1 drop into the left eye every 6 (six) hours. For 1 week. Patient not taking: Reported on 02/05/2016 09/07/15   [name], MD    Family History Family History  Problem Relation Age of Onset  . Cancer Mother     lung & cervical  . Hypertension Mother   . Alcohol abuse Father   .  Hypertension Brother     Social History Social History  Substance Use Topics  . Smoking status: Former Smoker    Years: 20.00    Quit date: 08/24/2013  . Smokeless tobacco: Never Used  . Alcohol use 0.0 oz/week     Comment: occasional     Allergies   Ace inhibitors and Lisinopril   Review of Systems Review of Systems  Respiratory: Positive for shortness of breath. Negative for cough and choking.   Cardiovascular: Positive for chest pain. Negative for palpitations.  Gastrointestinal: Negative for nausea.  Musculoskeletal: Positive for back pain.  Skin: Negative for rash.  All other systems reviewed and are negative.    Physical Exam Updated Vital Signs BP 128/83 (BP Location: Right Arm)   Pulse 75   Temp 98.9 F (37.2 C) (Oral)    Resp 20   LMP 02/01/2016   SpO2 100%   Physical Exam  Constitutional: She is oriented to person, place, and time. She appears well-developed and well-nourished.  HENT:  Head: Normocephalic.  Eyes: EOM are normal.  Neck: Normal range of motion.  Cardiovascular: Normal rate and regular rhythm.   Pulmonary/Chest: Effort normal and breath sounds normal. No respiratory distress. She has no wheezes.     She exhibits tenderness.  Abdominal: She exhibits no distension.  Musculoskeletal: Normal range of motion. She exhibits tenderness.  Tenderness upper the left scapula made worse with elevation and lateral extension of left arm  Neurological: She is alert and oriented to person, place, and time.  Psychiatric: She has a normal mood and affect.  Nursing note and vitals reviewed.    ED Treatments / Results  DIAGNOSTIC STUDIES: Oxygen Saturation is 100% on RA, normal by my interpretation.  COORDINATION OF CARE:  8:45 PM Discussed treatment plan with pt at bedside and pt agreed to plan.  Labs (all labs ordered are listed, but only abnormal results are displayed) Labs Reviewed  BASIC METABOLIC PANEL - Abnormal; Notable for the following:       Result Value   Potassium 2.8 (*)    Glucose, Bld 106 (*)    Creatinine, Ser 1.05 (*)    All other components within normal limits  CBC - Abnormal; Notable for the following:    RBC 5.22 (*)    MCV 76.8 (*)    All other components within normal limits  I-STAT TROPOININ, ED    EKG  EKG Interpretation None       Radiology Dg Chest 2 View  Result Date: 02/05/2016 CLINICAL DATA:  Left posterior chest pain, initial encounter EXAM: CHEST  2 VIEW COMPARISON:  None. FINDINGS: The heart size and mediastinal contours are within normal limits. Both lungs are clear. Prominent epicardial fat pad is noted on the right stable from previous exams. The visualized skeletal structures are unremarkable. IMPRESSION: No active cardiopulmonary disease.  Electronically Signed   By: Inez Catalina M.D.   On: 02/05/2016 19:57   Procedures Procedures (including critical care time)  Medications Ordered in ED Medications - No data to display   Initial Impression / Assessment and Plan / ED Course  I have reviewed the triage vital signs and the nursing notes.  Pertinent labs & imaging results that were available during my care of the patient were reviewed by me and considered in my medical decision making (see chart for details).  Clinical Course     Patient appears uncomfortable, pain increases with certain movements and positions  Will be given pain control Potassium supplement  and obtain 1 Troponin  Level Troponin 0   Some relief of pain still made worse with movement  Patient states vaginal discharge that her pain is much improved.  She has been given potassium supplement prescription dietary guidelines instructions to follow-up with her primary care physician for recheck of potassium in approximately a week to 10 days.  She's also been given a prescription for pain control and muscle relaxer and taken out of work several days Final Clinical Impressions(s) / ED Diagnoses   Final diagnoses:  None   I personally performed the services described in this documentation, which was scribed in my presence. The recorded information has been reviewed and is accurate. New Prescriptions New Prescriptions   No medications on file     Junius Creamer, NP 02/05/16 [id number]    Junius Creamer, NP 02/06/16 [id number]    Carmin Muskrat, MD 02/06/16 [phone]

## 2016-02-06 MED ORDER — HYDROCODONE-ACETAMINOPHEN 5-325 MG PO TABS: 1.0000 | ORAL_TABLET | Freq: Four times a day (QID) | ORAL | 0 refills | Status: DC | PRN

## 2016-02-06 MED ORDER — POTASSIUM CHLORIDE CRYS ER 20 MEQ PO TBCR: 20.0000 meq | EXTENDED_RELEASE_TABLET | Freq: Once | ORAL | 0 refills | Status: DC

## 2016-02-06 MED ORDER — METHOCARBAMOL 500 MG PO TABS: 500.0000 mg | ORAL_TABLET | Freq: Two times a day (BID) | ORAL | 0 refills | Status: DC

## 2016-02-08 ENCOUNTER — Ambulatory Visit (INDEPENDENT_AMBULATORY_CARE_PROVIDER_SITE_OTHER): Payer: Managed Care, Other (non HMO) | Admitting: Family Medicine

## 2016-02-08 VITALS — BP 130/88 | HR 74 | Temp 97.9°F | Resp 17 | Ht 65.0 in | Wt 184.0 lb

## 2016-02-08 DIAGNOSIS — M546 Pain in thoracic spine: Secondary | ICD-10-CM

## 2016-02-08 DIAGNOSIS — M549 Dorsalgia, unspecified: Secondary | ICD-10-CM

## 2016-02-08 DIAGNOSIS — E876 Hypokalemia: Secondary | ICD-10-CM | POA: Diagnosis not present

## 2016-02-08 MED ORDER — POTASSIUM CHLORIDE CRYS ER 20 MEQ PO TBCR: 20.0000 meq | EXTENDED_RELEASE_TABLET | Freq: Every day | ORAL | 2 refills | Status: DC

## 2016-02-08 NOTE — Patient Instructions (Addendum)
Based on your exam today, I believe you can return back to work. However if you have any difficulty looking to the side or behind, weakness or numbness in your arm, do not drive or operate machinery. Additionally if you are requiring sedating medicines such as a muscle relaxant or pain medication, you should also not drive or operate machinery. You can use the muscle relaxant at night as long as you're not having sedation the morning after taking that. Additionally you can use an anti-inflammatory such as ibuprofen if needed short-term.  Continue potassium supplement once per day. Return in 2 weeks for lab only visit to see if we need to adjust the dose.  IF you received an x-ray today, you will receive an invoice from [facility]. Please contact [facility] Radiology at [phone] with questions or concerns regarding your invoice.   IF you received labwork today, you will receive an invoice from [employer]. Please contact [facility] at [phone] with questions or concerns regarding your invoice.   Our billing staff will not be able to assist you with questions regarding bills from these companies.  You will be contacted with the lab results as soon as they are available. The fastest way to get your results is to activate your My Chart account. Instructions are located on the last page of this paperwork. If you have not heard from [place] regarding the results in 2 weeks, please contact this office.

## 2016-02-08 NOTE — Progress Notes (Signed)
Subjective:  By signing my name below, I, Yolanda Jennings, attest that this documentation has been prepared under the direction and in the presence of Yolanda Ray, MD.  Electronically Signed: Thea Jennings, ED Scribe. 02/08/2016. 5:05 PM.   Patient ID: Yolanda Jennings, female    DOB: July 14, 1970, 45 y.o.   MRN: [id number]  HPI Chief Complaint  Patient presents with   Follow-up    PT has paperwork to fill out to return to work     HPI Comments: Yolanda Jennings is a 45 y.o. female who presents to the [facility] for a follow up. She was seen in the ED on 7/30 for sudden onset of chest wall and back pain noted after looking into a kitchen draw, heard a pop and felt a pain in her back.  She had reproducible pain over left upper scapula. Lab work was significant for a potassium of 2.8 (hx of hypokaliemia  see prior visit with me). She had a CXR, no acute findings. Norma; troponin, overall normal CBC. She was started on potassium 20 meq once a day as well as #10 hydrocodone and #20 robaxin 500mg .   She has been taking robaxin without adverse effects. She is also taking Hydrocodone as needed, 2 doses so far and potassium. She denies CP and palpitations. She still has some left shoulder pain but states it is bearable. She reports reproducible pain when turning her head to the left.  Pt would like to return to work. She works as a Games developer. Occasional heavy lifting at work but is able to ask for help if needed. She has been taking Robaxin during the day since she's been at home.  Hypokalemia  Prev potassium in march was 3.3 and 3.5 in 02/2015.plan in march was to eat potassium rich foods but no supplements.    Patient Active Problem List   Diagnosis Date Noted   Obesity (BMI 30.0-34.9) 08/22/2014   Microcytic anemia 09/03/2013   Headache 09/03/2013   Hypertensive urgency 08/29/2013   Angioedema 08/28/2013   HTN (hypertension) 08/28/2013   Past Medical History:    Diagnosis Date   Allergy    Hypertension    Sickle cell trait ([place])    Tobacco abuse    Stopped smoking 08/24/13   Past Surgical History:  Procedure Laterality Date   BREAST BIOPSY Left 2011   TUBAL LIGATION     2003   Allergies  Allergen Reactions   Ace Inhibitors Swelling and Other (See Comments)    Reaction:  Tongue swelling    Lisinopril Swelling and Other (See Comments)    Reaction:  Tongue swelling    Prior to Admission medications   Medication Sig Start Date End Date Taking? Authorizing Provider  amLODipine (NORVASC) 10 MG tablet Take 1 tablet (10 mg total) by mouth daily. 09/07/15  Yes [name], MD  amoxicillin-clavulanate (AUGMENTIN) 875-125 MG tablet Take 1 tablet by mouth 2 (two) times daily. 09/24/15  Yes [name], MD  carvedilol (COREG) 6.25 MG tablet Take 6.25 mg by mouth 2 (two) times daily with a meal.   Yes Historical Provider, MD  cloNIDine (CATAPRES) 0.1 MG tablet Take 1 tablet (0.1 mg total) by mouth 3 (three) times daily. 09/07/15  Yes [name], MD  hydrochlorothiazide (HYDRODIURIL) 25 MG tablet Take 1 tablet (25 mg total) by mouth daily. 09/07/15  Yes [name], MD  HYDROcodone-acetaminophen (NORCO/VICODIN) 5-325 MG tablet Take 1 tablet by mouth every  6 (six) hours as needed. 02/06/16  Yes Junius Creamer, NP  ibuprofen (ADVIL,MOTRIN) 200 MG tablet Take 600 mg by mouth every 4 (four) hours as needed for headache, mild pain or moderate pain.    Yes Historical Provider, MD  methocarbamol (ROBAXIN) 500 MG tablet Take 1 tablet (500 mg total) by mouth 2 (two) times daily. 02/06/16  Yes Junius Creamer, NP  trimethoprim-polymyxin b (POLYTRIM) ophthalmic solution Place 1 drop into the left eye every 6 (six) hours. For 1 week. 09/07/15  Yes [name], MD  potassium chloride SA (K-DUR,KLOR-CON) 20 MEQ tablet Take 1 tablet (20 mEq total) by mouth once. 02/06/16 02/06/16  Junius Creamer, NP   Social History   Social History   Marital status:  Single    Spouse name: N/A   Number of children: N/A   Years of education: N/A   Occupational History   Freight forwarder    Social History Main Topics   Smoking status: Former Smoker    Years: 20.00    Quit date: 08/24/2013   Smokeless tobacco: Never Used   Alcohol use 0.0 oz/week     Comment: occasional   Drug use: No   Sexual activity: Yes   Other Topics Concern   Not on file   Social History Narrative   Marital status: Single      Children: 2 children; no grandchildren.      Lives: with two children.      Employment: Freight forwarder      Tobacco: quit 08/2013.       Alcohol:  Socially.      Exercise: Yes      Education: [employer].    Review of Systems  Cardiovascular: Negative for chest pain and palpitations.  Musculoskeletal: Positive for myalgias.       Objective:   Physical Exam  Constitutional: She is oriented to person, place, and time. She appears well-developed and well-nourished.  HENT:  Head: Normocephalic.  Eyes: Conjunctivae are normal.  Neck: Normal range of motion. Neck supple.  Cardiovascular: Normal rate, regular rhythm and normal heart sounds.  Exam reveals no gallop and no friction rub.   No murmur heard. Pulmonary/Chest: Breath sounds normal. She has no wheezes. She has no rales.  Musculoskeletal: She exhibits no edema.  Tender over left rhomboid. Some spasm of left rhomboid muscle. No midline tpine tenderness.  Slight reproduction left lateral flexion otherwise ROM intact. Strength in UE are equal an intact.  Neurological: She is alert and oriented to person, place, and time.  Reflex Scores:      Tricep reflexes are 2+ on the right side and 2+ on the left side.      Bicep reflexes are 2+ on the right side and 2+ on the left side.      Brachioradialis reflexes are 2+ on the right side and 2+ on the left side. Skin: Skin is warm and dry.  Psychiatric: She has a normal mood and affect. Her behavior is normal.  Nursing note and  vitals reviewed.  Vitals:   02/08/16 1655  BP: 130/88  Pulse: 74  Resp: 17  Temp: 97.9 F (36.6 C)  TempSrc: Oral  SpO2: 97%  Weight: 184 lb (83.5 kg)  Height: 5\' 5"  (1.651 m)    Assessment & Plan:   Yolanda Jennings is a 45 y.o. female Upper back pain on left side  -Muscle strain versus radicular pain from neck. Improving, has adequate range of motion and strength to return  to work. 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 RTC precautions if persistent.  Hypokalemia - Plan: Basic metabolic panel, potassium chloride SA (K-DUR,KLOR-CON) 20 MEQ tablet, Basic metabolic panel  - Continue potassium 20 mEq daily, BMP pending.   Meds ordered this encounter  Medications   potassium chloride SA (K-DUR,KLOR-CON) 20 MEQ tablet    Sig: Take 1 tablet (20 mEq total) by mouth daily.    Dispense:  30 tablet    Refill:  2   Patient Instructions   Based on your exam today, I believe you can return back to work. However if you have any difficulty looking to the side or behind, weakness or numbness in your arm, do not drive or operate machinery. Additionally if you are requiring sedating medicines such as a muscle relaxant or pain medication, you should also not drive or operate machinery. You can use the muscle relaxant at night as long as you're not having sedation the morning after taking that. Additionally you can use an anti-inflammatory such as ibuprofen if needed short-term.  Continue potassium supplement once per day. Return in 2 weeks for lab only visit to see if we need to adjust the dose.  IF you received an x-Jennings today, you will receive an invoice from [facility]. Please contact [facility] at [phone] with questions or concerns regarding your invoice.   IF you received labwork today, you will receive an invoice from [employer].  Please contact [facility] at [phone] with questions or concerns regarding your invoice.   Our billing staff will not be able to assist you with questions regarding bills from these companies.  You will be contacted with the lab results as soon as they are available. The fastest way to get your results is to activate your My Chart account. Instructions are located on the last page of this paperwork. If you have not heard from [place] regarding the results in 2 weeks, please contact this office.        I personally performed the services described in this documentation, which was scribed in my presence. The recorded information has been reviewed and considered, and addended by me as needed.   Signed,   Yolanda Ray, MD [facility].  02/10/16 8:22 AM

## 2016-02-09 LAB — BASIC METABOLIC PANEL
BUN: 9 mg/dL (ref 7–25)
CALCIUM: 9.8 mg/dL (ref 8.6–10.2)
CHLORIDE: 104 mmol/L (ref 98–110)
CO2: 24 mmol/L (ref 20–31)
CREATININE: 1.28 mg/dL — AB (ref 0.50–1.10)
Glucose, Bld: 86 mg/dL (ref 65–99)
Potassium: 4.2 mmol/L (ref 3.5–5.3)
Sodium: 137 mmol/L (ref 135–146)

## 2016-03-04 ENCOUNTER — Other Ambulatory Visit: Payer: Self-pay | Admitting: Family Medicine

## 2016-03-04 DIAGNOSIS — I10 Essential (primary) hypertension: Secondary | ICD-10-CM

## 2016-03-10 ENCOUNTER — Other Ambulatory Visit: Payer: Self-pay | Admitting: Family Medicine

## 2016-03-10 DIAGNOSIS — I10 Essential (primary) hypertension: Secondary | ICD-10-CM

## 2016-04-16 ENCOUNTER — Ambulatory Visit (INDEPENDENT_AMBULATORY_CARE_PROVIDER_SITE_OTHER): Payer: Managed Care, Other (non HMO) | Admitting: Family Medicine

## 2016-04-16 VITALS — BP 138/84 | HR 69 | Temp 98.1°F | Resp 16 | Ht 64.5 in | Wt 186.0 lb

## 2016-04-16 DIAGNOSIS — I10 Essential (primary) hypertension: Secondary | ICD-10-CM

## 2016-04-16 DIAGNOSIS — Z13220 Encounter for screening for lipoid disorders: Secondary | ICD-10-CM

## 2016-04-16 DIAGNOSIS — K429 Umbilical hernia without obstruction or gangrene: Secondary | ICD-10-CM

## 2016-04-16 DIAGNOSIS — R7989 Other specified abnormal findings of blood chemistry: Secondary | ICD-10-CM

## 2016-04-16 DIAGNOSIS — E876 Hypokalemia: Secondary | ICD-10-CM

## 2016-04-16 LAB — LIPID PANEL
CHOL/HDL RATIO: 2.8 ratio (ref <=5.0)
Cholesterol: 191 mg/dL (ref 125–200)
HDL: 68 mg/dL (ref >=46)
LDL CALC: 105 mg/dL (ref <130)
Triglycerides: 90 mg/dL (ref <150)
VLDL: 18 mg/dL (ref <30)

## 2016-04-16 LAB — COMPLETE METABOLIC PANEL WITH GFR
ALBUMIN: 4.2 g/dL (ref 3.6–5.1)
ALT: 10 U/L (ref 6–29)
AST: 14 U/L (ref 10–35)
Alkaline Phosphatase: 102 U/L (ref 33–115)
BUN: 9 mg/dL (ref 7–25)
CO2: 26 mmol/L (ref 20–31)
CREATININE: 1.15 mg/dL — AB (ref 0.50–1.10)
Calcium: 9.6 mg/dL (ref 8.6–10.2)
Chloride: 98 mmol/L (ref 98–110)
GFR, EST NON AFRICAN AMERICAN: 57 mL/min — AB (ref >=60)
GFR, Est African American: 66 mL/min (ref >=60)
GLUCOSE: 90 mg/dL (ref 65–99)
Potassium: 3.3 mmol/L — ABNORMAL LOW (ref 3.5–5.3)
SODIUM: 135 mmol/L (ref 135–146)
TOTAL PROTEIN: 8.1 g/dL (ref 6.1–8.1)
Total Bilirubin: 0.4 mg/dL (ref 0.2–1.2)

## 2016-04-16 MED ORDER — AMLODIPINE BESYLATE 10 MG PO TABS: 10.0000 mg | ORAL_TABLET | Freq: Every day | ORAL | 1 refills | Status: DC

## 2016-04-16 MED ORDER — CARVEDILOL 6.25 MG PO TABS: ORAL_TABLET | ORAL | 1 refills | Status: DC

## 2016-04-16 MED ORDER — HYDROCHLOROTHIAZIDE 25 MG PO TABS: 25.0000 mg | ORAL_TABLET | Freq: Every day | ORAL | 1 refills | Status: DC

## 2016-04-16 MED ORDER — CLONIDINE HCL 0.1 MG PO TABS: 0.1000 mg | ORAL_TABLET | Freq: Three times a day (TID) | ORAL | 1 refills | Status: DC

## 2016-04-16 NOTE — Progress Notes (Addendum)
By signing my name below I, Tereasa Coop, attest that this documentation has been prepared under the direction and in the presence of [name], MD. Electonically Signed. Tereasa Coop, Scribe 04/16/2016 at 4:58 PM  Subjective:    Patient ID: Yolanda Jennings, female    DOB: 03-Jul-1971, 45 y.o.   MRN: [id number]  Chief Complaint  Patient presents with  . Medication Refill    norvasc; HCTZ    HPI Yolanda Jennings is a 45 y.o. female who presents to the [facility] for follow up reevaluation of pt's HTN and norvasc and HCTZ prescription refill. Pt states that her BP is more elevated then usual because she has not had her BP medication today. Pt states that her BP has been well controlled at home and is normally lower than it is today. Pt denies any having any side effects to her medication. Pt denies any CP, chest tightness, SOB, dizziness, or HAs.   Pt last seen in August 2017 for back pain. Pt's BP was 130/88 at that time.   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  Pt reports that she has an umbilical hernia that has been evaluated by a surgeon and told that as long as her hernia is reducible it is fine. Pt states that it is reducible and is not bothering her.   Pt last ate at 7AM this morning.     Patient Active Problem List   Diagnosis Date Noted  . Obesity (BMI 30.0-34.9) 08/22/2014  . Microcytic anemia 09/03/2013  . Headache 09/03/2013  . Hypertensive urgency 08/29/2013  . Angioedema 08/28/2013  . HTN (hypertension) 08/28/2013   Past Medical History:  Diagnosis Date  . Allergy   . Hypertension   . Sickle cell trait ([place])   . Tobacco abuse    Stopped smoking 08/24/13   Past Surgical History:  Procedure Laterality Date  . BREAST BIOPSY Left 2011  . TUBAL LIGATION     2003   Allergies  Allergen Reactions  . Ace Inhibitors Swelling and Other  (See Comments)    Reaction:  Tongue swelling   . Lisinopril Swelling and Other (See Comments)    Reaction:  Tongue swelling    Prior to Admission medications   Medication Sig Start Date End Date Taking? Authorizing Provider  amLODipine (NORVASC) 10 MG tablet Take 1 tablet (10 mg total) by mouth daily. 09/07/15  Yes [name], MD  carvedilol (COREG) 6.25 MG tablet TAKE 1 TABLET BY MOUTH TWICE DAILY WITH A MEAL 03/06/16  Yes [name], MD  cloNIDine (CATAPRES) 0.1 MG tablet Take 1 tablet (0.1 mg total) by mouth 3 (three) times daily. 09/07/15  Yes [name], MD  hydrochlorothiazide (HYDRODIURIL) 25 MG tablet TAKE 1 TABLET BY MOUTH EVERY DAY 03/12/16  Yes [name], MD  ibuprofen (ADVIL,MOTRIN) 200 MG tablet Take 600 mg by mouth every 4 (four) hours as needed for headache, mild pain or moderate pain.    Yes Historical Provider, MD  potassium chloride SA (K-DUR,KLOR-CON) 20 MEQ tablet Take 1 tablet (20 mEq total) by mouth daily. Patient not taking: Reported on 04/16/2016 02/08/16   [name], MD   Social History   Social History  . Marital status: Single    Spouse name: N/A  . Number of children: N/A  . Years of education: N/A  Occupational History  . Forklift operator    Social History Main Topics  . Smoking status: Former Smoker    Years: 20.00    Quit date: 08/24/2013  . Smokeless tobacco: Never Used  . Alcohol use 0.0 oz/week     Comment: occasional  . Drug use: No  . Sexual activity: Yes   Other Topics Concern  . Not on file   Social History Narrative   Marital status: Single      Children: 2 children; no grandchildren.      Lives: with two children.      Employment: Freight forwarder      Tobacco: quit 08/2013.       Alcohol:  Socially.      Exercise: Yes      Education: [employer].      Review of Systems  Constitutional: Negative for fatigue, fever and unexpected weight change.  Respiratory: Negative for chest tightness and shortness  of breath.   Cardiovascular: Negative for chest pain, palpitations and leg swelling.  Gastrointestinal: Negative for abdominal pain and blood in stool.  Neurological: Negative for dizziness, syncope, light-headedness and headaches.       Objective:   Physical Exam  Constitutional: She is oriented to person, place, and time. She appears well-developed and well-nourished.  HENT:  Head: Normocephalic and atraumatic.  Eyes: Conjunctivae and EOM are normal. Pupils are equal, round, and reactive to light.  Neck: Carotid bruit is not present.  Cardiovascular: Normal rate, regular rhythm, normal heart sounds and intact distal pulses.  Exam reveals no gallop and no friction rub.   No murmur heard. Pulmonary/Chest: Effort normal and breath sounds normal. She has no wheezes. She has no rhonchi. She has no rales.  Abdominal: Soft. She exhibits no pulsatile midline mass. There is no tenderness. A hernia (easily reducible umbilical hernia) is present.  Neurological: She is alert and oriented to person, place, and time.  Skin: Skin is warm and dry.  Psychiatric: She has a normal mood and affect. Her behavior is normal.  Vitals reviewed.    Vitals:   04/16/16 1616  BP: 138/84  Pulse: 69  Resp: 16  Temp: 98.1 F (36.7 C)  TempSrc: Oral  SpO2: 100%  Weight: 186 lb (84.4 kg)  Height: 5' 4.5" (1.638 m)         Assessment & Plan:   Yolanda Jennings is a 45 y.o. female Essential hypertension - Plan: COMPLETE METABOLIC PANEL WITH GFR, Lipid panel, hydrochlorothiazide (HYDRODIURIL) 25 MG tablet, cloNIDine (CATAPRES) 0.1 MG tablet, carvedilol (COREG) 6.25 MG tablet, amLODipine (NORVASC) 10 MG tablet  Elevated serum creatinine - Plan: COMPLETE METABOLIC PANEL WITH GFR, Lipid panel  Screening for hyperlipidemia - Plan: Lipid panel  Umbilical hernia without obstruction and without gangrene  Hypertension overall controlled. We'll continue same dose of medications as above. Check CMP, lipid  panel for screening for hyperlipidemia. Repeat serum creatinine testing, avoid nephrotoxins. Recheck 6 months for physical, sooner if needed.   Easily reducible umbilical hernia. Previously referred to general surgery. Hernia precautions discussed, and can follow-up with surgeon when she is ready to have that repaired.  Meds ordered this encounter  Medications  . hydrochlorothiazide (HYDRODIURIL) 25 MG tablet    Sig: Take 1 tablet (25 mg total) by mouth daily.    Dispense:  90 tablet    Refill:  1  . cloNIDine (CATAPRES) 0.1 MG tablet    Sig: Take 1 tablet (0.1 mg total) by mouth 3 (three) times daily.  Dispense:  270 tablet    Refill:  1  . carvedilol (COREG) 6.25 MG tablet    Sig: TAKE 1 TABLET BY MOUTH TWICE DAILY WITH A MEAL    Dispense:  180 tablet    Refill:  1  . amLODipine (NORVASC) 10 MG tablet    Sig: Take 1 tablet (10 mg total) by mouth daily.    Dispense:  90 tablet    Refill:  1   Patient Instructions    No change in medications for now. Keep an eye on your blood pressure outside of the office. Make sure you stay well hydrated, and avoid medicines such as ibuprofen or Aleve if possible as this can also affect her kidney function. I will recheck your kidney function as well as cholesterol today.   IF you received an x-ray today, you will receive an invoice from [facility]. Please contact [facility] at [phone] with questions or concerns regarding your invoice.   IF you received labwork today, you will receive an invoice from [employer]. Please contact [facility] at [phone] with questions or concerns regarding your invoice.   Our billing staff will not be able to assist you with questions regarding bills from these companies.  You will be contacted with the lab results as soon as they are available. The fastest way to get your results is to activate your My Chart account. Instructions are located on the last page of  this paperwork. If you have not heard from [place] regarding the results in 2 weeks, please contact this office.       I personally performed the services described in this documentation, which was scribed in my presence. The recorded information has been reviewed and considered, and addended by me as needed.   Signed,   [name], MD [facility].  04/16/16 5:21 PM

## 2016-04-16 NOTE — Patient Instructions (Addendum)
  No change in medications for now. Keep an eye on your blood pressure outside of the office. Make sure you stay well hydrated, and avoid medicines such as ibuprofen or Aleve if possible as this can also affect her kidney function. I will recheck your kidney function as well as cholesterol today.   IF you received an x-ray today, you will receive an invoice from [facility]. Please contact [facility] Radiology at [phone] with questions or concerns regarding your invoice.   IF you received labwork today, you will receive an invoice from [employer]. Please contact [facility] at [phone] with questions or concerns regarding your invoice.   Our billing staff will not be able to assist you with questions regarding bills from these companies.  You will be contacted with the lab results as soon as they are available. The fastest way to get your results is to activate your My Chart account. Instructions are located on the last page of this paperwork. If you have not heard from [place] regarding the results in 2 weeks, please contact this office.

## 2016-04-25 NOTE — Addendum Note (Signed)
Addended by: [name] R on: 04/25/2016 02:27 PM   Modules accepted: Orders

## 2016-07-05 ENCOUNTER — Other Ambulatory Visit: Payer: Self-pay | Admitting: Family Medicine

## 2016-07-17 LAB — HM MAMMOGRAPHY

## 2016-07-20 ENCOUNTER — Encounter: Payer: Self-pay | Admitting: Family Medicine

## 2016-07-30 ENCOUNTER — Encounter: Payer: Self-pay | Admitting: Family Medicine

## 2016-07-30 ENCOUNTER — Other Ambulatory Visit: Payer: Self-pay | Admitting: Radiology

## 2016-07-30 LAB — HM MAMMOGRAPHY: HM Mammogram: ABNORMAL — AB (ref 0–4)

## 2016-08-06 ENCOUNTER — Encounter: Payer: Self-pay | Admitting: Family Medicine

## 2016-08-28 ENCOUNTER — Ambulatory Visit (HOSPITAL_COMMUNITY)
Admission: EM | Admit: 2016-08-28 | Discharge: 2016-08-28 | Disposition: A | Discharge status: Home / Self Care | Payer: Managed Care, Other (non HMO) | Attending: Family Medicine | Admitting: Family Medicine

## 2016-08-28 ENCOUNTER — Encounter (HOSPITAL_COMMUNITY): Payer: Self-pay | Admitting: Family Medicine

## 2016-08-28 DIAGNOSIS — G43009 Migraine without aura, not intractable, without status migrainosus: Secondary | ICD-10-CM | POA: Diagnosis not present

## 2016-08-28 MED ORDER — METOCLOPRAMIDE HCL 5 MG/ML IJ SOLN
10.0000 mg | Freq: Once | INTRAMUSCULAR | Status: AC
  Administered 2016-08-28: 10 mg via INTRAMUSCULAR

## 2016-08-28 MED ORDER — KETOROLAC TROMETHAMINE 30 MG/ML IJ SOLN
INTRAMUSCULAR | Status: AC
  Filled 2016-08-28: qty 1

## 2016-08-28 MED ORDER — KETOROLAC TROMETHAMINE 30 MG/ML IJ SOLN
30.0000 mg | Freq: Once | INTRAMUSCULAR | Status: AC
  Administered 2016-08-28: 30 mg via INTRAMUSCULAR

## 2016-08-28 MED ORDER — METOCLOPRAMIDE HCL 5 MG/ML IJ SOLN
INTRAMUSCULAR | Status: AC
  Filled 2016-08-28: qty 2

## 2016-08-28 MED ORDER — TRAZODONE HCL 100 MG PO TABS: 100.0000 mg | ORAL_TABLET | Freq: Every day | ORAL | 0 refills | Status: DC

## 2016-08-28 NOTE — ED Triage Notes (Signed)
Pt here for frontal headache x 3 days.

## 2016-08-28 NOTE — Discharge Instructions (Signed)
Use medicine as needed, return or see your doctor if further problems

## 2016-08-28 NOTE — ED Provider Notes (Signed)
[facility]    CSN: [id number] Arrival date & time: 08/28/16  1732     History   Chief Complaint Chief Complaint  Patient presents with  . Headache    HPI Yolanda Jennings is a 46 y.o. female.   The history is provided by the patient.  Headache  Pain location:  Frontal Quality:  Sharp Radiates to:  Does not radiate Onset quality:  Gradual Duration:  3 days Progression:  Unchanged Chronicity:  Chronic Similar to prior headaches: yes   Context: not activity, not exposure to bright light and not stress   Relieved by:  None tried Worsened by:  Nothing Ineffective treatments:  None tried Associated symptoms: no abdominal pain, no back pain, no blurred vision, no dizziness, no fever, no loss of balance, no nausea, no neck pain, no neck stiffness, no photophobia and no weakness   Risk factors: no anger and does not have insomnia     Past Medical History:  Diagnosis Date  . Allergy   . Hypertension   . Sickle cell trait ([place])   . Tobacco abuse    Stopped smoking 08/24/13    Patient Active Problem List   Diagnosis Date Noted  . Obesity (BMI 30.0-34.9) 08/22/2014  . Microcytic anemia 09/03/2013  . Headache 09/03/2013  . Hypertensive urgency 08/29/2013  . Angioedema 08/28/2013  . HTN (hypertension) 08/28/2013    Past Surgical History:  Procedure Laterality Date  . BREAST BIOPSY Left 2011  . TUBAL LIGATION     2003    OB History    Gravida Para Term Preterm AB Living   2 2 2     2    SAB TAB Ectopic Multiple Live Births           2       Home Medications    Prior to Admission medications   Medication Sig Start Date End Date Taking? Authorizing Provider  amLODipine (NORVASC) 10 MG tablet Take 1 tablet (10 mg total) by mouth daily. 04/16/16   [name], MD  carvedilol (COREG) 6.25 MG tablet TAKE 1 TABLET BY MOUTH TWICE DAILY WITH A MEAL 04/16/16   [name], MD  cloNIDine (CATAPRES) 0.1 MG tablet Take 1 tablet (0.1 mg total) by  mouth 3 (three) times daily. 04/16/16   [name], MD  hydrochlorothiazide (HYDRODIURIL) 25 MG tablet Take 1 tablet (25 mg total) by mouth daily. 04/16/16   [name], MD  ibuprofen (ADVIL,MOTRIN) 200 MG tablet Take 600 mg by mouth every 4 (four) hours as needed for headache, mild pain or moderate pain.     Historical Provider, MD  traZODone (DESYREL) 100 MG tablet Take 1 tablet (100 mg total) by mouth at bedtime. For headache 08/28/16   Billy Fischer, MD    Family History Family History  Problem Relation Age of Onset  . Cancer Mother     lung & cervical  . Hypertension Mother   . Alcohol abuse Father   . Hypertension Brother     Social History Social History  Substance Use Topics  . Smoking status: Former Smoker    Years: 20.00    Quit date: 08/24/2013  . Smokeless tobacco: Never Used  . Alcohol use 0.0 oz/week     Comment: occasional     Allergies   Ace inhibitors and Lisinopril   Review of Systems Review of Systems  Constitutional: Negative.  Negative for fever.  HENT: Negative.   Eyes: Negative for  blurred vision and photophobia.  Gastrointestinal: Negative.  Negative for abdominal pain and nausea.  Genitourinary: Negative.   Musculoskeletal: Negative for back pain, neck pain and neck stiffness.  Neurological: Positive for headaches. Negative for dizziness, tremors, syncope, speech difficulty, weakness, light-headedness and loss of balance.  All other systems reviewed and are negative.    Physical Exam Triage Vital Signs ED Triage Vitals  Enc Vitals Group     BP 08/28/16 1819 135/82     Pulse Rate 08/28/16 1819 83     Resp 08/28/16 1819 18     Temp 08/28/16 1819 98.5 F (36.9 C)     Temp src --      SpO2 08/28/16 1819 100 %     Weight --      Height --      Head Circumference --      Peak Flow --      Pain Score 08/28/16 1818 6     Pain Loc --      Pain Edu? --      Excl. in [place]? --    No data found.   Updated Vital Signs BP 135/82    Pulse 83   Temp 98.5 F (36.9 C)   Resp 18   SpO2 100%   Visual Acuity Right Eye Distance:   Left Eye Distance:   Bilateral Distance:    Right Eye Near:   Left Eye Near:    Bilateral Near:     Physical Exam  Constitutional: She is oriented to person, place, and time. She appears well-developed and well-nourished. No distress.  HENT:  Head: Normocephalic.  Eyes: Conjunctivae and EOM are normal. Pupils are equal, round, and reactive to light.  Neck: Normal range of motion. Neck supple.  Cardiovascular: Normal rate and regular rhythm.   Abdominal: Soft. Bowel sounds are normal.  Lymphadenopathy:    She has no cervical adenopathy.  Neurological: She is alert and oriented to person, place, and time.  Skin: Skin is warm and dry.  Nursing note and vitals reviewed.    UC Treatments / Results  Labs (all labs ordered are listed, but only abnormal results are displayed) Labs Reviewed - No data to display  EKG  EKG Interpretation None       Radiology No results found.  Procedures Procedures (including critical care time)  Medications Ordered in UC Medications  ketorolac (TORADOL) 30 MG/ML injection 30 mg (not administered)  metoCLOPramide (REGLAN) injection 10 mg (not administered)     Initial Impression / Assessment and Plan / UC Course  I have reviewed the triage vital signs and the nursing notes.  Pertinent labs & imaging results that were available during my care of the patient were reviewed by me and considered in my medical decision making (see chart for details).       Final Clinical Impressions(s) / UC Diagnoses   Final diagnoses:  Migraine without aura and without status migrainosus, not intractable    New Prescriptions New Prescriptions   TRAZODONE (DESYREL) 100 MG TABLET    Take 1 tablet (100 mg total) by mouth at bedtime. For headache     Billy Fischer, MD 08/28/16 [phone]

## 2016-09-01 ENCOUNTER — Ambulatory Visit (INDEPENDENT_AMBULATORY_CARE_PROVIDER_SITE_OTHER): Payer: Managed Care, Other (non HMO) | Admitting: Family Medicine

## 2016-09-01 VITALS — BP 120/74 | HR 79 | Temp 98.5°F | Ht 64.5 in | Wt 182.8 lb

## 2016-09-01 DIAGNOSIS — J0120 Acute ethmoidal sinusitis, unspecified: Secondary | ICD-10-CM

## 2016-09-01 MED ORDER — AMOXICILLIN 875 MG PO TABS
875.0000 mg | ORAL_TABLET | Freq: Two times a day (BID) | ORAL | 0 refills | Status: DC
Start: 2016-09-01 — End: 2017-04-26

## 2016-09-01 NOTE — Patient Instructions (Addendum)
Take the Amoxicillin once in the AM and once in the PM for the next 10 days.   You do have a sinus infection.  This should get you cleared up.  If you're not doing better in the next 10 - 14 days, come back and see [place].  If you're feeling worse, don't wait.    Sinusitis, Adult Sinusitis is soreness and inflammation of your sinuses. Sinuses are hollow spaces in the bones around your face. They are located:  Around your eyes.  In the middle of your forehead.  Behind your nose.  In your cheekbones. Your sinuses and nasal passages are lined with a stringy fluid (mucus). Mucus normally drains out of your sinuses. When your nasal tissues get inflamed or swollen, the mucus can get trapped or blocked so air cannot flow through your sinuses. This lets bacteria, viruses, and funguses grow, and that leads to infection. Follow these instructions at home: Medicines  Take, use, or apply over-the-counter and prescription medicines only as told by your doctor. These may include nasal sprays.  If you were prescribed an antibiotic medicine, take it as told by your doctor. Do not stop taking the antibiotic even if you start to feel better. Hydrate and Humidify  Drink enough water to keep your pee (urine) clear or pale yellow.  Use a cool mist humidifier to keep the humidity level in your home above 50%.  Breathe in steam for 10-15 minutes, 3-4 times a day or as told by your doctor. You can do this in the bathroom while a hot shower is running.  Try not to spend time in cool or dry air. Rest  Rest as much as possible.  Sleep with your head raised (elevated).  Make sure to get enough sleep each night. General instructions  Put a warm, moist washcloth on your face 3-4 times a day or as told by your doctor. This will help with discomfort.  Wash your hands often with soap and water. If there is no soap and water, use hand sanitizer.  Do not smoke. Avoid being around people who are smoking  (secondhand smoke).  Keep all follow-up visits as told by your doctor. This is important. Contact a doctor if:  You have a fever.  Your symptoms get worse.  Your symptoms do not get better within 10 days. Get help right away if:  You have a very bad headache.  You cannot stop throwing up (vomiting).  You have pain or swelling around your face or eyes.  You have trouble seeing.  You feel confused.  Your neck is stiff.  You have trouble breathing. This information is not intended to replace advice given to you by your health care provider. Make sure you discuss any questions you have with your health care provider. Document Released: 12/12/2007 Document Revised: 02/19/2016 Document Reviewed: 04/20/2015 Elsevier Interactive Patient Education  2017 [employer].     IF you received an x-ray today, you will receive an invoice from [facility]. Please contact [facility] at [phone] with questions or concerns regarding your invoice.   IF you received labwork today, you will receive an invoice from [place]. Please contact [facility] at [phone] with questions or concerns regarding your invoice.   Our billing staff will not be able to assist you with questions regarding bills from these companies.  You will be contacted with the lab results as soon as they are available. The fastest way to get your results is to activate your My Chart  account. Instructions are located on the last page of this paperwork. If you have not heard from [place] regarding the results in 2 weeks, please contact this office.

## 2016-09-01 NOTE — Progress Notes (Signed)
   Yolanda Jennings is a 46 y.o. female who presents to Primary Care at [facility] today for sinus congestion:  1.  Sinus congestion:  Started 3-4 days ago as intense pressure behind eyes.  Presented to Urgent Care and diagnosed with migraine, given Toradol which provided 1 days' relief but pressure back the next day.  Describes sinus congestion plus difficulty breathing through nose.  Hasn't tried anything else for relief.  No fevers or chills.  Did have recent URI which which lasted a few days that started about 1 week prior to her sinus pressure symptoms starting.  That was mostly runny nose, nasal drainage, sore throat, cough.  Those symptoms have all resolved, no just sinus congestion.    No vision changes.  No real headache. No neck stiffness.  ROS as above.    PMH reviewed. Patient is a nonsmoker.   Past Medical History:  Diagnosis Date  . Allergy   . Hypertension   . Sickle cell trait ([place])   . Tobacco abuse    Stopped smoking 08/24/13   Past Surgical History:  Procedure Laterality Date  . BREAST BIOPSY Left 2011  . TUBAL LIGATION     2003    Medications reviewed. Current Outpatient Prescriptions  Medication Sig Dispense Refill  . amLODipine (NORVASC) 10 MG tablet Take 1 tablet (10 mg total) by mouth daily. 90 tablet 1  . carvedilol (COREG) 6.25 MG tablet TAKE 1 TABLET BY MOUTH TWICE DAILY WITH A MEAL 180 tablet 1  . cloNIDine (CATAPRES) 0.1 MG tablet Take 1 tablet (0.1 mg total) by mouth 3 (three) times daily. 270 tablet 1  . hydrochlorothiazide (HYDRODIURIL) 25 MG tablet Take 1 tablet (25 mg total) by mouth daily. 90 tablet 1  . ibuprofen (ADVIL,MOTRIN) 200 MG tablet Take 600 mg by mouth every 4 (four) hours as needed for headache, mild pain or moderate pain.     . traZODone (DESYREL) 100 MG tablet Take 1 tablet (100 mg total) by mouth at bedtime. For headache 30 tablet 0   No current facility-administered medications for this visit.      Physical Exam:  BP 120/74  (BP Location: Right Arm, Patient Position: Sitting, Cuff Size: Small)   Pulse 79   Temp 98.5 F (36.9 C) (Oral)   Ht 5' 4.5" (1.638 m)   Wt 182 lb 12.8 oz (82.9 kg)   LMP 08/05/2016 (Approximate)   SpO2 99%   BMI 30.89 kg/m  Gen:  Patient sitting on exam table, appears stated age in no acute distress Head: Normocephalic atraumatic Eyes: EOMI, PERRL, sclera and conjunctiva non-erythematous Ears:  Canals clear bilaterally.  TMs pearly gray bilaterally without erythema or bulging.   Nose:  Nasal turbinates grossly enlarged bilaterally. Some exudates noted. Tender to palpation of ethmoidal sinuses Mouth: Mucosa membranes moist. Tonsils +2, nonenlarged, non-erythematous. Neck: No cervical lymphadenopathy noted Heart:  RRR, no murmurs auscultated. Pulm:  Clear to auscultation bilaterally with good air movement.  No wheezes or rales noted.    Assessment and Plan:  1.  Sinusitis: - secondary to recent URI - no red flags.  - treat with amoxicillin.  FU if no improvement.

## 2016-09-13 ENCOUNTER — Telehealth: Payer: Self-pay | Admitting: Family Medicine

## 2016-09-13 DIAGNOSIS — I10 Essential (primary) hypertension: Secondary | ICD-10-CM

## 2016-09-13 MED ORDER — CARVEDILOL 6.25 MG PO TABS: ORAL_TABLET | ORAL | 1 refills | Status: DC

## 2016-09-13 MED ORDER — AMLODIPINE BESYLATE 10 MG PO TABS: 10.0000 mg | ORAL_TABLET | Freq: Every day | ORAL | 1 refills | Status: DC

## 2016-09-13 MED ORDER — HYDROCHLOROTHIAZIDE 25 MG PO TABS: 25.0000 mg | ORAL_TABLET | Freq: Every day | ORAL | 1 refills | Status: DC

## 2016-09-13 MED ORDER — CLONIDINE HCL 0.1 MG PO TABS: 0.1000 mg | ORAL_TABLET | Freq: Three times a day (TID) | ORAL | 1 refills | Status: DC

## 2016-09-13 NOTE — Telephone Encounter (Signed)
Pt called saying she switched to [facility] on [address]. They will not refill her blood pressure medication because they said they are waiting on authorization from [place]. Please advise.

## 2016-09-14 ENCOUNTER — Telehealth: Payer: Self-pay | Admitting: Family Medicine

## 2016-09-14 DIAGNOSIS — I10 Essential (primary) hypertension: Secondary | ICD-10-CM

## 2016-09-14 NOTE — Telephone Encounter (Signed)
PLEASE REFERENCE CALL 09/13/16 PT CHECKING ON STATUS OF THIS MESSAGE.

## 2016-09-16 NOTE — Telephone Encounter (Signed)
Filled at [facility]

## 2016-09-18 MED ORDER — HYDROCHLOROTHIAZIDE 25 MG PO TABS: 25.0000 mg | ORAL_TABLET | Freq: Every day | ORAL | 0 refills | Status: DC

## 2016-09-18 MED ORDER — CARVEDILOL 6.25 MG PO TABS: ORAL_TABLET | ORAL | 0 refills | Status: DC

## 2016-09-18 MED ORDER — AMLODIPINE BESYLATE 10 MG PO TABS: 10.0000 mg | ORAL_TABLET | Freq: Every day | ORAL | 0 refills | Status: DC

## 2016-09-18 NOTE — Telephone Encounter (Signed)
Insurance requires 90 day rx.

## 2016-10-16 ENCOUNTER — Other Ambulatory Visit: Payer: Self-pay | Admitting: Family Medicine

## 2016-10-16 DIAGNOSIS — I10 Essential (primary) hypertension: Secondary | ICD-10-CM

## 2016-10-17 MED ORDER — CLONIDINE HCL 0.1 MG PO TABS
0.1000 mg | ORAL_TABLET | Freq: Three times a day (TID) | ORAL | 0 refills | Status: DC
Start: 2016-10-17 — End: 2017-04-26

## 2016-10-17 NOTE — Telephone Encounter (Signed)
Contacted [facility].  They said that they will fill the 270 supply.  Will not have it until 2 days.  She would like a short term fill [facility].  I have performed this today.  She runs out today.  Given 5 days.

## 2017-01-17 ENCOUNTER — Other Ambulatory Visit: Payer: Self-pay | Admitting: *Deleted

## 2017-01-17 DIAGNOSIS — I10 Essential (primary) hypertension: Secondary | ICD-10-CM

## 2017-01-17 MED ORDER — AMLODIPINE BESYLATE 10 MG PO TABS: 10.0000 mg | ORAL_TABLET | Freq: Every day | ORAL | 0 refills | Status: DC

## 2017-01-20 ENCOUNTER — Other Ambulatory Visit: Payer: Self-pay | Admitting: Family Medicine

## 2017-04-02 ENCOUNTER — Telehealth: Payer: Self-pay | Admitting: *Deleted

## 2017-04-02 NOTE — Telephone Encounter (Signed)
Patient is overdue for a follow up care at [facility] mammography.  [facility] has been unable to reach patient to reschedule.   Dr. Carlota Raspberry would like [place] to try to help get this follow up scheduled for her.   [phone] to schedule.

## 2017-04-09 ENCOUNTER — Encounter: Payer: Self-pay | Admitting: Family Medicine

## 2017-04-09 LAB — HM MAMMOGRAPHY

## 2017-04-20 ENCOUNTER — Other Ambulatory Visit: Payer: Self-pay | Admitting: Family Medicine

## 2017-04-20 DIAGNOSIS — I10 Essential (primary) hypertension: Secondary | ICD-10-CM

## 2017-04-24 ENCOUNTER — Other Ambulatory Visit: Payer: Self-pay | Admitting: Family Medicine

## 2017-04-24 DIAGNOSIS — I10 Essential (primary) hypertension: Secondary | ICD-10-CM

## 2017-04-26 ENCOUNTER — Ambulatory Visit (INDEPENDENT_AMBULATORY_CARE_PROVIDER_SITE_OTHER): Payer: Managed Care, Other (non HMO) | Admitting: Physician Assistant

## 2017-04-26 ENCOUNTER — Ambulatory Visit: Payer: Managed Care, Other (non HMO) | Admitting: Physician Assistant

## 2017-04-26 ENCOUNTER — Encounter: Payer: Self-pay | Admitting: Physician Assistant

## 2017-04-26 VITALS — BP 126/82 | HR 80 | Resp 16 | Ht 64.0 in | Wt 183.6 lb

## 2017-04-26 DIAGNOSIS — I10 Essential (primary) hypertension: Secondary | ICD-10-CM

## 2017-04-26 MED ORDER — CHLORTHALIDONE 25 MG PO TABS: 25.0000 mg | ORAL_TABLET | Freq: Two times a day (BID) | ORAL | 3 refills | Status: DC

## 2017-04-26 MED ORDER — CLONIDINE HCL 0.1 MG PO TABS: 0.1000 mg | ORAL_TABLET | Freq: Three times a day (TID) | ORAL | 3 refills | Status: DC

## 2017-04-26 NOTE — Progress Notes (Addendum)
04/27/2017 9:40 AM   DOB: 1970-07-31 / MRN: [id number]  SUBJECTIVE:  Yolanda Jennings is a 46 y.o. female presenting for BP medication refills.  Is out of her clonidine right now.  She has been out of that for about 2 days now.  Last dose of clonidine was 3 pm today because she was provided with a temporary refill by the pharmacy. Tells me that she has had worsening leg swelling and tells me this is due to the norvasc, and tells me she would like to stop this.  She does snore.  Has significant fatigue during the day. Daughter tells me that she does snore on most night of the week.   She is allergic to ace inhibitors and lisinopril.   She  has a past medical history of Allergy; Hypertension; Sickle cell trait ([place]); and Tobacco abuse.    She  reports that she quit smoking about 3 years ago. She quit after 20.00 years of use. She has never used smokeless tobacco. She reports that she drinks alcohol. She reports that she does not use drugs. She  reports that she currently engages in sexual activity. The patient  has a past surgical history that includes Tubal ligation and Breast biopsy (Left, 2011).  Her family history includes Alcohol abuse in her father; Cancer in her mother; Hypertension in her brother and mother.  Review of Systems  Constitutional: Negative for chills, diaphoresis and fever.  Respiratory: Negative for cough, hemoptysis, sputum production, shortness of breath and wheezing.   Cardiovascular: Positive for leg swelling. Negative for chest pain and orthopnea.  Gastrointestinal: Negative for nausea.  Skin: Negative for rash.  Neurological: Negative for dizziness.    The problem list and medications were reviewed and updated by myself where necessary and exist elsewhere in the encounter.   OBJECTIVE:  BP 126/82   Pulse 80   Resp 16   Ht 5\' 4"  (1.626 m)   Wt 183 lb 9.6 oz (83.3 kg)   SpO2 96%   BMI 31.51 kg/m   Physical Exam  Constitutional: She is active.   Non-toxic appearance.  Cardiovascular: Normal rate, regular rhythm, S1 normal, S2 normal, normal heart sounds and intact distal pulses.  Exam reveals no gallop, no friction rub and no decreased pulses.   No murmur heard. Pulmonary/Chest: Effort normal. No stridor. No tachypnea. No respiratory distress. She has no wheezes. She has no rales.  Abdominal: She exhibits no distension.  Musculoskeletal: She exhibits edema (1+ bilaterally).  Neurological: She is alert.  Skin: Skin is warm and dry. She is not diaphoretic. No pallor.    BP Readings from Last 3 Encounters:  04/26/17 126/82  09/01/16 120/74  08/28/16 135/82   Lab Results  Component Value Date   CREATININE 1.24 (H) 04/26/2017   BUN 13 04/26/2017   NA 141 04/26/2017   K 3.7 04/26/2017   CL 100 04/26/2017   CO2 21 04/26/2017   Lab Results  Component Value Date   WBC 5.8 02/05/2016   HGB 13.9 02/05/2016   HCT 40.1 02/05/2016   MCV 76.8 (L) 02/05/2016   PLT 372 02/05/2016   Lab Results  Component Value Date   TSH 0.572 08/22/2014      Results for orders placed or performed in visit on 04/26/17 (from the past 72 hour(s))  Basic Metabolic Panel     Status: Abnormal   Collection Time: 04/26/17  6:09 PM  Result Value Ref Range   Glucose 108 (H) 65 -  99 mg/dL   BUN 13 6 - 24 mg/dL   Creatinine, Ser 1.24 (H) 0.57 - 1.00 mg/dL   GFR calc non Af Amer 52 (L) >59 mL/min/1.73   GFR calc Af Amer 60 >59 mL/min/1.73   BUN/Creatinine Ratio 10 9 - 23   Sodium 141 134 - 144 mmol/L   Potassium 3.7 3.5 - 5.2 mmol/L   Chloride 100 96 - 106 mmol/L   CO2 21 20 - 29 mmol/L   Calcium 9.9 8.7 - 10.2 mg/dL    No results found.  ASSESSMENT AND PLAN:  Aolani was seen today for medication refill.  Diagnoses and all orders for this visit:  Essential hypertension: She has an allergy to ACE and this required hospitalization.  Moving her to hygroton given improved effectives in BP control. Will plan to reduce the dose to once daily  once swelling is down. Continuing the norvasc.  She may benefit from going to atenolol for ease of use as both will have some BP reduction and atenolol is dosed once daily and would be more cost effective.  Will hold on that for now.  Screening a sleep study given difficult to control BP, snoring, and early morning awakenings.  -     chlorthalidone (HYGROTON) 25 MG tablet; Take 1 tablet (25 mg total) by mouth 2 (two) times daily. -     Basic Metabolic Panel -     cloNIDine (CATAPRES) 0.1 MG tablet; Take 1 tablet (0.1 mg total) by mouth 3 (three) times daily. Hold unless pressure is greater than 160/100. -     Ambulatory referral to Sleep Studies    The patient is advised to call or return to clinic if she does not see an improvement in symptoms, or to seek the care of the closest emergency department if she worsens with the above plan.   Philis Fendt, MHS, PA-C Primary Care at [facility] 04/27/2017 9:40 AM

## 2017-04-26 NOTE — Patient Instructions (Addendum)
  Hold the clonidine unless you see blood pressures greater than 160/100.   Continue the amlodipine.  Continue the Carvedilol. Start the new fluid pill, one twice a day.     IF you received an x-ray today, you will receive an invoice from [facility]. Please contact [facility] at [phone] with questions or concerns regarding your invoice.   IF you received labwork today, you will receive an invoice from [place]. Please contact [facility] at [phone] with questions or concerns regarding your invoice.     Our billing staff will not be able to assist you with questions regarding bills from these companies.  You will be contacted with the lab results as soon as they are available. The fastest way to get your results is to activate your My Chart account. Instructions are located on the last page of this paperwork. If you have not heard from [place] regarding the results in 2 weeks, please contact this office.

## 2017-04-27 LAB — BASIC METABOLIC PANEL
BUN / CREAT RATIO: 10 (ref 9–23)
BUN: 13 mg/dL (ref 6–24)
CHLORIDE: 100 mmol/L (ref 96–106)
CO2: 21 mmol/L (ref 20–29)
Calcium: 9.9 mg/dL (ref 8.7–10.2)
Creatinine, Ser: 1.24 mg/dL — ABNORMAL HIGH (ref 0.57–1.00)
GFR calc non Af Amer: 52 mL/min/{1.73_m2} — ABNORMAL LOW (ref >59)
GFR, EST AFRICAN AMERICAN: 60 mL/min/{1.73_m2} (ref >59)
Glucose: 108 mg/dL — ABNORMAL HIGH (ref 65–99)
Potassium: 3.7 mmol/L (ref 3.5–5.2)
Sodium: 141 mmol/L (ref 134–144)

## 2017-04-28 ENCOUNTER — Encounter: Payer: Self-pay | Admitting: Physician Assistant

## 2017-04-28 ENCOUNTER — Telehealth: Payer: Self-pay | Admitting: Physician Assistant

## 2017-04-28 NOTE — Telephone Encounter (Signed)
Called patient the check on BP med changes.  She is following the plan closely.  Higest noted BP was yesterday evening with a pressure of 138/84.  She took a clonidine.  Advised this was not necessary and to only take if pressure greater than 160/100.  Will check a BMET on Monday lab only given she is taking chlorthalidone bid at this time. Philis Fendt, MS, PA-C 10:22 AM, 04/28/2017

## 2017-04-29 ENCOUNTER — Other Ambulatory Visit: Payer: Self-pay | Admitting: Physician Assistant

## 2017-04-29 DIAGNOSIS — I10 Essential (primary) hypertension: Secondary | ICD-10-CM

## 2017-05-23 ENCOUNTER — Other Ambulatory Visit: Payer: Self-pay | Admitting: Family Medicine

## 2017-05-24 ENCOUNTER — Telehealth: Payer: Self-pay | Admitting: Family Medicine

## 2017-05-24 NOTE — Telephone Encounter (Signed)
See attached

## 2017-05-24 NOTE — Telephone Encounter (Signed)
Spoke to pt about request for refill of HCTZ. She understood she was to continue the HCTZ and start on the Hygroton. She understood she was to discontinue the amlodipine because that was the cause of her swelling --  but the AVS states she is to continue the Amlodipine,  Continue the cardedilol and start the "fluid" Hygroton.  Pt on MyChart and will print out the AVS instructions.  Please review and advise pt. About the Amlodipine.

## 2017-05-24 NOTE — Telephone Encounter (Signed)
Copied from [place] [id number]. Topic: General - Other >> May 24, 2017  8:45 AM Yvette Rack wrote: Reason for CRM: patient calling stating that she is out of HCTZ 25mg  I don't see this on her medicine list she states that Philis Fendt put her on Chlorthalidone 25mg  she would like for the medicine to go to the [facility]  >> May 24, 2017  8:48 AM Yvette Rack wrote: Med refill on HCTZ 25mg  I don't see this in her medicine list

## 2017-05-24 NOTE — Telephone Encounter (Signed)
Negative. I stopped the HCTZ in favor of chlorthalidone. She should continue on Norvasc. I also changed the hydralizine to reflect an as needed . This was reflected on her AVS. She is due back for labs, orders only, and sleep medicine has been trying to contact her. Please call her and discuss. I will call if I need to.

## 2017-05-25 ENCOUNTER — Encounter: Payer: Self-pay | Admitting: Physician Assistant

## 2017-06-10 ENCOUNTER — Other Ambulatory Visit: Payer: Self-pay

## 2017-06-10 ENCOUNTER — Encounter: Payer: Self-pay | Admitting: Physician Assistant

## 2017-06-10 ENCOUNTER — Ambulatory Visit: Payer: Managed Care, Other (non HMO) | Admitting: Physician Assistant

## 2017-06-10 VITALS — BP 112/78 | HR 80 | Resp 16 | Ht 64.0 in | Wt 184.8 lb

## 2017-06-10 DIAGNOSIS — I10 Essential (primary) hypertension: Secondary | ICD-10-CM

## 2017-06-10 NOTE — Progress Notes (Signed)
06/10/2017 5:40 PM   DOB: September 05, 1970 / MRN: [id number]  SUBJECTIVE:  Yolanda Jennings is a 46 y.o. female presenting for recheck of BP. Tells me the pressure has been doing well, as long as she continues to take clonidine. It is hard to pin down ambulatory pressures in my interview with her today.  She has not been for her renal artery stenosis [place].  Tells me that the swelling is not better. She wants to come off of the amlodipine.  She has not gotten in touch with sleep medicine to rule out apnea as a source of her HTN.    She is allergic to ace inhibitors and lisinopril.   She  has a past medical history of Allergy, Hypertension, Sickle cell trait ([place]), and Tobacco abuse.    She  reports that she quit smoking about 3 years ago. She quit after 20.00 years of use. she has never used smokeless tobacco. She reports that she drinks alcohol. She reports that she does not use drugs. She  reports that she currently engages in sexual activity. The patient  has a past surgical history that includes Tubal ligation and Breast biopsy (Left, 2011).  Her family history includes Alcohol abuse in her father; Cancer in her mother; Hypertension in her brother and mother.  Review of Systems  Constitutional: Negative for chills, diaphoresis and fever.  Eyes: Negative.   Respiratory: Negative for cough, hemoptysis, sputum production, shortness of breath and wheezing.   Cardiovascular: Negative for chest pain, orthopnea and leg swelling.  Gastrointestinal: Negative for nausea.  Skin: Negative for rash.  Neurological: Negative for dizziness, sensory change, speech change, focal weakness and headaches.    The problem list and medications were reviewed and updated by myself where necessary and exist elsewhere in the encounter.   OBJECTIVE:  BP 112/78 (BP Location: Right Arm, Patient Position: Sitting, Cuff Size: Normal)   Pulse 80   Resp 16   Ht 5' 4" (1.626 m)   Wt 184 lb 12.8 oz (83.8 kg)   LMP  06/03/2017   SpO2 99%   BMI 31.72 kg/m   BP Readings from Last 3 Encounters:  06/10/17 112/78  04/26/17 126/82  09/01/16 120/74   Physical Exam  Constitutional: She is active.  Non-toxic appearance.  Cardiovascular: Normal rate, regular rhythm, S1 normal, S2 normal, normal heart sounds and intact distal pulses. Exam reveals no gallop, no friction rub and no decreased pulses.  No murmur heard. Pulmonary/Chest: Effort normal. No stridor. No tachypnea. No respiratory distress. She has no wheezes. She has no rales.  Abdominal: She exhibits no distension.  Musculoskeletal: She exhibits no edema.  Neurological: She is alert.  Skin: Skin is warm and dry. She is not diaphoretic. No pallor.   Lab Results  Component Value Date   CREATININE 1.17 (H) 06/10/2017   BUN 15 06/10/2017   NA 143 06/10/2017   K 3.6 06/10/2017   CL 104 06/10/2017   CO2 20 06/10/2017     No results found for this or any previous visit (from the past 72 hour(s)).  No results found.  ASSESSMENT AND PLAN:  [name] was seen today for follow-up.  Diagnoses and all orders for this visit:  Hypertension, unspecified type: Pressure is better today.  Metabolic panel improved as well. She is not reliable and continues to take clonidine.  I will not stop her norvasc in favor of increase dosing or frequency of clonidine. Given her improvement in labs and improved pressure will  maintain the current regimen and continue to encourage her to only take clonidine if pressures are greater than what is indicated on the sig. I encouraged her to call sleep medicine and follow up with the referral.  -     [id number]    The patient is advised to call or return to clinic if she does not see an improvement in symptoms, or to seek the care of the closest emergency department if she worsens with the above plan.   Philis Fendt, MHS, PA-C Primary Care at [facility] 06/10/2017 5:40 PM

## 2017-06-10 NOTE — Patient Instructions (Signed)
     IF you received an x-ray today, you will receive an invoice from [facility]. Please contact  Radiology at [phone] with questions or concerns regarding your invoice.   IF you received labwork today, you will receive an invoice from [facility]. Please contact [facility] at [phone] with questions or concerns regarding your invoice.   Our billing staff will not be able to assist you with questions regarding bills from these companies.  You will be contacted with the lab results as soon as they are available. The fastest way to get your results is to activate your My Chart account. Instructions are located on the last page of this paperwork. If you have not heard from us regarding the results in 2 weeks, please contact this office.     

## 2017-06-11 LAB — CMP14+EGFR
ALK PHOS: 144 IU/L — AB (ref 39–117)
ALT: 9 IU/L (ref 0–32)
AST: 21 IU/L (ref 0–40)
Albumin/Globulin Ratio: 1.1 — ABNORMAL LOW (ref 1.2–2.2)
Albumin: 4.1 g/dL (ref 3.5–5.5)
BUN/Creatinine Ratio: 13 (ref 9–23)
BUN: 15 mg/dL (ref 6–24)
Bilirubin Total: 0.2 mg/dL (ref 0.0–1.2)
CALCIUM: 9.5 mg/dL (ref 8.7–10.2)
CO2: 20 mmol/L (ref 20–29)
CREATININE: 1.17 mg/dL — AB (ref 0.57–1.00)
Chloride: 104 mmol/L (ref 96–106)
GFR calc Af Amer: 64 mL/min/{1.73_m2} (ref >59)
GFR, EST NON AFRICAN AMERICAN: 56 mL/min/{1.73_m2} — AB (ref >59)
GLOBULIN, TOTAL: 3.7 g/dL (ref 1.5–4.5)
GLUCOSE: 125 mg/dL — AB (ref 65–99)
Potassium: 3.6 mmol/L (ref 3.5–5.2)
SODIUM: 143 mmol/L (ref 134–144)
Total Protein: 7.8 g/dL (ref 6.0–8.5)

## 2017-06-26 ENCOUNTER — Other Ambulatory Visit: Payer: Self-pay | Admitting: Family Medicine

## 2017-06-26 DIAGNOSIS — I10 Essential (primary) hypertension: Secondary | ICD-10-CM

## 2017-06-28 ENCOUNTER — Telehealth: Payer: Self-pay | Admitting: Family Medicine

## 2017-06-28 ENCOUNTER — Other Ambulatory Visit: Payer: Self-pay | Admitting: Family Medicine

## 2017-06-28 ENCOUNTER — Encounter: Payer: Self-pay | Admitting: Family Medicine

## 2017-06-28 NOTE — Telephone Encounter (Signed)
Copied from [place]. Topic: Quick Communication - Rx Refill/Question >> Jun 28, 2017  5:23 PM Cecelia Byars, NT wrote: Has the patient contacted their pharmacy? yes  (Agent: If no, request that the patient contact the pharmacy for the refill. Preferred Pharmacy (with phone number or street name):[facility] [address]  Agent: Please be advised that RX refills may take up to 3 business days. We ask that you follow-up with your pharmacy. Need refill for carvedilol she says it was denied and she was recently at the practice

## 2017-06-30 ENCOUNTER — Emergency Department (HOSPITAL_COMMUNITY)
Admission: EM | Admit: 2017-06-30 | Discharge: 2017-07-01 | Disposition: A | Discharge status: Home / Self Care | Payer: Managed Care, Other (non HMO) | Attending: Emergency Medicine | Admitting: Emergency Medicine

## 2017-06-30 DIAGNOSIS — I10 Essential (primary) hypertension: Secondary | ICD-10-CM | POA: Insufficient documentation

## 2017-06-30 DIAGNOSIS — R002 Palpitations: Secondary | ICD-10-CM | POA: Insufficient documentation

## 2017-06-30 DIAGNOSIS — R000 Tachycardia, unspecified: Secondary | ICD-10-CM | POA: Insufficient documentation

## 2017-06-30 DIAGNOSIS — Z79899 Other long term (current) drug therapy: Secondary | ICD-10-CM | POA: Insufficient documentation

## 2017-06-30 DIAGNOSIS — Z87891 Personal history of nicotine dependence: Secondary | ICD-10-CM | POA: Diagnosis not present

## 2017-06-30 DIAGNOSIS — R011 Cardiac murmur, unspecified: Secondary | ICD-10-CM | POA: Diagnosis not present

## 2017-06-30 DIAGNOSIS — E876 Hypokalemia: Secondary | ICD-10-CM | POA: Diagnosis not present

## 2017-06-30 DIAGNOSIS — D573 Sickle-cell trait: Secondary | ICD-10-CM | POA: Diagnosis not present

## 2017-07-01 ENCOUNTER — Emergency Department (HOSPITAL_COMMUNITY): Payer: Managed Care, Other (non HMO)

## 2017-07-01 ENCOUNTER — Encounter (HOSPITAL_COMMUNITY): Payer: Self-pay | Admitting: Emergency Medicine

## 2017-07-01 LAB — BASIC METABOLIC PANEL
Anion gap: 12 (ref 5–15)
BUN: 7 mg/dL (ref 6–20)
CHLORIDE: 100 mmol/L — AB (ref 101–111)
CO2: 22 mmol/L (ref 22–32)
CREATININE: 1.01 mg/dL — AB (ref 0.44–1.00)
Calcium: 8.7 mg/dL — ABNORMAL LOW (ref 8.9–10.3)
GFR calc Af Amer: >60 mL/min (ref >60)
GFR calc non Af Amer: >60 mL/min (ref >60)
Glucose, Bld: 113 mg/dL — ABNORMAL HIGH (ref 65–99)
Potassium: 2.4 mmol/L — CL (ref 3.5–5.1)
SODIUM: 134 mmol/L — AB (ref 135–145)

## 2017-07-01 LAB — I-STAT BETA HCG BLOOD, ED (MC, WL, AP ONLY): I-stat hCG, quantitative: <5 m[IU]/mL (ref <5)

## 2017-07-01 LAB — I-STAT TROPONIN, ED: Troponin i, poc: 0 ng/mL (ref 0.00–0.08)

## 2017-07-01 LAB — CBC
HCT: 38 % (ref 36.0–46.0)
Hemoglobin: 13.2 g/dL (ref 12.0–15.0)
MCH: 27.6 pg (ref 26.0–34.0)
MCHC: 34.7 g/dL (ref 30.0–36.0)
MCV: 79.3 fL (ref 78.0–100.0)
PLATELETS: 361 10*3/uL (ref 150–400)
RBC: 4.79 MIL/uL (ref 3.87–5.11)
RDW: 14.5 % (ref 11.5–15.5)
WBC: 6.6 10*3/uL (ref 4.0–10.5)

## 2017-07-01 LAB — D-DIMER, QUANTITATIVE: D-Dimer, Quant: 0.6 ug/mL-FEU — ABNORMAL HIGH (ref 0.00–0.50)

## 2017-07-01 MED ORDER — IOPAMIDOL (ISOVUE-370) INJECTION 76%
INTRAVENOUS | Status: AC
  Filled 2017-07-01: qty 100

## 2017-07-01 MED ORDER — POTASSIUM CHLORIDE CRYS ER 20 MEQ PO TBCR: 40.0000 meq | EXTENDED_RELEASE_TABLET | Freq: Every day | ORAL | 0 refills | Status: DC

## 2017-07-01 MED ORDER — POTASSIUM CHLORIDE CRYS ER 20 MEQ PO TBCR
40.0000 meq | EXTENDED_RELEASE_TABLET | Freq: Once | ORAL | Status: AC
  Administered 2017-07-01: 40 meq via ORAL
  Filled 2017-07-01: qty 2

## 2017-07-01 MED ORDER — POTASSIUM CHLORIDE 10 MEQ/100ML IV SOLN
10.0000 meq | INTRAVENOUS | Status: AC
  Administered 2017-07-01 (×2): 10 meq via INTRAVENOUS
  Filled 2017-07-01 (×2): qty 100

## 2017-07-01 MED ORDER — IOPAMIDOL (ISOVUE-370) INJECTION 76%
INTRAVENOUS | Status: AC
  Administered 2017-07-01: 50 mL
  Filled 2017-07-01: qty 100

## 2017-07-01 NOTE — Telephone Encounter (Signed)
Does Legrand Como want her to continue the Carvedilol along with her other BP medications?  Wasn't sure from the office notes.   If so can he renew this Rx?  THanks.

## 2017-07-01 NOTE — ED Provider Notes (Signed)
[facility] EMERGENCY DEPARTMENT Provider Note   CSN: [id number] Arrival date & time: 06/30/17  2355     History   Chief Complaint Chief Complaint  Patient presents with  . Palpitations    HPI Yolanda Jennings is a 46 y.o. female.  HPI  This is a 46 year old female with a history and sickle cell trait who presents with palpitations.  Patient reports over the last 24 hours she has had increasing and worsening palpitations.  She reports a history of the same but states previously it has been self-limited.  She states today it got worse.  She denies any increased caffeine use.  She denies any chest pain, shortness of breath, leg swelling.  Denies any recent medication changes or supplements.  She states that she feels like her heart is beating fast and every third beat is more prominent.  He has never had a cardiac workup.  Past Medical History:  Diagnosis Date  . Allergy   . Hypertension   . Sickle cell trait ([place])   . Tobacco abuse    Stopped smoking 08/24/13    Patient Active Problem List   Diagnosis Date Noted  . Obesity (BMI 30.0-34.9) 08/22/2014  . Microcytic anemia 09/03/2013  . Headache 09/03/2013  . HTN (hypertension) 08/28/2013    Past Surgical History:  Procedure Laterality Date  . BREAST BIOPSY Left 2011  . HERNIA REPAIR    . TUBAL LIGATION     2003    OB History    Gravida Para Term Preterm AB Living   2 2 2     2    SAB TAB Ectopic Multiple Live Births           2       Home Medications    Prior to Admission medications   Medication Sig Start Date End Date Taking? Authorizing Provider  amLODipine (NORVASC) 10 MG tablet Take 1 tablet (10 mg total) by mouth daily. Office visit needed for additional refills 01/17/17   [name], MD  carvedilol (COREG) 6.25 MG tablet TAKE 1 TABLET BY MOUTH TWICE DAILY WITH A MEAL 09/18/16   [name], MD  carvedilol (COREG) 6.25 MG tablet TAKE 1 TABLET BY MOUTH TWICE DAILY WITH  MEALS 06/27/17   [name], MD  cloNIDine (CATAPRES) 0.1 MG tablet Take 1 tablet (0.1 mg total) by mouth 3 (three) times daily. Hold unless pressure is greater than 160/100. 04/26/17   Tereasa Coop, PA-C  ibuprofen (ADVIL,MOTRIN) 200 MG tablet Take 600 mg by mouth every 4 (four) hours as needed for headache, mild pain or moderate pain.     [provider]  potassium chloride SA (K-DUR,KLOR-CON) 20 MEQ tablet Take 2 tablets (40 mEq total) by mouth daily. 07/01/17   Horton, Barbette Hair, MD    Family History Family History  Problem Relation Age of Onset  . Cancer Mother        lung & cervical  . Hypertension Mother   . Alcohol abuse Father   . Hypertension Brother     Social History Social History   Tobacco Use  . Smoking status: Former Smoker    Years: 20.00    Last attempt to quit: 08/24/2013    Years since quitting: 3.8  . Smokeless tobacco: Never Used  Substance Use Topics  . Alcohol use: Yes    Alcohol/week: 0.0 oz    Comment: occasional  . Drug use: No     Allergies  Ace inhibitors and Lisinopril   Review of Systems Review of Systems  Constitutional: Negative for fever.  Respiratory: Negative for shortness of breath.   Cardiovascular: Positive for palpitations. Negative for chest pain and leg swelling.  Gastrointestinal: Negative for nausea and vomiting.  All other systems reviewed and are negative.    Physical Exam Updated Vital Signs BP 116/76   Pulse 74   Temp 97.9 F (36.6 C) (Oral)   Resp (!) 23   Ht 5\' 4"  (1.626 m)   Wt 82.6 kg (182 lb)   LMP 06/24/2017 (Approximate)   SpO2 97%   BMI 31.24 kg/m   Physical Exam  Constitutional: She is oriented to person, place, and time. She appears well-developed and well-nourished. No distress.  HENT:  Head: Normocephalic and atraumatic.  Cardiovascular: Normal rate and regular rhythm.  Murmur heard. Pulmonary/Chest: Effort normal. No respiratory distress. She has no wheezes.    Abdominal: Soft. Bowel sounds are normal. There is no tenderness.  Neurological: She is alert and oriented to person, place, and time.  Skin: Skin is warm and dry.  Psychiatric: She has a normal mood and affect.  Nursing note and vitals reviewed.    ED Treatments / Results  Labs (all labs ordered are listed, but only abnormal results are displayed) Labs Reviewed  BASIC METABOLIC PANEL - Abnormal; Notable for the following components:      Result Value   Sodium 134 (*)    Potassium 2.4 (*)    Chloride 100 (*)    Glucose, Bld 113 (*)    Creatinine, Ser 1.01 (*)    Calcium 8.7 (*)    All other components within normal limits  D-DIMER, QUANTITATIVE (NOT AT [facility]) - Abnormal; Notable for the following components:   D-Dimer, Quant 0.60 (*)    All other components within normal limits  CBC  I-STAT TROPONIN, ED  I-STAT BETA HCG BLOOD, ED ([facility], [facility], [facility] ONLY)    EKG  EKG Interpretation  Date/Time:  Monday July 01 2017 00:06:49 EST Ventricular Rate:  92 PR Interval:    QRS Duration: 79 QT Interval:  350 QTC Calculation: 433 R Axis:   36 Text Interpretation:  Sinus tachycardia Atrial premature complexes Probable left atrial enlargement Anteroseptal infarct, age indeterminate Borderline repolarization abnormality Confirmed by Thayer Jew [id number]) on 07/01/2017 12:10:31 AM       Radiology Dg Chest 2 View  Result Date: 07/01/2017 CLINICAL DATA:  Initial evaluation for palpitations, shortness of breath. EXAM: CHEST  2 VIEW COMPARISON:  Prior radiograph from 02/05/2016. FINDINGS: The cardiac and mediastinal silhouettes are stable in size and contour, and remain within normal limits. The lungs are normally inflated. No airspace consolidation, pleural effusion, or pulmonary edema is identified. There is no pneumothorax. No acute osseous abnormality identified. IMPRESSION: No active cardiopulmonary disease. Electronically Signed   By: Jeannine Boga M.D.   On: 07/01/2017  00:39   Ct Angio Chest Pe W And/or Wo Contrast  Result Date: 07/01/2017 CLINICAL DATA:  Acute onset of palpitations.  Elevated D-dimer. EXAM: CT ANGIOGRAPHY CHEST WITH CONTRAST TECHNIQUE: Multidetector CT imaging of the chest was performed using the standard protocol during bolus administration of intravenous contrast. Multiplanar CT image reconstructions and MIPs were obtained to evaluate the vascular anatomy. CONTRAST:  29mL ISOVUE-370 IOPAMIDOL (ISOVUE-370) INJECTION 76% COMPARISON:  Chest radiograph performed earlier today at 12:30 a.m. FINDINGS: Cardiovascular:  There is no evidence of pulmonary embolus. The heart is normal in size. The thoracic aorta is unremarkable. The great  vessels are within normal limits. Mediastinum/Nodes: The mediastinum is unremarkable in appearance. No mediastinal lymphadenopathy is seen. No pericardial effusion is identified. The visualized portions of the thyroid gland are unremarkable. No axillary lymphadenopathy is seen. Lungs/Pleura: The lungs are clear bilaterally. No focal consolidation, pleural effusion or pneumothorax is seen. No masses are identified. Upper Abdomen: A 1.7 cm hypodensity is noted at the central right hepatic lobe. This is only minimally increased in size from 2015, and is likely benign. The visualized portions of the liver and spleen are otherwise unremarkable. The visualized portions of the pancreas, adrenal glands and kidneys are within normal limits. Musculoskeletal: No acute osseous abnormalities are identified. The visualized musculature is unremarkable in appearance. Review of the MIP images confirms the above findings. IMPRESSION: 1. No evidence of pulmonary embolus. 2. Lungs clear bilaterally. 3. 1.7 cm hypodensity at the central right hepatic lobe is only minimally increased in size from 2015, and likely reflects a benign cyst. Electronically Signed   By: Garald Balding M.D.   On: 07/01/2017 03:24    Procedures Procedures (including critical  care time)  Medications Ordered in ED Medications  iopamidol (ISOVUE-370) 76 % injection (not administered)  potassium chloride 10 mEq in 100 mL IVPB (10 mEq Intravenous New Bag/Given 07/01/17 0358)  potassium chloride SA (K-DUR,KLOR-CON) CR tablet 40 mEq (40 mEq Oral Given 07/01/17 0151)  iopamidol (ISOVUE-370) 76 % injection (50 mLs  Contrast Given 07/01/17 0300)     Initial Impression / Assessment and Plan / ED Course  I have reviewed the triage vital signs and the nursing notes.  Pertinent labs & imaging results that were available during my care of the patient were reviewed by me and considered in my medical decision making (see chart for details).     Patient presents with palpitations.  She is overall nontoxic appearing and vital signs are reassuring.  No evidence of arrhythmia on EKG.  She does have episodes of tachycardia into the 110s.  Lab work notable for potassium of 2.4.  This was replaced both IV and orally.  D-dimer was sent to given tachycardia.  Mildly elevated at 0.6.  CT scan shows no evidence of PE.  Suspect palpitations may be related to hypokalemia and metabolic derangement.  However, will have patient follow-up with cardiology.  In the meantime she was instructed to avoid caffeine or any other triggers.  Follow-up with PCP in 1 week for potassium recheck.  After history, exam, and medical workup I feel the patient has been appropriately medically screened and is safe for discharge home. Pertinent diagnoses were discussed with the patient. Patient was given return precautions.   Final Clinical Impressions(s) / ED Diagnoses   Final diagnoses:  Palpitations  Hypokalemia    ED Discharge Orders        Ordered    potassium chloride SA (K-DUR,KLOR-CON) 20 MEQ tablet  Daily     07/01/17 0522       Merryl Hacker, MD 07/01/17 [phone]

## 2017-07-01 NOTE — Telephone Encounter (Signed)
BP well controlled.  Labs look great. She should continue her current medications. No changes. Philis Fendt, MS, PA-C 11:28 AM, 07/01/2017

## 2017-07-01 NOTE — Discharge Instructions (Signed)
You were seen today for palpitations.  You were noted to have low potassium.  This can sometimes contribute to her palpitations.  Your EKG is reassuring.  Follow-up with your primary physician in 1 week for potassium recheck.  Also follow-up with cardiology if you continue to have symptoms of palpitations for further evaluation.

## 2017-07-01 NOTE — ED Triage Notes (Signed)
Patient arrives from home with complaint of heart palpitations which have been present all day. States onset while at rest with gradually increasing intensity throughout the day. Patient also reports low blood pressures at home with readings @ 100/80; states SBP baseline for her is 125. States history of similar, but it was much more mild and resolved on its own.

## 2017-07-01 NOTE — ED Notes (Signed)
ED Provider at bedside. 

## 2017-07-01 NOTE — ED Notes (Signed)
Patient transported to CT 

## 2017-08-01 ENCOUNTER — Other Ambulatory Visit: Payer: Self-pay | Admitting: Family Medicine

## 2017-08-01 DIAGNOSIS — I10 Essential (primary) hypertension: Secondary | ICD-10-CM

## 2017-09-02 ENCOUNTER — Other Ambulatory Visit: Payer: Self-pay | Admitting: Physician Assistant

## 2017-09-02 DIAGNOSIS — I10 Essential (primary) hypertension: Secondary | ICD-10-CM

## 2017-10-01 ENCOUNTER — Other Ambulatory Visit: Payer: Self-pay | Admitting: Family Medicine

## 2017-10-01 DIAGNOSIS — I10 Essential (primary) hypertension: Secondary | ICD-10-CM

## 2017-10-01 NOTE — Telephone Encounter (Signed)
lov-06/10/17  Nov-none scheduled Last refilled-09/03/17 Qty #90 0RF  Please Advise

## 2017-10-02 NOTE — Telephone Encounter (Signed)
Seen by Philis Fendt in December.  Will refill clonidine for now.

## 2017-11-01 ENCOUNTER — Other Ambulatory Visit: Payer: Self-pay | Admitting: Family Medicine

## 2017-11-01 DIAGNOSIS — I10 Essential (primary) hypertension: Secondary | ICD-10-CM

## 2017-11-01 NOTE — Telephone Encounter (Signed)
Clonidine 0.1 mg refill request  LOV 06/10/17 with Dr. Carlota Raspberry      No follow up scheduled  Last refill:  10/02/17  #90  [place], [place]

## 2017-11-02 ENCOUNTER — Other Ambulatory Visit: Payer: Self-pay | Admitting: Family Medicine

## 2017-11-02 DIAGNOSIS — I10 Essential (primary) hypertension: Secondary | ICD-10-CM

## 2017-11-02 MED ORDER — CLONIDINE HCL 0.1 MG PO TABS: ORAL_TABLET | ORAL | 0 refills | Status: DC

## 2017-11-02 NOTE — Telephone Encounter (Signed)
Incoming call from patient. She has appointment scheduled for Wednesday 11/06/2017 to follow up on blood pressure. 5 day supply of clonidine sent to pharmacy per patient request.

## 2017-11-06 ENCOUNTER — Other Ambulatory Visit: Payer: Self-pay

## 2017-11-06 ENCOUNTER — Encounter: Payer: Self-pay | Admitting: Physician Assistant

## 2017-11-06 ENCOUNTER — Ambulatory Visit (INDEPENDENT_AMBULATORY_CARE_PROVIDER_SITE_OTHER): Payer: Managed Care, Other (non HMO) | Admitting: Physician Assistant

## 2017-11-06 VITALS — BP 134/86 | HR 76 | Temp 98.3°F | Resp 16 | Ht 65.0 in | Wt 187.0 lb

## 2017-11-06 DIAGNOSIS — I10 Essential (primary) hypertension: Secondary | ICD-10-CM

## 2017-11-06 MED ORDER — CLONIDINE HCL 0.1 MG PO TABS: 0.1000 mg | ORAL_TABLET | Freq: Three times a day (TID) | ORAL | 2 refills | Status: DC

## 2017-11-06 MED ORDER — POTASSIUM CHLORIDE CRYS ER 20 MEQ PO TBCR: 40.0000 meq | EXTENDED_RELEASE_TABLET | Freq: Every day | ORAL | 1 refills | Status: DC

## 2017-11-06 MED ORDER — AMLODIPINE BESYLATE 10 MG PO TABS: 10.0000 mg | ORAL_TABLET | Freq: Every day | ORAL | 1 refills | Status: DC

## 2017-11-06 MED ORDER — CARVEDILOL 6.25 MG PO TABS: ORAL_TABLET | ORAL | 1 refills | Status: DC

## 2017-11-06 MED ORDER — CHLORTHALIDONE 25 MG PO TABS
25.0000 mg | ORAL_TABLET | Freq: Every day | ORAL | 1 refills | Status: DC
Start: 2017-11-06 — End: 2018-06-24

## 2017-11-06 NOTE — Patient Instructions (Signed)
     IF you received an x-ray today, you will receive an invoice from [facility]. Please contact [facility] at [phone] with questions or concerns regarding your invoice.   IF you received labwork today, you will receive an invoice from [facility]. Please contact [facility] at [phone] with questions or concerns regarding your invoice.   Our billing staff will not be able to assist you with questions regarding bills from these companies.  You will be contacted with the lab results as soon as they are available. The fastest way to get your results is to activate your My Chart account. Instructions are located on the last page of this paperwork. If you have not heard from us regarding the results in 2 weeks, please contact this office.     

## 2017-11-06 NOTE — Progress Notes (Signed)
11/06/2017 2:12 PM   DOB: [birth year] / MRN: [id number]  SUBJECTIVE:  Yolanda Jennings is a 47 y.o. female presenting for refills of BP medication.  She is currently taking chlorthalidone 25 mg daily, Norvasc 10, Coreg bid, and clonidine TID. I have confirmed these medications TWICE with her today and she is in-fact taking them daily.  She was seen in the ED a few months ago for palpitations and was found to have a hypokalemia.  She is taking potassium supplementation today but not her K-dur.  She feels well and denies complaint.   Patient Active Problem List   Diagnosis Date Noted  . Obesity (BMI 30.0-34.9) 08/22/2014  . Microcytic anemia 09/03/2013  . Headache 09/03/2013  . HTN (hypertension) 08/28/2013     Current Outpatient Medications:  .  amLODipine (NORVASC) 10 MG tablet, Take 1 tablet (10 mg total) by mouth daily., Disp: 90 tablet, Rfl: 1 .  carvedilol (COREG) 6.25 MG tablet, TAKE 1 TABLET BY MOUTH TWICE DAILY WITH A MEAL, Disp: 180 tablet, Rfl: 1 .  cloNIDine (CATAPRES) 0.1 MG tablet, Take 1 tablet (0.1 mg total) by mouth 3 (three) times daily., Disp: 180 tablet, Rfl: 2 .  potassium chloride SA (K-DUR,KLOR-CON) 20 MEQ tablet, Take 2 tablets (40 mEq total) by mouth daily., Disp: 180 tablet, Rfl: 1 .  chlorthalidone (HYGROTON) 25 MG tablet, Take 1 tablet (25 mg total) by mouth daily., Disp: 90 tablet, Rfl: 1   She is allergic to ace inhibitors and lisinopril.   She  has a past medical history of Allergy, Hypertension, Sickle cell trait ([place]), and Tobacco abuse.    She  reports that she quit smoking about 4 years ago. She quit after 20.00 years of use. She has never used smokeless tobacco. She reports that she drinks alcohol. She reports that she does not use drugs. She  reports that she currently engages in sexual activity. The patient  has a past surgical history that includes Tubal ligation; Breast biopsy (Left, 2011); and Hernia repair.  Her family history includes Alcohol  abuse in her father; Cancer in her mother; Hypertension in her brother and mother.  Review of Systems  Constitutional: Negative for chills, diaphoresis and fever.  Eyes: Negative.   Respiratory: Negative for cough, hemoptysis, sputum production, shortness of breath and wheezing.   Cardiovascular: Negative for chest pain, orthopnea and leg swelling.  Gastrointestinal: Negative for abdominal pain, blood in stool, constipation, diarrhea, heartburn, melena, nausea and vomiting.  Genitourinary: Negative for dysuria, flank pain, frequency, hematuria and urgency.  Skin: Negative for rash.  Neurological: Negative for dizziness, sensory change, speech change, focal weakness and headaches.    The problem list and medications were reviewed and updated by myself where necessary and exist elsewhere in the encounter.   OBJECTIVE:  BP 134/86 (BP Location: Left Arm, Patient Position: Sitting, Cuff Size: Normal)   Pulse 76   Temp 98.3 F (36.8 C) (Oral)   Resp 16   Ht 5\' 5"  (1.651 m)   Wt 187 lb (84.8 kg)   LMP 11/01/2017   SpO2 100%   BMI 31.12 kg/m   Physical Exam  Constitutional: She is oriented to person, place, and time. She appears well-developed.  Eyes: Pupils are equal, round, and reactive to light. EOM are normal.  Cardiovascular: Normal rate, regular rhythm, S1 normal, S2 normal, normal heart sounds and intact distal pulses. Exam reveals no gallop, no friction rub and no decreased pulses.  No murmur heard. Pulmonary/Chest: Effort normal.  No stridor. No respiratory distress. She has no wheezes. She has no rales.  Abdominal: She exhibits no distension.  Musculoskeletal: Normal range of motion. She exhibits no edema.  Neurological: She is alert and oriented to person, place, and time. No cranial nerve deficit.  Skin: Skin is warm and dry. She is not diaphoretic. No pallor.  Psychiatric: She has a normal mood and affect.  Vitals reviewed.     No results found for this or any  previous visit (from the past 72 hour(s)).  No results found.  ASSESSMENT AND PLAN:  Yolanda Jennings was seen today for hypertension and medication refill.  Diagnoses and all orders for this visit:  Well-controlled hypertension -     Hemoglobin A1c -     Lipid panel -     Basic metabolic panel -     chlorthalidone (HYGROTON) 25 MG tablet; Take 1 tablet (25 mg total) by mouth daily. -     potassium chloride SA (K-DUR,KLOR-CON) 20 MEQ tablet; Take 2 tablets (40 mEq total) by mouth daily. -     Care order/instruction:  Essential hypertension -     cloNIDine (CATAPRES) 0.1 MG tablet; Take 1 tablet (0.1 mg total) by mouth 3 (three) times daily. -     carvedilol (COREG) 6.25 MG tablet; TAKE 1 TABLET BY MOUTH TWICE DAILY WITH A MEAL -     amLODipine (NORVASC) 10 MG tablet; Take 1 tablet (10 mg total) by mouth daily.    The patient is advised to call or return to clinic if she does not see an improvement in symptoms, or to seek the care of the closest emergency department if she worsens with the above plan.   Philis Fendt, MHS, PA-C [facility] 11/06/2017 2:12 PM

## 2017-11-07 LAB — LIPID PANEL
Chol/HDL Ratio: 3.1 ratio (ref 0.0–4.4)
Cholesterol, Total: 180 mg/dL (ref 100–199)
HDL: 59 mg/dL (ref >39)
LDL CALC: 98 mg/dL (ref 0–99)
TRIGLYCERIDES: 114 mg/dL (ref 0–149)
VLDL CHOLESTEROL CAL: 23 mg/dL (ref 5–40)

## 2017-11-07 LAB — BASIC METABOLIC PANEL
BUN/Creatinine Ratio: 10 (ref 9–23)
BUN: 11 mg/dL (ref 6–24)
CO2: 23 mmol/L (ref 20–29)
CREATININE: 1.05 mg/dL — AB (ref 0.57–1.00)
Calcium: 9.3 mg/dL (ref 8.7–10.2)
Chloride: 97 mmol/L (ref 96–106)
GFR, EST AFRICAN AMERICAN: 73 mL/min/{1.73_m2} (ref >59)
GFR, EST NON AFRICAN AMERICAN: 63 mL/min/{1.73_m2} (ref >59)
Glucose: 93 mg/dL (ref 65–99)
POTASSIUM: 3.5 mmol/L (ref 3.5–5.2)
SODIUM: 139 mmol/L (ref 134–144)

## 2017-11-07 LAB — HEMOGLOBIN A1C
Est. average glucose Bld gHb Est-mCnc: 126 mg/dL
Hgb A1c MFr Bld: 6 % — ABNORMAL HIGH (ref 4.8–5.6)

## 2017-11-08 ENCOUNTER — Encounter: Payer: Self-pay | Admitting: Radiology

## 2017-11-08 NOTE — Progress Notes (Signed)
Please relay below with phone or letter. Philis Fendt, MS, PA-C 8:19 AM, 11/08/2017

## 2017-12-04 ENCOUNTER — Encounter: Payer: Self-pay | Admitting: Physician Assistant

## 2017-12-04 ENCOUNTER — Other Ambulatory Visit: Payer: Self-pay

## 2017-12-04 ENCOUNTER — Ambulatory Visit (INDEPENDENT_AMBULATORY_CARE_PROVIDER_SITE_OTHER): Payer: Managed Care, Other (non HMO) | Admitting: Physician Assistant

## 2017-12-04 VITALS — BP 126/72 | HR 74 | Temp 98.2°F | Resp 18 | Ht 65.0 in | Wt 186.4 lb

## 2017-12-04 DIAGNOSIS — I10 Essential (primary) hypertension: Secondary | ICD-10-CM | POA: Diagnosis not present

## 2017-12-04 MED ORDER — CYCLOBENZAPRINE HCL 10 MG PO TABS: 5.0000 mg | ORAL_TABLET | Freq: Three times a day (TID) | ORAL | 0 refills | Status: DC | PRN

## 2017-12-04 NOTE — Patient Instructions (Addendum)
  See you in 6 months.    IF you received an x-ray today, you will receive an invoice from [facility]. Please contact [facility] at [phone] with questions or concerns regarding your invoice.   IF you received labwork today, you will receive an invoice from [facility]. Please contact [facility] at [phone] with questions or concerns regarding your invoice.   Our billing staff will not be able to assist you with questions regarding bills from these companies.  You will be contacted with the lab results as soon as they are available. The fastest way to get your results is to activate your My Chart account. Instructions are located on the last page of this paperwork. If you have not heard from [place] regarding the results in 2 weeks, please contact this office.

## 2017-12-04 NOTE — Progress Notes (Signed)
12/04/2017 5:02 PM   DOB: Jan 22, 1971 / MRN: [id number]  SUBJECTIVE:  Yolanda Jennings is a 47 y.o. female presenting for BP recheck. Her medications are as follows:  .  amLODipine (NORVASC) 10 MG tablet, Take 1 tablet (10 mg total) by mouth daily., Disp: 90 tablet, Rfl: 1 .  carvedilol (COREG) 6.25 MG tablet, TAKE 1 TABLET BY MOUTH TWICE DAILY WITH A MEAL, Disp: 180 tablet, Rfl: 1 .  chlorthalidone (HYGROTON) 25 MG tablet, Take 1 tablet (25 mg total) by mouth daily., Disp: 90 tablet, Rfl: 1 .  cloNIDine (CATAPRES) 0.1 MG tablet, Take 1 tablet (0.1 mg total) by mouth 3 (three) times daily., Disp: 180 tablet, Rfl: 2 .  potassium chloride SA (K-DUR,KLOR-CON) 20 MEQ tablet, Take 2 tablets (40 mEq total) by mouth daily., Disp: 180 tablet, Rfl: 1  She is compliant with this regimen.  SHe continues to have some leg swelling with norvasc however understands the importance of continuing.  She is prediabetic and tells me she has lost 2 lbs. Most recent lipid panel normal.  I have been encouraging her to get a sleep study done however she declines at this time.   She is allergic to ace inhibitors and lisinopril.   She  has a past medical history of Allergy, Hypertension, Sickle cell trait ([place]), and Tobacco abuse.    She  reports that she quit smoking about 4 years ago. She quit after 20.00 years of use. She has never used smokeless tobacco. She reports that she drinks alcohol. She reports that she does not use drugs. She  reports that she currently engages in sexual activity. The patient  has a past surgical history that includes Tubal ligation; Breast biopsy (Left, 2011); and Hernia repair.  Her family history includes Alcohol abuse in her father; Cancer in her mother; Hypertension in her brother and mother.  Review of Systems  Constitutional: Negative for diaphoresis.  Eyes: Negative.   Respiratory: Negative for cough, hemoptysis, sputum production, shortness of breath and wheezing.     Cardiovascular: Negative for chest pain, orthopnea and leg swelling.  Gastrointestinal: Negative for nausea.  Neurological: Negative for dizziness, tingling, tremors, sensory change, speech change, focal weakness, seizures, loss of consciousness, weakness and headaches.    The problem list and medications were reviewed and updated by myself where necessary and exist elsewhere in the encounter.   OBJECTIVE:  BP 126/72 (BP Location: Left Arm, Patient Position: Sitting, Cuff Size: Normal)   Pulse 74   Temp 98.2 F (36.8 C) (Oral)   Resp 18   Ht 5\' 5"  (1.651 m)   Wt 186 lb 6.4 oz (84.6 kg)   LMP 11/25/2017   SpO2 99%   BMI 31.02 kg/m   Physical Exam  Constitutional: She is oriented to person, place, and time. She appears well-nourished.  Non-toxic appearance. No distress.  Eyes: Pupils are equal, round, and reactive to light. EOM are normal.  Cardiovascular: Normal rate, regular rhythm, S1 normal, S2 normal, normal heart sounds and intact distal pulses. Exam reveals no gallop, no friction rub and no decreased pulses.  No murmur heard. Pulmonary/Chest: Effort normal. No stridor. No respiratory distress. She has no wheezes. She has no rales.  Abdominal: She exhibits no distension.  Musculoskeletal: She exhibits no edema.  Neurological: She is alert and oriented to person, place, and time. She displays normal reflexes. No cranial nerve deficit or sensory deficit. She exhibits normal muscle tone. Coordination and gait normal.  Skin: Skin is warm  and dry. No rash noted. She is not diaphoretic. No erythema. No pallor.  Psychiatric: She has a normal mood and affect.  Vitals reviewed.   No results found for this or any previous visit (from the past 72 hour(s)).  No results found.  ASSESSMENT AND PLAN:  Yolanda Jennings was seen today for follow-up.  Diagnoses and all orders for this visit:  Well-controlled hypertension: She is doing exquisitely well.  Prediabetes: Encouraged continued  weight loss.   Obesity: See problem two.     The patient is advised to call or return to clinic if she does not see an improvement in symptoms, or to seek the care of the closest emergency department if she worsens with the above plan.   Philis Fendt, MHS, PA-C [facility] 12/04/2017 5:02 PM

## 2018-04-14 LAB — HM MAMMOGRAPHY

## 2018-05-06 ENCOUNTER — Other Ambulatory Visit: Payer: Self-pay | Admitting: Physician Assistant

## 2018-05-06 DIAGNOSIS — I10 Essential (primary) hypertension: Secondary | ICD-10-CM

## 2018-05-15 ENCOUNTER — Telehealth: Payer: Self-pay | Admitting: Family Medicine

## 2018-05-15 NOTE — Telephone Encounter (Signed)
LVM for pt to call and reschedule their apt with Dr. Carlota Raspberry on 12/3 at 4:40. Dr. Carlota Raspberry will be unavailable at that time.   When pt calls back, please reschedule with Dr. Carlota Raspberry at their convenience.   Thank you!

## 2018-05-19 ENCOUNTER — Other Ambulatory Visit: Payer: Self-pay | Admitting: *Deleted

## 2018-05-19 ENCOUNTER — Other Ambulatory Visit: Payer: Self-pay | Admitting: Family Medicine

## 2018-05-19 DIAGNOSIS — I10 Essential (primary) hypertension: Secondary | ICD-10-CM

## 2018-05-19 MED ORDER — CARVEDILOL 6.25 MG PO TABS: ORAL_TABLET | ORAL | 1 refills | Status: DC

## 2018-05-19 MED ORDER — CARVEDILOL 6.25 MG PO TABS: ORAL_TABLET | ORAL | 0 refills | Status: DC

## 2018-05-19 NOTE — Telephone Encounter (Signed)
Copied from [place] [id number]. Topic: Quick Communication - Rx Refill/Question >> May 19, 2018  8:07 AM Nils Flack wrote: Medication: carvedilol (COREG) 6.25 MG tablet  Has the patient contacted their pharmacy? Yes.   (Agent: If no, request that the patient contact the pharmacy for the refill.) (Agent: If yes, when and what did the pharmacy advise?) to call office   Preferred Pharmacy (with phone number or street name): [facility] [address]  Pt is out of meds   Agent: Please be advised that RX refills may take up to 3 business days. We ask that you follow-up with your pharmacy.

## 2018-05-27 ENCOUNTER — Telehealth: Payer: Self-pay | Admitting: Family Medicine

## 2018-05-27 NOTE — Telephone Encounter (Signed)
LVM for pt to call the office and reschedule their appt. Due to schedule changes, their provider will not be available. When pt calls back, please reschedule at their convenience.   ° °Thank you!  °

## 2018-06-04 ENCOUNTER — Other Ambulatory Visit: Payer: Self-pay | Admitting: Family Medicine

## 2018-06-04 DIAGNOSIS — I10 Essential (primary) hypertension: Secondary | ICD-10-CM

## 2018-06-04 MED ORDER — CLONIDINE HCL 0.1 MG PO TABS
0.1000 mg | ORAL_TABLET | Freq: Three times a day (TID) | ORAL | 0 refills | Status: DC
Start: 2018-06-04 — End: 2018-06-24

## 2018-06-04 NOTE — Telephone Encounter (Signed)
Copied from [place] [id number]. Topic: Quick Communication - Rx Refill/Question >> Jun 04, 2018  8:09 AM Scherrie Gerlach wrote: Medication: cloNIDine (CATAPRES) 0.1 MG tablet Dr Tillie Fantasia office has rescheduled the pt's appt 2 times and she is now running out of this med.  Pt's next appt is 12/17 and needs refill to get her through to this day.  [place], [place] - [place] [phone] (Phone) [phone] (Fax)

## 2018-06-10 ENCOUNTER — Ambulatory Visit: Payer: Managed Care, Other (non HMO) | Admitting: Family Medicine

## 2018-06-18 ENCOUNTER — Ambulatory Visit: Payer: Managed Care, Other (non HMO) | Admitting: Family Medicine

## 2018-06-24 ENCOUNTER — Other Ambulatory Visit: Payer: Self-pay

## 2018-06-24 ENCOUNTER — Ambulatory Visit (INDEPENDENT_AMBULATORY_CARE_PROVIDER_SITE_OTHER): Payer: Managed Care, Other (non HMO) | Admitting: Family Medicine

## 2018-06-24 VITALS — BP 132/79 | HR 69 | Temp 98.4°F | Ht 65.0 in | Wt 174.4 lb

## 2018-06-24 DIAGNOSIS — I10 Essential (primary) hypertension: Secondary | ICD-10-CM

## 2018-06-24 DIAGNOSIS — R7303 Prediabetes: Secondary | ICD-10-CM

## 2018-06-24 DIAGNOSIS — Z13220 Encounter for screening for lipoid disorders: Secondary | ICD-10-CM

## 2018-06-24 MED ORDER — CHLORTHALIDONE 25 MG PO TABS: 25.0000 mg | ORAL_TABLET | Freq: Every day | ORAL | 1 refills | Status: DC

## 2018-06-24 MED ORDER — CLONIDINE HCL 0.1 MG PO TABS: 0.1000 mg | ORAL_TABLET | Freq: Three times a day (TID) | ORAL | 1 refills | Status: DC

## 2018-06-24 MED ORDER — CARVEDILOL 6.25 MG PO TABS: ORAL_TABLET | ORAL | 1 refills | Status: DC

## 2018-06-24 MED ORDER — AMLODIPINE BESYLATE 10 MG PO TABS: 10.0000 mg | ORAL_TABLET | Freq: Every day | ORAL | 1 refills | Status: DC

## 2018-06-24 NOTE — Progress Notes (Signed)
Subjective:    Patient ID: Yolanda Jennings, female    DOB: 1971/02/03, 47 y.o.   MRN: [id number]  HPI Yolanda Jennings is a 47 y.o. female Presents today for: Chief Complaint  Patient presents with  . routine check up  . Hypertension   Hypertension: BP Readings from Last 3 Encounters:  06/24/18 132/79  12/04/17 126/72  11/06/17 134/86   Lab Results  Component Value Date   CREATININE 1.05 (H) 11/06/2017  Last evaluated May with Yolanda Jennings.  Had some leg swelling with amlodipine, but continued same regimen. Has compression socks.  Takes Coreg 6.25 mg twice daily, amlodipine 10 mg daily, chlorthalidone 25 mg daily, clonidine 0.1 mg 3 times daily, on otc potassium supplement - 3 per day.  Potassium normal on labs in May. - same dose then.   Prediabetes: Losing weight. Has been watching diet, and exercising more - 2 days per week.   Wt Readings from Last 3 Encounters:  06/24/18 174 lb 6.4 oz (79.1 kg)  12/04/17 186 lb 6.4 oz (84.6 kg)  11/06/17 187 lb (84.8 kg)   Lab Results  Component Value Date   HGBA1C 6.0 (H) 11/06/2017        Patient Active Problem List   Diagnosis Date Noted  . Obesity (BMI 30.0-34.9) 08/22/2014  . Microcytic anemia 09/03/2013  . Headache 09/03/2013  . HTN (hypertension) 08/28/2013   Past Medical History:  Diagnosis Date  . Allergy   . Hypertension   . Sickle cell trait ([place])   . Tobacco abuse    Stopped smoking 08/24/13   Past Surgical History:  Procedure Laterality Date  . BREAST BIOPSY Left 2011  . HERNIA REPAIR    . TUBAL LIGATION     2003   Allergies  Allergen Reactions  . Ace Inhibitors Swelling and Other (See Comments)    Reaction:  Tongue swelling   . Lisinopril Swelling and Other (See Comments)    Reaction:  Tongue swelling    Prior to Admission medications   Medication Sig Start Date End Date Taking? Authorizing Provider  amLODipine (NORVASC) 10 MG tablet Take 1 tablet (10 mg total) by mouth daily. 11/06/17   Yes Tereasa Coop, PA-C  carvedilol (COREG) 6.25 MG tablet TAKE 1 TABLET BY MOUTH TWICE DAILY WITH A MEAL 05/19/18  Yes [name], MD  chlorthalidone (HYGROTON) 25 MG tablet Take 1 tablet (25 mg total) by mouth daily. 11/06/17  Yes Tereasa Coop, PA-C  cloNIDine (CATAPRES) 0.1 MG tablet Take 1 tablet (0.1 mg total) by mouth 3 (three) times daily. 06/04/18  Yes [name], MD  potassium chloride SA (K-DUR,KLOR-CON) 20 MEQ tablet Take 2 tablets (40 mEq total) by mouth daily. 11/06/17  Yes Tereasa Coop, PA-C  cyclobenzaprine (FLEXERIL) 10 MG tablet Take 0.5-1 tablets (5-10 mg total) by mouth 3 (three) times daily as needed. Patient not taking: Reported on 06/24/2018 12/04/17   Tereasa Coop, PA-C   Social History   Socioeconomic History  . Marital status: Single    Spouse name: Not on file  . Number of children: Not on file  . Years of education: Not on file  . Highest education level: Not on file  Occupational History  . Occupation: Freight forwarder  Social Needs  . Financial resource strain: Not on file  . Food insecurity:    Worry: Not on file    Inability: Not on file  . Transportation needs:    Medical: Not on  file    Non-medical: Not on file  Tobacco Use  . Smoking status: Former Smoker    Years: 20.00    Last attempt to quit: 08/24/2013    Years since quitting: 4.8  . Smokeless tobacco: Never Used  Substance and Sexual Activity  . Alcohol use: Yes    Alcohol/week: 0.0 standard drinks    Comment: occasional  . Drug use: No  . Sexual activity: Yes  Lifestyle  . Physical activity:    Days per week: Not on file    Minutes per session: Not on file  . Stress: Not on file  Relationships  . Social connections:    Talks on phone: Not on file    Gets together: Not on file    Attends religious service: Not on file    Active member of club or organization: Not on file    Attends meetings of clubs or organizations: Not on file    Relationship status:  Not on file  . Intimate partner violence:    Fear of current or ex partner: Not on file    Emotionally abused: Not on file    Physically abused: Not on file    Forced sexual activity: Not on file  Other Topics Concern  . Not on file  Social History Narrative   Marital status: Single      Children: 2 children; no grandchildren.      Lives: with two children.      Employment: Freight forwarder      Tobacco: quit 08/2013.       Alcohol:  Socially.      Exercise: Yes      Education: [employer].    Review of Systems  Constitutional: Negative for fatigue and unexpected weight change.  Respiratory: Negative for chest tightness and shortness of breath.   Cardiovascular: Negative for chest pain, palpitations and leg swelling.  Gastrointestinal: Negative for abdominal pain and blood in stool.  Neurological: Negative for dizziness, syncope, light-headedness and headaches.       Objective:   Physical Exam Vitals signs reviewed.  Constitutional:      Appearance: She is well-developed.  HENT:     Head: Normocephalic and atraumatic.  Eyes:     Conjunctiva/sclera: Conjunctivae normal.     Pupils: Pupils are equal, round, and reactive to light.  Neck:     Vascular: No carotid bruit.  Cardiovascular:     Rate and Rhythm: Normal rate and regular rhythm.     Heart sounds: Normal heart sounds.  Pulmonary:     Effort: Pulmonary effort is normal.     Breath sounds: Normal breath sounds.  Abdominal:     Palpations: Abdomen is soft. There is no pulsatile mass.     Tenderness: There is no abdominal tenderness.  Skin:    General: Skin is warm and dry.  Neurological:     Mental Status: She is alert and oriented to person, place, and time.  Psychiatric:        Behavior: Behavior normal.   no peripheral edema appreciated Vitals:   06/24/18 1031  BP: 132/79  Pulse: 69  Temp: 98.4 F (36.9 C)  TempSrc: Oral  SpO2: 98%  Weight: 174 lb 6.4 oz (79.1 kg)  Height: 5\' 5"  (1.651 m)        Assessment & Plan:   Yolanda Jennings is a 47 y.o. female Essential hypertension - Plan: Comprehensive metabolic panel, amLODipine (NORVASC) 10 MG tablet, carvedilol (COREG) 6.25 MG tablet,  cloNIDine (CATAPRES) 0.1 MG tablet  -Stable, continue same regimen.  Labs pending.  Anticipate decreased dose of medication needed as weight continues to improve.  Prediabetes - Plan: Hemoglobin A1c  -Commended on diet and exercise changes with current weight loss.  Repeat A1c  Screening for hyperlipidemia - Plan: Lipid panel  Well-controlled hypertension - Plan: chlorthalidone (HYGROTON) 25 MG tablet  Recheck 6 months for physical.  Meds ordered this encounter  Medications  . amLODipine (NORVASC) 10 MG tablet    Sig: Take 1 tablet (10 mg total) by mouth daily.    Dispense:  90 tablet    Refill:  1  . carvedilol (COREG) 6.25 MG tablet    Sig: TAKE 1 TABLET BY MOUTH TWICE DAILY WITH A MEAL    Dispense:  180 tablet    Refill:  1  . chlorthalidone (HYGROTON) 25 MG tablet    Sig: Take 1 tablet (25 mg total) by mouth daily.    Dispense:  90 tablet    Refill:  1  . cloNIDine (CATAPRES) 0.1 MG tablet    Sig: Take 1 tablet (0.1 mg total) by mouth 3 (three) times daily.    Dispense:  270 tablet    Refill:  1    Please consider 90 day supplies to promote better adherence   Patient Instructions      Great job on the diet changes, exercise for weight loss.  Continue to work towards most days per week of exercise and I think you will see continued weight loss and improvement.  Please let me know if there are questions.    No change in medications for now, but please follow-up in 6 months for physical.  I suspect we will be able to decrease dose of medications as weight continues to improve.  Thank you for coming in today.  If you have lab work done today you will be contacted with your lab results within the next 2 weeks.  If you have not heard from [place] then please contact us. The fastest way to  get your results is to register for My Chart.   IF you received an x-ray today, you will receive an invoice from [facility]. Please contact [facility] at [phone] with questions or concerns regarding your invoice.   IF you received labwork today, you will receive an invoice from [facility]. Please contact [facility] at [phone] with questions or concerns regarding your invoice.   Our billing staff will not be able to assist you with questions regarding bills from these companies.  You will be contacted with the lab results as soon as they are available. The fastest way to get your results is to activate your My Chart account. Instructions are located on the last page of this paperwork. If you have not heard from [place] regarding the results in 2 weeks, please contact this office.       Signed,   [name], MD [facility].  06/24/18 11:15 AM

## 2018-06-24 NOTE — Patient Instructions (Addendum)
    Great job on [employer] changes, exercise for weight loss.  Continue to work towards most days per week of exercise and I think you will see continued weight loss and improvement.  Please let me know if there are questions.    No change in medications for now, but please follow-up in 6 months for physical.  I suspect we will be able to decrease dose of medications as weight continues to improve.  Thank you for coming in today.  If you have lab work done today you will be contacted with your lab results within the next 2 weeks.  If you have not heard from [place] then please contact us. The fastest way to get your results is to register for My Chart.   IF you received an x-ray today, you will receive an invoice from [facility]. Please contact [facility] at [phone] with questions or concerns regarding your invoice.   IF you received labwork today, you will receive an invoice from [place]. Please contact [facility] at [phone] with questions or concerns regarding your invoice.   Our billing staff will not be able to assist you with questions regarding bills from these companies.  You will be contacted with the lab results as soon as they are available. The fastest way to get your results is to activate your My Chart account. Instructions are located on the last page of this paperwork. If you have not heard from [place] regarding the results in 2 weeks, please contact this office.

## 2018-06-25 LAB — COMPREHENSIVE METABOLIC PANEL
ALK PHOS: 125 IU/L — AB (ref 39–117)
ALT: 8 IU/L (ref 0–32)
AST: 12 IU/L (ref 0–40)
Albumin/Globulin Ratio: 1.2 (ref 1.2–2.2)
Albumin: 4.1 g/dL (ref 3.5–5.5)
BILIRUBIN TOTAL: 0.3 mg/dL (ref 0.0–1.2)
BUN/Creatinine Ratio: 10 (ref 9–23)
BUN: 10 mg/dL (ref 6–24)
CO2: 26 mmol/L (ref 20–29)
Calcium: 9.9 mg/dL (ref 8.7–10.2)
Chloride: 100 mmol/L (ref 96–106)
Creatinine, Ser: 1.02 mg/dL — ABNORMAL HIGH (ref 0.57–1.00)
GFR calc Af Amer: 75 mL/min/{1.73_m2} (ref >59)
GFR calc non Af Amer: 65 mL/min/{1.73_m2} (ref >59)
Globulin, Total: 3.4 g/dL (ref 1.5–4.5)
Glucose: 108 mg/dL — ABNORMAL HIGH (ref 65–99)
Potassium: 3.3 mmol/L — ABNORMAL LOW (ref 3.5–5.2)
SODIUM: 140 mmol/L (ref 134–144)
Total Protein: 7.5 g/dL (ref 6.0–8.5)

## 2018-06-25 LAB — LIPID PANEL
CHOLESTEROL TOTAL: 207 mg/dL — AB (ref 100–199)
Chol/HDL Ratio: 2.9 ratio (ref 0.0–4.4)
HDL: 71 mg/dL (ref >39)
LDL Calculated: 119 mg/dL — ABNORMAL HIGH (ref 0–99)
Triglycerides: 84 mg/dL (ref 0–149)
VLDL Cholesterol Cal: 17 mg/dL (ref 5–40)

## 2018-06-25 LAB — HEMOGLOBIN A1C
Est. average glucose Bld gHb Est-mCnc: 120 mg/dL
Hgb A1c MFr Bld: 5.8 % — ABNORMAL HIGH (ref 4.8–5.6)

## 2018-07-06 ENCOUNTER — Other Ambulatory Visit: Payer: Self-pay | Admitting: Family Medicine

## 2018-07-06 DIAGNOSIS — E876 Hypokalemia: Secondary | ICD-10-CM

## 2018-07-06 NOTE — Progress Notes (Signed)
See lab notes

## 2018-08-31 ENCOUNTER — Emergency Department (HOSPITAL_COMMUNITY)
Admission: EM | Admit: 2018-08-31 | Discharge: 2018-08-31 | Disposition: A | Discharge status: Home / Self Care | Payer: Managed Care, Other (non HMO) | Attending: Emergency Medicine | Admitting: Emergency Medicine

## 2018-08-31 ENCOUNTER — Encounter (HOSPITAL_COMMUNITY): Payer: Self-pay | Admitting: Emergency Medicine

## 2018-08-31 ENCOUNTER — Other Ambulatory Visit: Payer: Self-pay

## 2018-08-31 DIAGNOSIS — T148XXA Other injury of unspecified body region, initial encounter: Secondary | ICD-10-CM

## 2018-08-31 DIAGNOSIS — Z79899 Other long term (current) drug therapy: Secondary | ICD-10-CM | POA: Insufficient documentation

## 2018-08-31 DIAGNOSIS — I10 Essential (primary) hypertension: Secondary | ICD-10-CM | POA: Insufficient documentation

## 2018-08-31 DIAGNOSIS — R233 Spontaneous ecchymoses: Secondary | ICD-10-CM | POA: Insufficient documentation

## 2018-08-31 DIAGNOSIS — Z87891 Personal history of nicotine dependence: Secondary | ICD-10-CM | POA: Insufficient documentation

## 2018-08-31 LAB — CBC WITH DIFFERENTIAL/PLATELET
Abs Immature Granulocytes: 0.03 10*3/uL (ref 0.00–0.07)
Basophils Absolute: 0 10*3/uL (ref 0.0–0.1)
Basophils Relative: 1 %
Eosinophils Absolute: 0.2 10*3/uL (ref 0.0–0.5)
Eosinophils Relative: 2 %
HEMATOCRIT: 40.6 % (ref 36.0–46.0)
Hemoglobin: 13.2 g/dL (ref 12.0–15.0)
IMMATURE GRANULOCYTES: 0 %
LYMPHS ABS: 1.6 10*3/uL (ref 0.7–4.0)
LYMPHS PCT: 21 %
MCH: 25 pg — ABNORMAL LOW (ref 26.0–34.0)
MCHC: 32.5 g/dL (ref 30.0–36.0)
MCV: 77 fL — ABNORMAL LOW (ref 80.0–100.0)
Monocytes Absolute: 0.6 10*3/uL (ref 0.1–1.0)
Monocytes Relative: 8 %
Neutro Abs: 5.1 10*3/uL (ref 1.7–7.7)
Neutrophils Relative %: 68 %
Platelets: 378 10*3/uL (ref 150–400)
RBC: 5.27 MIL/uL — ABNORMAL HIGH (ref 3.87–5.11)
RDW: 14.6 % (ref 11.5–15.5)
WBC: 7.5 10*3/uL (ref 4.0–10.5)
nRBC: 0 % (ref 0.0–0.2)

## 2018-08-31 LAB — BASIC METABOLIC PANEL
Anion gap: 10 (ref 5–15)
BUN: 11 mg/dL (ref 6–20)
CO2: 25 mmol/L (ref 22–32)
Calcium: 9.6 mg/dL (ref 8.9–10.3)
Chloride: 100 mmol/L (ref 98–111)
Creatinine, Ser: 1.16 mg/dL — ABNORMAL HIGH (ref 0.44–1.00)
GFR calc Af Amer: >60 mL/min (ref >60)
GFR calc non Af Amer: 56 mL/min — ABNORMAL LOW (ref >60)
Glucose, Bld: 111 mg/dL — ABNORMAL HIGH (ref 70–99)
Potassium: 3.3 mmol/L — ABNORMAL LOW (ref 3.5–5.1)
Sodium: 135 mmol/L (ref 135–145)

## 2018-08-31 LAB — PROTIME-INR
INR: 0.9
Prothrombin Time: 12 seconds (ref 11.4–15.2)

## 2018-08-31 NOTE — ED Notes (Signed)
Patient verbalized understanding of discharge instructions and denies any further needs or questions at this time. VS stable. Patient ambulatory with steady gait.  

## 2018-08-31 NOTE — ED Triage Notes (Signed)
Pt reports bruising to both legs, reports over the last 2 weeks she had several on her calf that went away, reports she developed bruises to bilateral inner thighs. Pt denies swelling to legs, she does not take blood thinners. Pt reports she woke up the other morning with pain to L calf that went away.

## 2018-08-31 NOTE — ED Provider Notes (Signed)
[facility] EMERGENCY DEPARTMENT Provider Note   CSN: [id number] Arrival date & time: 08/31/18  1456    History   Chief Complaint Chief Complaint  Patient presents with  . Bruising to Legs    HPI Torunn Chancellor is a 48 y.o. female.     HPI 2 weeks ago the patient noted bruises on her lower legs.  She reports they were on her calves.  Those have subsequently resolved.  Now she noted a bunch of small bruises on her inner thighs.  She is not on any blood thinners.  Patient denies prior history of problems with bruising.  She is concerned about possible blood clots.  She is not having any easy bleeding of the gums.  She is not having any other easy bleeding or bruising in other areas.  She cannot think of anything that could have precipitated the bruising. Past Medical History:  Diagnosis Date  . Allergy   . Hypertension   . Sickle cell trait ([place])   . Tobacco abuse    Stopped smoking 08/24/13    Patient Active Problem List   Diagnosis Date Noted  . Obesity (BMI 30.0-34.9) 08/22/2014  . Microcytic anemia 09/03/2013  . Headache 09/03/2013  . HTN (hypertension) 08/28/2013    Past Surgical History:  Procedure Laterality Date  . BREAST BIOPSY Left 2011  . HERNIA REPAIR    . TUBAL LIGATION     2003     OB History    Gravida  2   Para  2   Term  2   Preterm      AB      Living  2     SAB      TAB      Ectopic      Multiple      Live Births  2            Home Medications    Prior to Admission medications   Medication Sig Start Date End Date Taking? Authorizing Provider  amLODipine (NORVASC) 10 MG tablet Take 1 tablet (10 mg total) by mouth daily. 06/24/18   [name], MD  carvedilol (COREG) 6.25 MG tablet TAKE 1 TABLET BY MOUTH TWICE DAILY WITH A MEAL 06/24/18   [name], MD  chlorthalidone (HYGROTON) 25 MG tablet Take 1 tablet (25 mg total) by mouth daily. 06/24/18   [name], MD  cloNIDine  (CATAPRES) 0.1 MG tablet Take 1 tablet (0.1 mg total) by mouth 3 (three) times daily. 06/24/18   [name], MD    Family History Family History  Problem Relation Age of Onset  . Cancer Mother        lung & cervical  . Hypertension Mother   . Alcohol abuse Father   . Hypertension Brother     Social History Social History   Tobacco Use  . Smoking status: Former Smoker    Years: 20.00    Last attempt to quit: 08/24/2013    Years since quitting: 5.0  . Smokeless tobacco: Never Used  Substance Use Topics  . Alcohol use: Yes    Alcohol/week: 0.0 standard drinks    Comment: occasional  . Drug use: No     Allergies   Ace inhibitors and Lisinopril   Review of Systems Review of Systems 10 Systems reviewed and are negative for acute change except as noted in the HPI.   Physical Exam Updated Vital Signs BP 129/81 (BP Location:  Right Arm)   Pulse 78   Temp 98.1 F (36.7 C) (Oral)   Resp 15   Ht 5\' 5"  (1.651 m)   Wt 79.8 kg   LMP 08/14/2018 (Approximate)   SpO2 99%   BMI 29.29 kg/m   Physical Exam Constitutional:      Appearance: She is well-developed.  HENT:     Head: Normocephalic and atraumatic.  Eyes:     Conjunctiva/sclera: Conjunctivae normal.  Neck:     Musculoskeletal: Neck supple.  Cardiovascular:     Rate and Rhythm: Normal rate and regular rhythm.     Heart sounds: Normal heart sounds.  Pulmonary:     Effort: Pulmonary effort is normal.     Breath sounds: Normal breath sounds.  Abdominal:     General: Bowel sounds are normal. There is no distension.     Palpations: Abdomen is soft.     Tenderness: There is no abdominal tenderness.  Musculoskeletal: Normal range of motion.     Comments: No groin lymphadenopathy.  Lower legs are normal without any peripheral edema or calf tenderness.  Lymphadenopathy:     Cervical: No cervical adenopathy.  Skin:    General: Skin is warm and dry.  Neurological:     Mental Status: She is alert and  oriented to person, place, and time.     GCS: GCS eye subscore is 4. GCS verbal subscore is 5. GCS motor subscore is 6.     Coordination: Coordination normal.          ED Treatments / Results  Labs (all labs ordered are listed, but only abnormal results are displayed) Labs Reviewed  BASIC METABOLIC PANEL - Abnormal; Notable for the following components:      Result Value   Potassium 3.3 (*)    Glucose, Bld 111 (*)    Creatinine, Ser 1.16 (*)    GFR calc non Af Amer 56 (*)    All other components within normal limits  CBC WITH DIFFERENTIAL/PLATELET - Abnormal; Notable for the following components:   RBC 5.27 (*)    MCV 77.0 (*)    MCH 25.0 (*)    All other components within normal limits  PROTIME-INR    EKG None  Radiology No results found.  Procedures Procedures (including critical care time)  Medications Ordered in ED Medications - No data to display   Initial Impression / Assessment and Plan / ED Course  I have reviewed the triage vital signs and the nursing notes.  Pertinent labs & imaging results that were available during my care of the patient were reviewed by me and considered in my medical decision making (see chart for details).        Clinically well-appearing patient.  No abnormalities of platelets or clotting times.  Patient has innocuous appearing superficial bruises on her legs.  Review of systems is otherwise negative.  At this time patient stable for follow-up with PCP.  Final Clinical Impressions(s) / ED Diagnoses   Final diagnoses:  Superficial bruising    ED Discharge Orders    None       Charlesetta Shanks, MD 08/31/18 [phone]

## 2018-12-25 ENCOUNTER — Encounter: Payer: Managed Care, Other (non HMO) | Admitting: Family Medicine

## 2018-12-31 ENCOUNTER — Other Ambulatory Visit: Payer: Self-pay | Admitting: Family Medicine

## 2018-12-31 DIAGNOSIS — I10 Essential (primary) hypertension: Secondary | ICD-10-CM

## 2018-12-31 NOTE — Telephone Encounter (Signed)
Forwarding medication refill to PCP for review. 

## 2018-12-31 NOTE — Telephone Encounter (Signed)
Patient will be out of her cloNIDine (CATAPRES) 0.1 MG tablet medication tomorrow.  She will need a refill up until her physical appt on 01/14/2019.

## 2019-01-01 NOTE — Telephone Encounter (Signed)
Patient is completely out of her bp medication and need a refill asap until her 01/14/2019 appt.

## 2019-01-01 NOTE — Telephone Encounter (Signed)
appt scheduled in July. Clonidine refilled.

## 2019-01-14 ENCOUNTER — Encounter: Payer: Self-pay | Admitting: Family Medicine

## 2019-01-14 ENCOUNTER — Ambulatory Visit (INDEPENDENT_AMBULATORY_CARE_PROVIDER_SITE_OTHER): Payer: Managed Care, Other (non HMO) | Admitting: Family Medicine

## 2019-01-14 ENCOUNTER — Other Ambulatory Visit: Payer: Self-pay

## 2019-01-14 VITALS — BP 128/74 | HR 89 | Temp 98.7°F | Ht 65.0 in | Wt 169.0 lb

## 2019-01-14 DIAGNOSIS — I10 Essential (primary) hypertension: Secondary | ICD-10-CM

## 2019-01-14 DIAGNOSIS — Z0001 Encounter for general adult medical examination with abnormal findings: Secondary | ICD-10-CM | POA: Diagnosis not present

## 2019-01-14 DIAGNOSIS — R238 Other skin changes: Secondary | ICD-10-CM | POA: Diagnosis not present

## 2019-01-14 DIAGNOSIS — R233 Spontaneous ecchymoses: Secondary | ICD-10-CM

## 2019-01-14 DIAGNOSIS — Z0000 Encounter for general adult medical examination without abnormal findings: Secondary | ICD-10-CM

## 2019-01-14 DIAGNOSIS — E785 Hyperlipidemia, unspecified: Secondary | ICD-10-CM | POA: Diagnosis not present

## 2019-01-14 DIAGNOSIS — E876 Hypokalemia: Secondary | ICD-10-CM

## 2019-01-14 DIAGNOSIS — R7303 Prediabetes: Secondary | ICD-10-CM

## 2019-01-14 MED ORDER — CARVEDILOL 6.25 MG PO TABS: ORAL_TABLET | ORAL | 1 refills | Status: DC

## 2019-01-14 MED ORDER — AMLODIPINE BESYLATE 10 MG PO TABS: 10.0000 mg | ORAL_TABLET | Freq: Every day | ORAL | 1 refills | Status: DC

## 2019-01-14 MED ORDER — CHLORTHALIDONE 25 MG PO TABS: 25.0000 mg | ORAL_TABLET | Freq: Every day | ORAL | 1 refills | Status: DC

## 2019-01-14 MED ORDER — CLONIDINE HCL 0.1 MG PO TABS: 0.1000 mg | ORAL_TABLET | Freq: Three times a day (TID) | ORAL | 1 refills | Status: DC

## 2019-01-14 NOTE — Progress Notes (Signed)
Subjective:    Patient ID: Yolanda Jennings, female    DOB: 12-Nov-1970, 48 y.o.   MRN: [id number]  HPI Yolanda Jennings is a 48 y.o. female Presents today for: Chief Complaint  Patient presents with  . Annual Exam    pap nx visit due to menstrual   Hypertension: BP Readings from Last 3 Encounters:  08/31/18 107/86  06/24/18 132/79  12/04/17 126/72   Lab Results  Component Value Date   CREATININE 1.16 (H) 08/31/2018  Currently on amlodipine 10 mg daily, carvedilol 6.25 mg twice daily, chlorthalidone 25 mg daily, clonidine 0.1 mg 3 times daily. No sign of renal artery stenosis on ultrasound in 2015 Cardiac echo 2015, moderate concentric hypertrophy but normal systolic function,  normal wall motion,  grade 2 diastolic dysfunction, mild atrial dilation.  Constitutional: Negative for fatigue and unexpected weight change.  Eyes: Negative for visual disturbance.  Respiratory: Negative for cough, chest tightness and shortness of breath.   Cardiovascular: Negative for chest pain, palpitations and leg swelling.  Gastrointestinal: Negative for abdominal pain and blood in stool.  Neurological: Negative for dizziness, light-headedness and headaches.  Home BP 128/70-80. No new side effects of meds.   Prediabetes: Wt Readings from Last 3 Encounters:  01/14/19 169 lb (76.7 kg)  08/31/18 176 lb (79.8 kg)  06/24/18 174 lb 6.4 oz (79.1 kg)   Lab Results  Component Value Date   HGBA1C 5.8 (H) 06/24/2018   Has changed diet - more salad, avoiding fried food.   Hyperlipidemia:  Lab Results  Component Value Date   CHOL 207 (H) 06/24/2018   HDL 71 06/24/2018   LDLCALC 119 (H) 06/24/2018   TRIG 84 06/24/2018   CHOLHDL 2.9 06/24/2018   Lab Results  Component Value Date   ALT 8 06/24/2018   AST 12 06/24/2018   ALKPHOS 125 (H) 06/24/2018   BILITOT 0.3 06/24/2018  diet/exercise approach.   The 10-year ASCVD risk score Mikey Bussing DC Brooke Bonito., et al., 2013) is: 1.1%   Values used to  calculate the score:     Age: 18 years     Sex: Female     Is Non-Hispanic African American: Yes     Diabetic: No     Tobacco smoker: No     Systolic Blood Pressure: 794 mmHg     Is BP treated: Yes     HDL Cholesterol: 71 mg/dL     Total Cholesterol: 207 mg/dL   History of anemia, with leg bruising. Normal CBC at emergency room visit for superficial bruising in February Lab Results  Component Value Date   WBC 7.5 08/31/2018   HGB 13.2 08/31/2018   HCT 40.6 08/31/2018   MCV 77.0 (L) 08/31/2018   PLT 378 08/31/2018  noted in legs/thighs.  Prior ones resolved, then returned about a week ago. Not on aspirin.  No herbal meds.  No gum bleeding. On menses currently -  Normal menses, no change in amount or duration (5days).  History of sickle cell trait.  No hematologist.  No new sports/injuries/activity.  No pets at home.   Hypokalemia: Lab Results  Component Value Date   K 3.3 (L) 08/31/2018  Rx pills were too big to take. Now taking otc potassium 1m - 3 per day. No recent change in doses. Has been trying to eat apples.    Cancer screening: Due for Pap testing, last one in March 2017.  She would like to defer that at this visit until next visit due to  current menses. Mammogram 04/14/2018 -no evidence of malignancy, repeat 1 year.  Immunization History  Administered Date(s) Administered  . Influenza Split 04/08/2017  . Influenza-Unspecified 04/08/2014, 04/09/2015, 04/18/2016  . Tdap 09/07/2015   Depression screen [facility] 2/9 01/14/2019 06/24/2018 12/04/2017 11/06/2017 06/10/2017  Decreased Interest 0 0 0 0 0  Down, Depressed, Hopeless 0 0 0 0 -  PHQ - 2 Score 0 0 0 0 0    Hearing Screening   125Hz 250Hz 500Hz 1000Hz 2000Hz 3000Hz 4000Hz 6000Hz 8000Hz  Right ear:           Left ear:             Visual Acuity Screening   Right eye Left eye Both eyes  Without correction: 20/30 20/25 20/25  With correction:     last optho visit 1 year ago.   Dental: Every 6 months.    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  No cp/dyspena with exercise.   Declined STI screening.    Patient Active Problem List   Diagnosis Date Noted  . Obesity (BMI 30.0-34.9) 08/22/2014  . Microcytic anemia 09/03/2013  . Headache 09/03/2013  . HTN (hypertension) 08/28/2013   Past Medical History:  Diagnosis Date  . Allergy   . Hypertension   . Sickle cell trait ([place])   . Tobacco abuse    Stopped smoking 08/24/13   Past Surgical History:  Procedure Laterality Date  . BREAST BIOPSY Left 2011  . HERNIA REPAIR    . TUBAL LIGATION     2003   Allergies  Allergen Reactions  . Ace Inhibitors Swelling and Other (See Comments)    Reaction:  Tongue swelling   . Lisinopril Swelling and Other (See Comments)    Reaction:  Tongue swelling    Prior to Admission medications   Medication Sig Start Date End Date Taking? Authorizing Provider  amLODipine (NORVASC) 10 MG tablet Take 1 tablet (10 mg total) by mouth daily. 06/24/18  Yes [name], MD  carvedilol (COREG) 6.25 MG tablet TAKE 1 TABLET BY MOUTH TWICE DAILY WITH A MEAL 06/24/18  Yes [name], MD  chlorthalidone (HYGROTON) 25 MG tablet Take 1 tablet (25 mg total) by mouth daily. 06/24/18  Yes [name], MD  cloNIDine (CATAPRES) 0.1 MG tablet TAKE 1 TABLET BY MOUTH THREE TIMES A DAY 01/01/19  Yes [name], MD   Social History   Socioeconomic History  . Marital status: Single    Spouse name: Not on file  . Number of children: Not on file  . Years of education: Not on file  . Highest education level: Not on file  Occupational History  . Occupation: Freight forwarder  Social Needs  . Financial resource strain: Not on file  . Food insecurity    Worry: Not on file    Inability: Not on file  . Transportation needs    Medical: Not on file    Non-medical: Not on file  Tobacco Use  . Smoking status: Former Smoker    Years: 20.00    Quit date:  08/24/2013    Years since quitting: 5.3  . Smokeless tobacco: Never Used  Substance and Sexual Activity  . Alcohol use: Yes    Alcohol/week: 0.0 standard drinks    Comment: occasional  . Drug use: No  . Sexual activity: Yes  Lifestyle  . Physical activity    Days per week: Not on file    Minutes  per session: Not on file  . Stress: Not on file  Relationships  . Social Herbalist on phone: Not on file    Gets together: Not on file    Attends religious service: Not on file    Active member of club or organization: Not on file    Attends meetings of clubs or organizations: Not on file    Relationship status: Not on file  . Intimate partner violence    Fear of current or ex partner: Not on file    Emotionally abused: Not on file    Physically abused: Not on file    Forced sexual activity: Not on file  Other Topics Concern  . Not on file  Social History Narrative   Marital status: Single      Children: 2 children; no grandchildren.      Lives: with two children.      Employment: Freight forwarder      Tobacco: quit 08/2013.       Alcohol:  Socially.      Exercise: Yes      Education: [employer].    Review of Systems 13 point review of systems per patient health survey noted.  Negative other than as indicated above or in HPI.      Objective:   Physical Exam Constitutional:      Appearance: She is well-developed.  HENT:     Head: Normocephalic and atraumatic.     Right Ear: External ear normal.     Left Ear: External ear normal.  Eyes:     Conjunctiva/sclera: Conjunctivae normal.     Pupils: Pupils are equal, round, and reactive to light.  Neck:     Musculoskeletal: Normal range of motion and neck supple.     Thyroid: No thyromegaly.  Cardiovascular:     Rate and Rhythm: Normal rate and regular rhythm.     Heart sounds: Normal heart sounds. No murmur.  Pulmonary:     Effort: Pulmonary effort is normal. No respiratory distress.     Breath sounds: Normal  breath sounds. No wheezing.  Abdominal:     General: Bowel sounds are normal.     Palpations: Abdomen is soft.     Tenderness: There is no abdominal tenderness.  Musculoskeletal: Normal range of motion.        General: No tenderness.  Lymphadenopathy:     Cervical: No cervical adenopathy.  Skin:    General: Skin is warm and dry.     Findings: Bruising (small approx 2 cm healing bruise top of left thigh) present. No rash.  Neurological:     Mental Status: She is alert and oriented to person, place, and time.  Psychiatric:        Behavior: Behavior normal.        Thought Content: Thought content normal.    Vitals:   01/14/19 1332  Pulse: 89  Temp: 98.7 F (37.1 C)  TempSrc: Oral  SpO2: 100%  Weight: 169 lb (76.7 kg)  Height: 5' 5" (1.651 m)   Wt Readings from Last 3 Encounters:  01/14/19 169 lb (76.7 kg)  08/31/18 176 lb (79.8 kg)  06/24/18 174 lb 6.4 oz (79.1 kg)      Assessment & Plan:  Yolanda Jennings is a 48 y.o. female Annual physical exam  - -anticipatory guidance as below in AVS, screening labs above. Health maintenance items as above in HPI discussed/recommended as applicable.   Hypokalemia  -Continue over-the-counter supplement,  potassium rich foods, will check labs.  Follow-up in few weeks to discuss further.  Easy bruising - Plan: CBC, Protime-INR  -Single bruise on thigh, no other concerning findings.  Check CBC, INR, and follow-up to discuss -  Sooner if worsening  Essential hypertension - Plan: CMP14+EGFR  -Stable home readings.  Continue same regimen, recheck in few weeks.  Goal to decrease clonidine to BID.   Hyperlipidemia, unspecified hyperlipidemia type - Plan: Lipid panel  -Low ASCVD risk based on previous calculations.  Commended on weight loss with diet/exercise, repeat testing  Prediabetes - Plan: Hemoglobin A1c  -As above, commended on weight loss.  Check A1c  No orders of the defined types were placed in this encounter.  Patient  Instructions    For potassium- try to incorporate potassium rich foods in the diet, and continue supplement.  If still low can follow up to discuss other testing or change in meds.   I will check some blood tests for bruising but can discuss this at next visit.  I do not see any specific concerns on exam today.    As home blood pressures are better than in office I am going to leave medications the same but we can recheck that at next visit.  Pap test at next visit as well.  Thanks for coming in today and stay safe.   Keeping You Healthy  Get These Tests 1. Blood Pressure- Have your blood pressure checked once a year by your health care provider.  Normal blood pressure is 120/80. 2. Weight- Have your body mass index (BMI) calculated to screen for obesity.  BMI is measure of body fat based on height and weight.  You can also calculate your own BMI at GravelBags.it. 3. Cholesterol- Have your cholesterol checked every 5 years starting at age 46 then yearly starting at age 39. 66. Chlamydia, HIV, and other sexually transmitted diseases- Get screened every year until age 60, then within three months of each new sexual provider. 5. Pap Test - Every 1-5 years; discuss with your health care provider. 6. Mammogram- Every 1-2 years starting at age 61--50  Take these medicines  Calcium with Vitamin D-Your body needs 1200 mg of Calcium each day and [phone] IU of Vitamin D daily.  Your body can only absorb 500 mg of Calcium at a time so Calcium must be taken in 2 or 3 divided doses throughout the day.  Multivitamin with folic acid- Once daily if it is possible for you to become pregnant.  Get these Immunizations  Gardasil-Series of three doses; prevents HPV related illness such as genital warts and cervical cancer.  Menactra-Single dose; prevents meningitis.  Tetanus shot- Every 10 years.  Flu shot-Every year.  Take these steps 1. Do not smoke-Your healthcare provider can help  you quit.  For tips on how to quit go to www.smokefree.gov or call 1-800 QUITNOW. 2. Be physically active- Exercise 5 days a week for at least 30 minutes.  If you are not already physically active, start slow and gradually work up to 30 minutes of moderate physical activity.  Examples of moderate activity include walking briskly, dancing, swimming, bicycling, etc. 3. Breast Cancer- A self breast exam every month is important for early detection of breast cancer.  For more information and instruction on self breast exams, ask your healthcare provider or https://www.patel.info/. 4. Eat a healthy diet- Eat a variety of healthy foods such as fruits, vegetables, whole grains, low fat milk, low fat cheeses, yogurt, lean meats,  poultry and fish, beans, nuts, tofu, etc.  For more information go to www. Thenutritionsource.org 5. Drink alcohol in moderation- Limit alcohol intake to one drink or less per day. Never drink and drive. 6. Depression- Your emotional health is as important as your physical health.  If you're feeling down or losing interest in things you normally enjoy please talk to your healthcare provider about being screened for depression. 7. Dental visit- Brush and floss your teeth twice daily; visit your dentist twice a year. 8. Eye doctor- Get an eye exam at least every 2 years. 9. Helmet use- Always wear a helmet when riding a bicycle, motorcycle, rollerblading or skateboarding. 69. Safe sex- If you may be exposed to sexually transmitted infections, use a condom. 11. Seat belts- Seat belts can save your live; always wear one. 12. Smoke/Carbon Monoxide detectors- These detectors need to be installed on the appropriate level of your home. Replace batteries at least once a year. 13. Skin cancer- When out in the sun please cover up and use sunscreen 15 SPF or higher. 14. Violence- If anyone is threatening or hurting you, please tell your healthcare provider.           If you have lab work done today you will be contacted with your lab results within the next 2 weeks.  If you have not heard from [place] then please contact us. The fastest way to get your results is to register for My Chart.   IF you received an x-ray today, you will receive an invoice from [facility] Rad[facility]. Please contact [facility] Radiology at [phone] with questions or concerns regarding your invoice.   IF you received labwork today, you will receive an invoice from [place]. Please contact [facility] at [phone] with questions or concerns regarding your invoice.   Our billing staff will not be able to assist you with questions regarding bills from these companies.  You will be contacted with the lab results as soon as they are available. The fastest way to get your results is to activate your My Chart account. Instructions are located on the last page of this paperwork. If you have not heard from [place] regarding the results in 2 weeks, please contact this office.       Signed,   [name], MD [facility] [place].  01/14/19 2:22 PM

## 2019-01-14 NOTE — Patient Instructions (Addendum)
For potassium- try to incorporate potassium rich foods in the diet, and continue supplement.  If still low can follow up to discuss other testing or change in meds.   I will check some blood tests for bruising but can discuss this at next visit.  I do not see any specific concerns on exam today.    As home blood pressures are better than in office I am going to leave medications the same but we can recheck that at next visit.  Pap test at next visit as well.  Thanks for coming in today and stay safe.   Keeping You Healthy  Get These Tests 1. Blood Pressure- Have your blood pressure checked once a year by your health care provider.  Normal blood pressure is 120/80. 2. Weight- Have your body mass index (BMI) calculated to screen for obesity.  BMI is measure of body fat based on height and weight.  You can also calculate your own BMI at [employer]ags[employer]. 3. Cholesterol- Have your cholesterol checked every 5 years starting at age 49 then yearly starting at age 49. 77. Chlamydia, HIV, and other sexually transmitted diseases- Get screened every year until age 49, then within three months of each new sexual provider. 5. Pap Test - Every 1-5 years; discuss with your health care provider. 6. Mammogram- Every 1-2 years starting at age 49--50  Take these medicines  Calcium with Vitamin D-Your body needs 1200 mg of Calcium each day and [phone] IU of Vitamin D daily.  Your body can only absorb 500 mg of Calcium at a time so Calcium must be taken in 2 or 3 divided doses throughout the day.  Multivitamin with folic acid- Once daily if it is possible for you to become pregnant.  Get these Immunizations  Gardasil-Series of three doses; prevents HPV related illness such as genital warts and cervical cancer.  Menactra-Single dose; prevents meningitis.  Tetanus shot- Every 10 years.  Flu shot-Every year.  Take these steps 1. Do not smoke-Your healthcare provider can help you quit.  For tips  on how to quit go to www.smokefree.gov or call 1-800 QUITNOW. 2. Be physically active- Exercise 5 days a week for at least 30 minutes.  If you are not already physically active, start slow and gradually work up to 30 minutes of moderate physical activity.  Examples of moderate activity include walking briskly, dancing, swimming, bicycling, etc. 3. Breast Cancer- A self breast exam every month is important for early detection of breast cancer.  For more information and instruction on self breast exams, ask your healthcare provider or https://www.patel.info/. 4. Eat a healthy diet- Eat a variety of healthy foods such as fruits, vegetables, whole grains, low fat milk, low fat cheeses, yogurt, lean meats, poultry and fish, beans, nuts, tofu, etc.  For more information go to www. Thenutritionsource.org 5. Drink alcohol in moderation- Limit alcohol intake to one drink or less per day. Never drink and drive. 6. Depression- Your emotional health is as important as your physical health.  If you're feeling down or losing interest in things you normally enjoy please talk to your healthcare provider about being screened for depression. 7. Dental visit- Brush and floss your teeth twice daily; visit your dentist twice a year. 8. Eye doctor- Get an eye exam at least every 2 years. 9. Helmet use- Always wear a helmet when riding a bicycle, motorcycle, rollerblading or skateboarding. 70. Safe sex- If you may be exposed to sexually transmitted infections, use a condom. 11. Seat  belts- Seat belts can save your live; always wear one. 12. Smoke/Carbon Monoxide detectors- These detectors need to be installed on the appropriate level of your home. Replace batteries at least once a year. 13. Skin cancer- When out in the sun please cover up and use sunscreen 15 SPF or higher. 14. Violence- If anyone is threatening or hurting you, please tell your healthcare provider.          If you have lab work  done today you will be contacted with your lab results within the next 2 weeks.  If you have not heard from [place] then please contact us. The fastest way to get your results is to register for My Chart.   IF you received an x-ray today, you will receive an invoice from [facility]iology. Please contact [facility] Radiology at [phone] with questions or concerns regarding your invoice.   IF you received labwork today, you will receive an invoice from [place]. Please contact [facility] at [phone] with questions or concerns regarding your invoice.   Our billing staff will not be able to assist you with questions regarding bills from these companies.  You will be contacted with the lab results as soon as they are available. The fastest way to get your results is to activate your My Chart account. Instructions are located on the last page of this paperwork. If you have not heard from [place] regarding the results in 2 weeks, please contact this office.

## 2019-01-15 LAB — CMP14+EGFR
ALT: 8 IU/L (ref 0–32)
AST: 16 IU/L (ref 0–40)
Albumin/Globulin Ratio: 1.2 (ref 1.2–2.2)
Albumin: 4.3 g/dL (ref 3.8–4.8)
Alkaline Phosphatase: 115 IU/L (ref 39–117)
BUN/Creatinine Ratio: 10 (ref 9–23)
BUN: 9 mg/dL (ref 6–24)
Bilirubin Total: 0.3 mg/dL (ref 0.0–1.2)
CO2: 24 mmol/L (ref 20–29)
Calcium: 9.9 mg/dL (ref 8.7–10.2)
Chloride: 101 mmol/L (ref 96–106)
Creatinine, Ser: 0.93 mg/dL (ref 0.57–1.00)
GFR calc Af Amer: 83 mL/min/{1.73_m2} (ref >59)
GFR calc non Af Amer: 72 mL/min/{1.73_m2} (ref >59)
Globulin, Total: 3.6 g/dL (ref 1.5–4.5)
Glucose: 104 mg/dL — ABNORMAL HIGH (ref 65–99)
Potassium: 3.3 mmol/L — ABNORMAL LOW (ref 3.5–5.2)
Sodium: 141 mmol/L (ref 134–144)
Total Protein: 7.9 g/dL (ref 6.0–8.5)

## 2019-01-15 LAB — CBC
Hematocrit: 40.6 % (ref 34.0–46.6)
Hemoglobin: 13.6 g/dL (ref 11.1–15.9)
MCH: 26.5 pg — ABNORMAL LOW (ref 26.6–33.0)
MCHC: 33.5 g/dL (ref 31.5–35.7)
MCV: 79 fL (ref 79–97)
Platelets: 376 10*3/uL (ref 150–450)
RBC: 5.14 x10E6/uL (ref 3.77–5.28)
RDW: 14.3 % (ref 11.7–15.4)
WBC: 4.9 10*3/uL (ref 3.4–10.8)

## 2019-01-15 LAB — PROTIME-INR
INR: 0.9 (ref 0.8–1.2)
Prothrombin Time: 10.1 s (ref 9.1–12.0)

## 2019-01-15 LAB — LIPID PANEL
Chol/HDL Ratio: 3 ratio (ref 0.0–4.4)
Cholesterol, Total: 221 mg/dL — ABNORMAL HIGH (ref 100–199)
HDL: 73 mg/dL (ref >39)
LDL Calculated: 132 mg/dL — ABNORMAL HIGH (ref 0–99)
Triglycerides: 82 mg/dL (ref 0–149)
VLDL Cholesterol Cal: 16 mg/dL (ref 5–40)

## 2019-01-15 LAB — HEMOGLOBIN A1C
Est. average glucose Bld gHb Est-mCnc: 120 mg/dL
Hgb A1c MFr Bld: 5.8 % — ABNORMAL HIGH (ref 4.8–5.6)

## 2019-01-21 ENCOUNTER — Encounter (HOSPITAL_COMMUNITY): Payer: Self-pay | Admitting: Radiology

## 2019-02-04 ENCOUNTER — Other Ambulatory Visit (HOSPITAL_COMMUNITY)
Admission: RE | Admit: 2019-02-04 | Discharge: 2019-02-04 | Disposition: A | Discharge status: Home / Self Care | Payer: Managed Care, Other (non HMO) | Source: Ambulatory Visit | Attending: Family Medicine | Admitting: Family Medicine

## 2019-02-04 ENCOUNTER — Ambulatory Visit (INDEPENDENT_AMBULATORY_CARE_PROVIDER_SITE_OTHER): Payer: Managed Care, Other (non HMO) | Admitting: Family Medicine

## 2019-02-04 ENCOUNTER — Encounter: Payer: Self-pay | Admitting: Family Medicine

## 2019-02-04 ENCOUNTER — Other Ambulatory Visit: Payer: Self-pay

## 2019-02-04 VITALS — BP 129/79 | HR 86 | Temp 98.2°F | Resp 12 | Wt 171.6 lb

## 2019-02-04 DIAGNOSIS — I10 Essential (primary) hypertension: Secondary | ICD-10-CM | POA: Diagnosis not present

## 2019-02-04 DIAGNOSIS — Z124 Encounter for screening for malignant neoplasm of cervix: Secondary | ICD-10-CM

## 2019-02-04 DIAGNOSIS — E876 Hypokalemia: Secondary | ICD-10-CM

## 2019-02-04 NOTE — Patient Instructions (Addendum)
Try clonidine twice per day and follow blood pressures closely. If runs over 140/90 then restart at three times per day.  Continue potassium rich foods and I will check that level today.   Recheck in 3 months.    If you have lab work done today you will be contacted with your lab results within the next 2 weeks.  If you have not heard from [place] then please contact us. The fastest way to get your results is to register for My Chart.   IF you received an x-ray today, you will receive an invoice from [facility]. Please contact [facility] Radiology at [phone] with questions or concerns regarding your invoice.   IF you received labwork today, you will receive an invoice from [place]. Please contact [facility] at [phone] with questions or concerns regarding your invoice.   Our billing staff will not be able to assist you with questions regarding bills from these companies.  You will be contacted with the lab results as soon as they are available. The fastest way to get your results is to activate your My Chart account. Instructions are located on the last page of this paperwork. If you have not heard from [place] regarding the results in 2 weeks, please contact this office.

## 2019-02-04 NOTE — Progress Notes (Signed)
Subjective:    Patient ID: Yolanda Jennings, female    DOB: 1970/09/06, 48 y.o.   MRN: [id number]  HPI Yolanda Jennings is a 48 y.o. female Presents today for: Chief Complaint  Patient presents with  . chronic medical condition    Patient was last seen here 01/14/19 here to f/u on BP,labs, and a pap test   Hypertension: BP Readings from Last 3 Encounters:  02/04/19 129/79  01/14/19 128/74  08/31/18 107/86   Lab Results  Component Value Date   CREATININE 0.93 01/14/2019   Control at last visit, ultimate plan to try to decrease Klonopin to twice daily.  Does have history of hypokalemia.  Discussed continue over-the-counter supplement and increase in potassium rich foods at last visit. Increased apples, raisins, decreased fried food. Still on otc  99mg  potassium pill - 4 per day.   Lab Results  Component Value Date   K 3.3 (L) 01/14/2019    Cervical cancer screening, plan for Pap testing today.   Patient Active Problem List   Diagnosis Date Noted  . Obesity (BMI 30.0-34.9) 08/22/2014  . Microcytic anemia 09/03/2013  . Headache 09/03/2013  . HTN (hypertension) 08/28/2013   Past Medical History:  Diagnosis Date  . Allergy   . Hypertension   . Sickle cell trait ([place])   . Tobacco abuse    Stopped smoking 08/24/13   Past Surgical History:  Procedure Laterality Date  . BREAST BIOPSY Left 2011  . HERNIA REPAIR    . TUBAL LIGATION     2003   Allergies  Allergen Reactions  . Ace Inhibitors Swelling and Other (See Comments)    Reaction:  Tongue swelling   . Lisinopril Swelling and Other (See Comments)    Reaction:  Tongue swelling    Prior to Admission medications   Medication Sig Start Date End Date Taking? Authorizing Provider  amLODipine (NORVASC) 10 MG tablet Take 1 tablet (10 mg total) by mouth daily. 01/14/19  Yes [name], MD  carvedilol (COREG) 6.25 MG tablet TAKE 1 TABLET BY MOUTH TWICE DAILY WITH A MEAL 01/14/19  Yes [name], MD   chlorthalidone (HYGROTON) 25 MG tablet Take 1 tablet (25 mg total) by mouth daily. 01/14/19  Yes [name], MD  cloNIDine (CATAPRES) 0.1 MG tablet Take 1 tablet (0.1 mg total) by mouth 3 (three) times daily. 01/14/19  Yes [name], MD   Social History   Socioeconomic History  . Marital status: Single    Spouse name: Not on file  . Number of children: Not on file  . Years of education: Not on file  . Highest education level: Not on file  Occupational History  . Occupation: Freight forwarder  Social Needs  . Financial resource strain: Not on file  . Food insecurity    Worry: Not on file    Inability: Not on file  . Transportation needs    Medical: Not on file    Non-medical: Not on file  Tobacco Use  . Smoking status: Former Smoker    Years: 20.00    Quit date: 08/24/2013    Years since quitting: 5.4  . Smokeless tobacco: Never Used  Substance and Sexual Activity  . Alcohol use: Yes    Alcohol/week: 0.0 standard drinks    Comment: occasional  . Drug use: No  . Sexual activity: Yes  Lifestyle  . Physical activity    Days per week: Not on file    Minutes  per session: Not on file  . Stress: Not on file  Relationships  . Social Herbalist on phone: Not on file    Gets together: Not on file    Attends religious service: Not on file    Active member of club or organization: Not on file    Attends meetings of clubs or organizations: Not on file    Relationship status: Not on file  . Intimate partner violence    Fear of current or ex partner: Not on file    Emotionally abused: Not on file    Physically abused: Not on file    Forced sexual activity: Not on file  Other Topics Concern  . Not on file  Social History Narrative   Marital status: Single      Children: 2 children; no grandchildren.      Lives: with two children.      Employment: Freight forwarder      Tobacco: quit 08/2013.       Alcohol:  Socially.      Exercise: Yes      Education:  [employer].    Review of Systems     Objective:   Physical Exam Vitals signs reviewed. Exam conducted with a chaperone present.  Constitutional:      Appearance: She is well-developed.  HENT:     Head: Normocephalic and atraumatic.  Eyes:     Conjunctiva/sclera: Conjunctivae normal.     Pupils: Pupils are equal, round, and reactive to light.  Neck:     Vascular: No carotid bruit.  Cardiovascular:     Rate and Rhythm: Normal rate and regular rhythm.     Heart sounds: Normal heart sounds.  Pulmonary:     Effort: Pulmonary effort is normal.     Breath sounds: Normal breath sounds.  Abdominal:     Palpations: Abdomen is soft. There is no pulsatile mass.     Tenderness: There is no abdominal tenderness.  Genitourinary:    Exam position: Lithotomy position.     Pubic Area: No rash.      Labia:        Right: No lesion.        Left: No lesion.      Vagina: Normal.     Cervix: Normal.     Uterus: Normal.      Adnexa: Right adnexa normal and left adnexa normal.       Right: No tenderness.         Left: No tenderness.    Skin:    General: Skin is warm and dry.  Neurological:     Mental Status: She is alert and oriented to person, place, and time.  Psychiatric:        Behavior: Behavior normal.    Vitals:   02/04/19 1511  BP: 129/79  Pulse: 86  Resp: 12  Temp: 98.2 F (36.8 C)  TempSrc: Oral  SpO2: 99%  Weight: 171 lb 9.6 oz (77.8 kg)      Assessment & Plan:  Druscilla Petsch is a 48 y.o. female Hypokalemia - Plan: Basic metabolic panel  -Repeat testing, continue supplement and potassium rich foods.  If persistent low readings, would consider other testing if history of hypertension.  Screening for cervical cancer - Plan: Cytology - PAP([place])  Essential hypertension  -Controlled, option of twice daily dosing of clonidine with close follow-up of blood pressure readings.  RTC precautions  No orders of the defined types  were placed in this encounter.   Patient Instructions   Try clonidine twice per day and follow blood pressures closely. If runs over 140/90 then restart at three times per day.  Continue potassium rich foods and I will check that level today.   Recheck in 3 months.    If you have lab work done today you will be contacted with your lab results within the next 2 weeks.  If you have not heard from [place] then please contact us. The fastest way to get your results is to register for My Chart.   IF you received an x-ray today, you will receive an invoice from [facility]. Please contact [facility]iology at [phone] with questions or concerns regarding your invoice.   IF you received labwork today, you will receive an invoice from [place]. Please contact [facility] at [phone] with questions or concerns regarding your invoice.   Our billing staff will not be able to assist you with questions regarding bills from these companies.  You will be contacted with the lab results as soon as they are available. The fastest way to get your results is to activate your My Chart account. Instructions are located on the last page of this paperwork. If you have not heard from [place] regarding the results in 2 weeks, please contact this office.       Signed,   [name], MD [facility].  02/07/19 10:31 AM

## 2019-02-05 LAB — BASIC METABOLIC PANEL
BUN/Creatinine Ratio: 13 (ref 9–23)
BUN: 13 mg/dL (ref 6–24)
CO2: 23 mmol/L (ref 20–29)
Calcium: 9.5 mg/dL (ref 8.7–10.2)
Chloride: 95 mmol/L — ABNORMAL LOW (ref 96–106)
Creatinine, Ser: 1 mg/dL (ref 0.57–1.00)
GFR calc Af Amer: 76 mL/min/{1.73_m2} (ref >59)
GFR calc non Af Amer: 66 mL/min/{1.73_m2} (ref >59)
Glucose: 99 mg/dL (ref 65–99)
Potassium: 3.2 mmol/L — ABNORMAL LOW (ref 3.5–5.2)
Sodium: 137 mmol/L (ref 134–144)

## 2019-02-07 ENCOUNTER — Encounter: Payer: Self-pay | Admitting: Family Medicine

## 2019-02-08 LAB — CYTOLOGY - PAP: Diagnosis: NEGATIVE

## 2019-02-10 ENCOUNTER — Other Ambulatory Visit: Payer: Self-pay | Admitting: Family Medicine

## 2019-02-10 DIAGNOSIS — E876 Hypokalemia: Secondary | ICD-10-CM

## 2019-02-10 MED ORDER — POTASSIUM CHLORIDE CRYS ER 20 MEQ PO TBCR: 20.0000 meq | EXTENDED_RELEASE_TABLET | Freq: Every day | ORAL | 3 refills | Status: DC

## 2019-02-17 ENCOUNTER — Telehealth: Payer: Self-pay | Admitting: Family Medicine

## 2019-02-17 NOTE — Telephone Encounter (Signed)
Pt returned Dr. Vonna Kotyk vm in regards to results. Stated that she could speak to someone in the lab

## 2019-02-17 NOTE — Telephone Encounter (Signed)
Spoke with pt about her lab results, she verbalized understanding. She has a lab visit set for 02/23/2019 at 8 am. Pt would like to know if there is a smaller tablet because the current one is to big for her to swallow? Please advise

## 2019-02-17 NOTE — Telephone Encounter (Signed)
Not that I am aware of, but have her discuss with pharmacist to see if other formulation may be easier to take and can change if needed. Thanks.

## 2019-02-18 NOTE — Telephone Encounter (Signed)
Lm for pt, when pt calls pls relay what Carlota Raspberry said....thanks

## 2019-02-23 ENCOUNTER — Ambulatory Visit: Payer: Managed Care, Other (non HMO)

## 2019-02-23 ENCOUNTER — Other Ambulatory Visit: Payer: Self-pay

## 2019-02-23 DIAGNOSIS — E876 Hypokalemia: Secondary | ICD-10-CM

## 2019-02-27 LAB — BASIC METABOLIC PANEL
Chloride: 98 mmol/L (ref 96–106)
Creatinine, Ser: 1.02 mg/dL — ABNORMAL HIGH (ref 0.57–1.00)

## 2019-03-10 ENCOUNTER — Telehealth: Payer: Self-pay | Admitting: Emergency Medicine

## 2019-03-10 ENCOUNTER — Other Ambulatory Visit: Payer: Self-pay | Admitting: Family Medicine

## 2019-03-10 DIAGNOSIS — I10 Essential (primary) hypertension: Secondary | ICD-10-CM

## 2019-03-10 DIAGNOSIS — E876 Hypokalemia: Secondary | ICD-10-CM

## 2019-03-10 NOTE — Telephone Encounter (Signed)
DR Carlota Raspberry patient was given the below msg and stated it is ok to referr her out to a specialist. Last letter states>>>>Electrolytes were overall stable, potassium looks okay.  Aldosterone level looked okay but renin level was slightly elevated.  I would like to refer her to a specialist to discuss these results and further testing if needed.  Let me know if that is okay, and if she has questions I am happy to call her as well.

## 2019-03-10 NOTE — Progress Notes (Signed)
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 Will initially refer to endocrinology for further interpretation of recent tests and guidance on other testing vs cardiology or nephrology eval with HTN and hypokalemia.

## 2019-03-17 ENCOUNTER — Encounter: Payer: Self-pay | Admitting: Radiology

## 2019-04-16 ENCOUNTER — Encounter: Payer: Self-pay | Admitting: Family Medicine

## 2019-04-17 ENCOUNTER — Other Ambulatory Visit: Payer: Self-pay

## 2019-04-20 ENCOUNTER — Encounter: Payer: Self-pay | Admitting: Internal Medicine

## 2019-04-20 ENCOUNTER — Ambulatory Visit (INDEPENDENT_AMBULATORY_CARE_PROVIDER_SITE_OTHER): Payer: Managed Care, Other (non HMO) | Admitting: Internal Medicine

## 2019-04-20 ENCOUNTER — Other Ambulatory Visit: Payer: Self-pay

## 2019-04-20 VITALS — BP 160/88 | HR 97 | Ht 65.0 in | Wt 173.2 lb

## 2019-04-20 DIAGNOSIS — E876 Hypokalemia: Secondary | ICD-10-CM

## 2019-04-20 DIAGNOSIS — I10 Essential (primary) hypertension: Secondary | ICD-10-CM | POA: Diagnosis not present

## 2019-04-20 LAB — POTASSIUM: Potassium: 3.3 mEq/L — ABNORMAL LOW (ref 3.5–5.1)

## 2019-04-20 MED ORDER — DEXAMETHASONE 1 MG PO TABS: 1.0000 mg | ORAL_TABLET | Freq: Once | ORAL | 0 refills | Status: AC

## 2019-04-20 MED ORDER — CARVEDILOL 6.25 MG PO TABS: 12.5000 mg | ORAL_TABLET | Freq: Two times a day (BID) | ORAL | 1 refills | Status: DC

## 2019-04-20 NOTE — Progress Notes (Signed)
Name: Yolanda Jennings  MRN/ DOB: [id number], 02-13-1971    Age/ Sex: 48 y.o., female    PCP: [name], MD   Reason for Endocrinology Evaluation: Hypertension      Date of Initial Endocrinology Evaluation: 04/20/2019     HPI: Yolanda Jennings is a 47 y.o. female with a past medical history of HTN. The patient presented for initial endocrinology clinic visit on 04/20/2019 for consultative assistance with her Hypertension    Pt has been diagnosed with HTN ~ 5 yrs ago.   She checks BP at home on 120/80 mm/dL    Denies panic attacks, has cold intolerance.   Has lost weight recently ( intentional )  Denies palpitations  No diarrhea or constipation    NO abdominal striae No weakness or depression   LMP 2 weeks ago, regular.   Mother with HTN   HISTORY:  Past Medical History:  Past Medical History:  Diagnosis Date  . Allergy   . Hypertension   . Sickle cell trait ([place])   . Tobacco abuse    Stopped smoking 08/24/13    Past Surgical History:  Past Surgical History:  Procedure Laterality Date  . BREAST BIOPSY Left 2011  . HERNIA REPAIR    . TUBAL LIGATION     2003      Social History:  reports that she quit smoking about 5 years ago. She quit after 20.00 years of use. She has never used smokeless tobacco. She reports current alcohol use. She reports that she does not use drugs.  Family History: family history includes Alcohol abuse in her father; Cancer in her mother; Hypertension in her brother and mother.   HOME MEDICATIONS: Allergies as of 04/20/2019      Reactions   Ace Inhibitors Swelling, Other (See Comments)   Reaction:  Tongue swelling    Lisinopril Swelling, Other (See Comments)   Reaction:  Tongue swelling       Medication List       Accurate as of April 20, 2019 12:07 PM. If you have any questions, ask your nurse or doctor.        amLODipine 10 MG tablet Commonly known as: NORVASC Take 1 tablet (10 mg total) by mouth  daily.   carvedilol 6.25 MG tablet Commonly known as: COREG TAKE 1 TABLET BY MOUTH TWICE DAILY WITH A MEAL   chlorthalidone 25 MG tablet Commonly known as: HYGROTON Take 1 tablet (25 mg total) by mouth daily.   cloNIDine 0.1 MG tablet Commonly known as: CATAPRES Take 1 tablet (0.1 mg total) by mouth 3 (three) times daily.   potassium chloride SA 20 MEQ tablet Commonly known as: KLOR-CON Take 1 tablet (20 mEq total) by mouth daily. 2 tabs initially, then 1 po qd.         REVIEW OF SYSTEMS: A comprehensive ROS was conducted with the patient and is negative except as per HPI and below:  ROS     OBJECTIVE:  VS: There were no vitals taken for this visit.   Wt Readings from Last 3 Encounters:  02/04/19 171 lb 9.6 oz (77.8 kg)  01/14/19 169 lb (76.7 kg)  08/31/18 176 lb (79.8 kg)     EXAM: General: Pt appears well and is in NAD  Hydration: Well-hydrated with moist mucous membranes and good skin turgor  Eyes: External eye exam normal without stare, lid lag or exophthalmos.  EOM intact.  Ears, Nose, Throat: Hearing: Grossly intact bilaterally Dental:  Good dentition  Throat: Clear without mass, erythema or exudate  Neck: General: Supple without adenopathy. Thyroid: Thyroid size normal.  No goiter or nodules appreciated. No thyroid bruit.  Lungs: Clear with good BS bilat with no rales, rhonchi, or wheezes  Heart: Auscultation: RRR.  Abdomen: Normoactive bowel sounds, soft, nontender, without masses or organomegaly palpable  Extremities:  BL LE: No pretibial edema normal ROM and strength.  Skin: Hair: Texture and amount normal with gender appropriate distribution Skin Inspection: No rashes Skin Palpation: Skin temperature, texture, and thickness normal to palpation  Neuro: Cranial nerves: II - XII grossly intact  Cerebellar: Normal coordination and movement; no tremor Motor: Normal strength throughout DTRs: 2+ and symmetric in UE without delay in relaxation phase   Mental Status: Judgment, insight: Intact Orientation: Oriented to time, place, and person Mood and affect: No depression, anxiety, or agitation     DATA REVIEWED:     ASSESSMENT/PLAN/RECOMMENDATIONS:   1. Hypertension :   - She is asymptomatic today - BP seems to be well controlled at home.  - Due to recurrent hypokalemia there was a concern about mineralocorticoid effect. Aldo: renin ratio was normal at 3.5 . Causes of renin elevation is due to renal hypoperfusion which is due to diuretic effect and low salt intake. IN hyperaldosteronism the renin would be suppressed.   - Unable to recheck aldo: renin today due to hypokalemia , will have to correct this first  - Will also proceed with dexamethesone suppression test to r/o excess cortisol secretion . - No clinical suspicion for pheochromocytoma.    Medications:  Stop chlorthalidone  Continue Kcl 40 mEq/L daily  Increase Carvedilol 6.25 mg to 2 tablets BID  Continue Amlodipine 10 mg daily   F/U PRN pending results.    Addendum: Discussed low Kcl and new adjustments over the phone on 10/12 @ 1650 . Pt expressed understanding     Signed electronically by: Mack Guise, MD  [facility] Endocrinology  [facility] [address] [facility] [address] [place], [place] [address] Phone: [phone] FAX: [phone]   CC: [name], MD [address] [place] [id number] Phone: [phone] Fax: [phone]   Return to Endocrinology clinic as below: Future Appointments  Date Time Provider [place]  04/20/2019  1:20 PM , Melanie Crazier, MD [facility] None  05/08/2019  3:40 PM [name], MD [facility]

## 2019-04-20 NOTE — Patient Instructions (Signed)
   Instructions for Dexamethasone Suppression Test   Step 1: Choose a morning when you can come to our lab at 8:00 am for a blood draw.   Step 2: On the night before the blood draw, take one 1 mg tablet of dexamethasone at 11:30 pm.  The timing is VERY important!   Step 3: The next morning, go to the lab for blood work at 8:00 am.  You do not have to be on an empty stomach, but the timing is VERY important!  

## 2019-04-23 ENCOUNTER — Telehealth: Payer: Self-pay | Admitting: Internal Medicine

## 2019-04-23 NOTE — Telephone Encounter (Signed)
-----   Message from Cloyd Stagers, MD sent at 04/20/2019  4:53 PM EDT ----- Otilio Connors,    This pt was checked out today. On her check out notes, I wrote for her to have labs today ( which she did ) but I also wrote for her to be scheduled for a morning lab at 8 AM another day.   When I look that up, I don't see where she had that lab appointment scheduled ? Am I missing something ?    Thank you

## 2019-04-23 NOTE — Telephone Encounter (Signed)
When at check out Patient stated she would call later to schedule 8 a.m. lab appointment. LMTCB to schedule 8 a.m. lab appt.

## 2019-04-27 ENCOUNTER — Other Ambulatory Visit (INDEPENDENT_AMBULATORY_CARE_PROVIDER_SITE_OTHER): Payer: Managed Care, Other (non HMO)

## 2019-04-27 ENCOUNTER — Other Ambulatory Visit: Payer: Self-pay

## 2019-04-27 DIAGNOSIS — E876 Hypokalemia: Secondary | ICD-10-CM

## 2019-04-27 DIAGNOSIS — I10 Essential (primary) hypertension: Secondary | ICD-10-CM

## 2019-04-27 LAB — POTASSIUM: Potassium: 4.5 mEq/L (ref 3.5–5.1)

## 2019-04-27 LAB — CORTISOL: Cortisol, Plasma: 1.1 ug/dL

## 2019-04-30 ENCOUNTER — Telehealth: Payer: Self-pay

## 2019-04-30 DIAGNOSIS — I10 Essential (primary) hypertension: Secondary | ICD-10-CM

## 2019-04-30 MED ORDER — CARVEDILOL 6.25 MG PO TABS: 12.5000 mg | ORAL_TABLET | Freq: Two times a day (BID) | ORAL | 1 refills | Status: DC

## 2019-04-30 NOTE — Telephone Encounter (Signed)
RX has been sent.

## 2019-04-30 NOTE — Telephone Encounter (Signed)
Patient called in needing a refill on medication carvedilol (COREG) 6.25 MG tablet  States the dosage was moved up now she's  almost out    Please advise

## 2019-05-08 ENCOUNTER — Ambulatory Visit: Payer: Managed Care, Other (non HMO) | Admitting: Family Medicine

## 2019-05-11 ENCOUNTER — Encounter: Payer: Self-pay | Admitting: Internal Medicine

## 2019-05-18 ENCOUNTER — Encounter: Payer: Self-pay | Admitting: Family Medicine

## 2019-05-18 ENCOUNTER — Ambulatory Visit: Payer: 59 | Admitting: Family Medicine

## 2019-05-18 ENCOUNTER — Other Ambulatory Visit: Payer: Self-pay

## 2019-05-18 VITALS — BP 116/74 | HR 75 | Temp 98.6°F | Wt 177.0 lb

## 2019-05-18 DIAGNOSIS — L52 Erythema nodosum: Secondary | ICD-10-CM | POA: Diagnosis not present

## 2019-05-18 DIAGNOSIS — I10 Essential (primary) hypertension: Secondary | ICD-10-CM | POA: Diagnosis not present

## 2019-05-18 DIAGNOSIS — E876 Hypokalemia: Secondary | ICD-10-CM

## 2019-05-18 NOTE — Patient Instructions (Addendum)
  Return for lab only visit to check for possible causes of erythema nodosum which is a rash that may present on the legs.  Chest x-ray was ordered at [place] on [facility] but can be performed at your convenience.  Follow-up to be determined after these results.  If any acute worsening symptoms return for recheck sooner.  Thank you for coming in today.     If you have lab work done today you will be contacted with your lab results within the next 2 weeks.  If you have not heard from [place] then please contact us. The fastest way to get your results is to register for My Chart.   IF you received an x-ray today, you will receive an invoice from [facility] Radiology. Please contact [facility] Radiology at [phone] with questions or concerns regarding your invoice.   IF you received labwork today, you will receive an invoice from [place]. Please contact [facility] at [phone] with questions or concerns regarding your invoice.   Our billing staff will not be able to assist you with questions regarding bills from these companies.  You will be contacted with the lab results as soon as they are available. The fastest way to get your results is to activate your My Chart account. Instructions are located on the last page of this paperwork. If you have not heard from [place] regarding the results in 2 weeks, please contact this office.

## 2019-05-18 NOTE — Progress Notes (Signed)
Subjective:  Patient ID: Yolanda Jennings, female    DOB: [birth year]  Age: 48 y.o. MRN: [id number]  CC:  Chief Complaint  Patient presents with  . Medical Management of Chronic Issues    3 month f/u on bp and potassium    HPI Yolanda Jennings presents for   Hypertension, with hypokalemia.  BP Readings from Last 3 Encounters:  05/18/19 116/74  04/20/19 (!) 160/88  02/04/19 129/79   Lab Results  Component Value Date   CREATININE 1.02 (H) 02/23/2019   Referred to endocrinology, office visit October 12, given persistent hypertension, hypokalemia, and slightly elevated renin level on August 17. Dexamethasone suppression test ordered, planned when potassium corrected.  Chlorthalidone stopped, continue KCl 40 mEq daily. Increase carvedilol 6.25 mg 2 tablets twice daily, continued amlodipine 10 mg daily, clonidine 0.1mg  BID.   Renin and aldosterone were normal on October 19.  Potassium normal at 4.5 on October 19.  Home readings - 120/80, 116/71.   Bruising on inside of left leg See prior visit. Initially in lower legs bilaterally. Seen in ER in February. No blood thinners. NKI. No new meds prior to rash.  Notes in upper R thigh since yesterday. TTP. Last a week or two then resolve. Upper one sore, outer one on thigh not sore. Ones on lower legs not sore.  No calf pain.  No upper body rash.  No hx of tb/sarcoid.  No tick bites.  No strep throat or recent throat infection.   Lab Results  Component Value Date   WBC 4.9 01/14/2019   HGB 13.6 01/14/2019   HCT 40.6 01/14/2019   MCV 79 01/14/2019   PLT 376 01/14/2019    History Patient Active Problem List   Diagnosis Date Noted  . Obesity (BMI 30.0-34.9) 08/22/2014  . Microcytic anemia 09/03/2013  . Headache 09/03/2013  . HTN (hypertension) 08/28/2013   Past Medical History:  Diagnosis Date  . Allergy   . Hypertension   . Sickle cell trait ([place])   . Tobacco abuse    Stopped smoking 08/24/13   Past Surgical  History:  Procedure Laterality Date  . BREAST BIOPSY Left 2011  . HERNIA REPAIR    . TUBAL LIGATION     2003   Allergies  Allergen Reactions  . Ace Inhibitors Swelling and Other (See Comments)    Reaction:  Tongue swelling   . Lisinopril Swelling and Other (See Comments)    Reaction:  Tongue swelling    Prior to Admission medications   Medication Sig Start Date End Date Taking? Authorizing Provider  amLODipine (NORVASC) 10 MG tablet Take 1 tablet (10 mg total) by mouth daily. 01/14/19  Yes [name], MD  carvedilol (COREG) 6.25 MG tablet Take 2 tablets (12.5 mg total) by mouth 2 (two) times daily with a meal. TAKE 1 TABLET BY MOUTH TWICE DAILY WITH A MEAL 04/30/19  Yes Elayne Snare, MD  cloNIDine (CATAPRES) 0.1 MG tablet Take 1 tablet (0.1 mg total) by mouth 3 (three) times daily. Patient taking differently: Take 0.1 mg by mouth 2 (two) times daily.  01/14/19  Yes [name], MD  potassium chloride SA (K-DUR) 20 MEQ tablet Take 1 tablet (20 mEq total) by mouth daily. 2 tabs initially, then 1 po qd. Patient taking differently: Take 40 mEq by mouth daily. 2 tabs PO QD 02/10/19  Yes [name], MD   Social History   Socioeconomic History  . Marital status: Single    Spouse  name: Not on file  . Number of children: Not on file  . Years of education: Not on file  . Highest education level: Not on file  Occupational History  . Occupation: Freight forwarder  Social Needs  . Financial resource strain: Not on file  . Food insecurity    Worry: Not on file    Inability: Not on file  . Transportation needs    Medical: Not on file    Non-medical: Not on file  Tobacco Use  . Smoking status: Former Smoker    Years: 20.00    Quit date: 08/24/2013    Years since quitting: 5.7  . Smokeless tobacco: Never Used  Substance and Sexual Activity  . Alcohol use: Yes    Alcohol/week: 0.0 standard drinks    Comment: occasional  . Drug use: No  . Sexual activity: Yes  Lifestyle   . Physical activity    Days per week: Not on file    Minutes per session: Not on file  . Stress: Not on file  Relationships  . Social Herbalist on phone: Not on file    Gets together: Not on file    Attends religious service: Not on file    Active member of club or organization: Not on file    Attends meetings of clubs or organizations: Not on file    Relationship status: Not on file  . Intimate partner violence    Fear of current or ex partner: Not on file    Emotionally abused: Not on file    Physically abused: Not on file    Forced sexual activity: Not on file  Other Topics Concern  . Not on file  Social History Narrative   Marital status: Single      Children: 2 children; no grandchildren.      Lives: with two children.      Employment: Freight forwarder      Tobacco: quit 08/2013.       Alcohol:  Socially.      Exercise: Yes      Education: [employer].    Review of Systems  Constitutional: Negative for fatigue and unexpected weight change.  Respiratory: Negative for chest tightness and shortness of breath.   Cardiovascular: Negative for chest pain, palpitations and leg swelling.  Gastrointestinal: Negative for abdominal pain and blood in stool.  Neurological: Negative for dizziness, syncope, light-headedness and headaches.    Objective:   Vitals:   05/18/19 1644  BP: 116/74  Pulse: 75  Temp: 98.6 F (37 C)  TempSrc: Oral  SpO2: 98%  Weight: 177 lb (80.3 kg)    Physical Exam Vitals signs reviewed.  Constitutional:      Appearance: She is well-developed.  HENT:     Head: Normocephalic and atraumatic.  Eyes:     Conjunctiva/sclera: Conjunctivae normal.     Pupils: Pupils are equal, round, and reactive to light.  Neck:     Vascular: No carotid bruit.  Cardiovascular:     Rate and Rhythm: Normal rate and regular rhythm.     Heart sounds: Normal heart sounds.  Pulmonary:     Effort: Pulmonary effort is normal.     Breath sounds: Normal  breath sounds.  Abdominal:     Palpations: Abdomen is soft. There is no pulsatile mass.     Tenderness: There is no abdominal tenderness.  Skin:    General: Skin is warm and dry.       Neurological:  Mental Status: She is alert and oriented to person, place, and time.  Psychiatric:        Behavior: Behavior normal.        Assessment & Plan:  Maryagnes Kettel is a 48 y.o. female . Erythema nodosum - Plan: QuantiFERON-TB Gold Plus, Sedimentation Rate, CBC, DG Chest 2 View  -Intermittent lower extremity lesions, but now persistent for some time.  Differential includes erythema nodosum.  Check QuantiFERON, sed rate, CBC, chest x-ray.  Follow-up to be determined by results.  Hypertension, unspecified type Hypokalemia  -Improved.  Followed by endocrinology for work-up of prior hypokalemia as above.  No changes for now  No orders of the defined types were placed in this encounter.  Patient Instructions    Return for lab only visit to check for possible causes of erythema nodosum which is a rash that may present on the legs.  Chest x-ray was ordered at [facility] but can be performed at your convenience.  Follow-up to be determined after these results.  If any acute worsening symptoms return for recheck sooner.  Thank you for coming in today.     If you have lab work done today you will be contacted with your lab results within the next 2 weeks.  If you have not heard from [place] then please contact us. The fastest way to get your results is to register for My Chart.   IF you received an x-ray today, you will receive an invoice from [facility]. Please contact [facility] at [phone] with questions or concerns regarding your invoice.   IF you received labwork today, you will receive an invoice from [facility]. Please contact [facility] at [phone] with questions or concerns regarding your invoice.   Our billing staff will not be  able to assist you with questions regarding bills from these companies.  You will be contacted with the lab results as soon as they are available. The fastest way to get your results is to activate your My Chart account. Instructions are located on the last page of this paperwork. If you have not heard from [place] regarding the results in 2 weeks, please contact this office.          Signed, [name], MD [facility]

## 2019-05-23 ENCOUNTER — Encounter: Payer: Self-pay | Admitting: Family Medicine

## 2019-06-12 ENCOUNTER — Other Ambulatory Visit: Payer: Self-pay | Admitting: Physician Assistant

## 2019-06-12 DIAGNOSIS — E876 Hypokalemia: Secondary | ICD-10-CM

## 2019-07-23 ENCOUNTER — Other Ambulatory Visit: Payer: Self-pay | Admitting: Family Medicine

## 2019-07-23 DIAGNOSIS — I10 Essential (primary) hypertension: Secondary | ICD-10-CM

## 2019-07-29 ENCOUNTER — Other Ambulatory Visit: Payer: Self-pay | Admitting: Family Medicine

## 2019-07-29 DIAGNOSIS — E876 Hypokalemia: Secondary | ICD-10-CM

## 2019-07-29 NOTE — Telephone Encounter (Signed)
Forwarding medication refill request to the clinical pool for review. 

## 2019-09-12 ENCOUNTER — Other Ambulatory Visit: Payer: Self-pay | Admitting: Family Medicine

## 2019-09-12 ENCOUNTER — Other Ambulatory Visit: Payer: Self-pay

## 2019-09-12 DIAGNOSIS — I10 Essential (primary) hypertension: Secondary | ICD-10-CM

## 2019-09-12 MED ORDER — CLONIDINE HCL 0.1 MG PO TABS: 0.1000 mg | ORAL_TABLET | Freq: Three times a day (TID) | ORAL | 0 refills | Status: DC

## 2019-09-12 NOTE — Telephone Encounter (Signed)
Requested Prescriptions  Pending Prescriptions Disp Refills  . cloNIDine (CATAPRES) 0.1 MG tablet [Pharmacy Med Name: cloNIDine HCl 0.1 MG Oral Tablet] 270 tablet 0    Sig: TAKE 1 TABLET BY MOUTH THREE TIMES DAILY     Cardiovascular:  Alpha-2 Agonists Passed - 09/12/2019 10:19 AM      Passed - Last BP in normal range    BP Readings from Last 1 Encounters:  05/18/19 116/74         Passed - Last Heart Rate in normal range    Pulse Readings from Last 1 Encounters:  05/18/19 75         Passed - Valid encounter within last 6 months    Recent Outpatient Visits          3 months ago Erythema nodosum   [facility], Ranell Patrick, MD   7 months ago Hypokalemia   [facility], Ranell Patrick, MD   8 months ago Annual physical exam   [facility], Ranell Patrick, MD   1 year ago Essential hypertension   [facility], Ranell Patrick, MD   1 year ago Well-controlled hypertension   [facility] [facility], PA-C

## 2019-09-13 ENCOUNTER — Other Ambulatory Visit: Payer: Self-pay | Admitting: *Deleted

## 2019-09-13 DIAGNOSIS — I10 Essential (primary) hypertension: Secondary | ICD-10-CM

## 2019-09-13 MED ORDER — CLONIDINE HCL 0.1 MG PO TABS: 0.1000 mg | ORAL_TABLET | Freq: Three times a day (TID) | ORAL | 0 refills | Status: DC

## 2019-09-23 LAB — BASIC METABOLIC PANEL
BUN/Creatinine Ratio: 11 (ref 9–23)
BUN: 11 mg/dL (ref 6–24)
CO2: 23 mmol/L (ref 20–29)
Calcium: 9.9 mg/dL (ref 8.7–10.2)
GFR calc Af Amer: 75 mL/min/{1.73_m2} (ref >59)
GFR calc non Af Amer: 65 mL/min/{1.73_m2} (ref >59)
Glucose: 99 mg/dL (ref 65–99)
Potassium: 4 mmol/L (ref 3.5–5.2)
Sodium: 137 mmol/L (ref 134–144)

## 2019-09-23 LAB — ALDOSTERONE + RENIN ACTIVITY W/O RATIO
ALDOSTERONE: 10.4 ng/dL (ref 0.0–30.0)
Renin: 1.47 ng/mL/hr (ref 0.167–5.380)

## 2019-09-23 LAB — ALDOSTERONE + RENIN ACTIVITY W/ RATIO
ALDOS/RENIN RATIO: 6 (ref 0.0–30.0)
ALDOSTERONE: 24.3 ng/dL (ref 0.0–30.0)
Renin: 4.043 ng/mL/hr (ref 0.167–5.380)

## 2019-10-17 ENCOUNTER — Other Ambulatory Visit: Payer: Self-pay | Admitting: Family Medicine

## 2019-10-17 DIAGNOSIS — I10 Essential (primary) hypertension: Secondary | ICD-10-CM

## 2019-11-03 ENCOUNTER — Other Ambulatory Visit: Payer: Self-pay | Admitting: Endocrinology

## 2019-11-03 DIAGNOSIS — I10 Essential (primary) hypertension: Secondary | ICD-10-CM

## 2019-11-03 NOTE — Telephone Encounter (Signed)
Refill or send to PCP? 

## 2019-11-03 NOTE — Telephone Encounter (Signed)
Forwarding to Dr. Maretta Bees

## 2020-01-02 ENCOUNTER — Other Ambulatory Visit: Payer: Self-pay | Admitting: Family Medicine

## 2020-01-02 DIAGNOSIS — E876 Hypokalemia: Secondary | ICD-10-CM

## 2020-01-02 NOTE — Telephone Encounter (Signed)
Requested medication (s) are due for refill today: yes  Requested medication (s) are on the active medication list: yes  Last refill:  07/30/19  Future visit scheduled: no  Notes to clinic:  Not sure if endocrinology is following potassium. Last pre criber was Dr Carlota Raspberry.   Requested Prescriptions  Pending Prescriptions Disp Refills   potassium chloride SA (KLOR-CON) 20 MEQ tablet [Pharmacy Med Name: Potassium Chloride Crys ER 20 MEQ Oral Tablet Extended Release] 180 tablet 0    Sig: Take 2 tablets by mouth once daily      Endocrinology:  Minerals - Potassium Supplementation Failed - 01/02/2020  5:11 PM      Failed - Cr in normal range and within 360 days    Creat  Date Value Ref Range Status  04/16/2016 1.15 (H) 0.50 - 1.10 mg/dL Final   Creatinine, Ser  Date Value Ref Range Status  02/23/2019 1.02 (H) 0.57 - 1.00 mg/dL Final          Passed - K in normal range and within 360 days    Potassium  Date Value Ref Range Status  04/27/2019 4.5 3.5 - 5.1 mEq/L Final          Passed - Valid encounter within last 12 months    Recent Outpatient Visits           7 months ago Erythema nodosum   [facility], Ranell Patrick, MD   11 months ago Hypokalemia   [facility], Ranell Patrick, MD   11 months ago Annual physical exam   [facility], Ranell Patrick, MD   1 year ago Essential hypertension   [facility], Ranell Patrick, MD   2 years ago Well-controlled hypertension   [facility], PA-C

## 2020-01-04 ENCOUNTER — Other Ambulatory Visit: Payer: Self-pay | Admitting: Emergency Medicine

## 2020-01-04 NOTE — Telephone Encounter (Signed)
Spoke with pt and scheduled appt in july

## 2020-01-04 NOTE — Telephone Encounter (Signed)
No further refills without office visit 

## 2020-01-04 NOTE — Telephone Encounter (Signed)
Please schedule patient an appt to be seen. We need to see updated lab results so we can adjust medication if needed. Thirty day supply has been sent to the pharmacy as a courtesy.

## 2020-01-19 ENCOUNTER — Other Ambulatory Visit: Payer: Self-pay | Admitting: Family Medicine

## 2020-01-19 DIAGNOSIS — I10 Essential (primary) hypertension: Secondary | ICD-10-CM

## 2020-01-20 ENCOUNTER — Telehealth: Payer: Self-pay | Admitting: Family Medicine

## 2020-01-20 DIAGNOSIS — I10 Essential (primary) hypertension: Secondary | ICD-10-CM

## 2020-01-20 MED ORDER — CLONIDINE HCL 0.1 MG PO TABS: 0.1000 mg | ORAL_TABLET | Freq: Three times a day (TID) | ORAL | 0 refills | Status: DC

## 2020-01-20 MED ORDER — CARVEDILOL 6.25 MG PO TABS: 12.5000 mg | ORAL_TABLET | Freq: Two times a day (BID) | ORAL | 0 refills | Status: DC

## 2020-01-20 NOTE — Telephone Encounter (Signed)
What is the name of the medication? carvedilol (COREG) 6.25 MG tablet [[id number]], cloNIDine (CATAPRES) 0.1 MG tablet [[id number]] and amLODipine (NORVASC) 10 MG tablet [[id number]    Have you contacted your pharmacy to request a refill? We have rescheduled her med refill app from 7/23 to 7/28. She would like a enough pills to last her until her 28th app.  Which pharmacy would you like this sent to? Pharmacy  [facility], [address]  [place], [place] [address]  Phone:  [phone] Fax:  [phone]       Patient notified that their request is being sent to the clinical staff for review and that they should receive a call once it is complete. If they do not receive a call within 72 hours they can check with their pharmacy or our office.

## 2020-01-20 NOTE — Telephone Encounter (Signed)
Norvasc has already been filled and the other two meds have been filled for 2 weeks. This will be enough so pt can come to her office visit.

## 2020-01-29 ENCOUNTER — Ambulatory Visit: Payer: 59 | Admitting: Family Medicine

## 2020-01-30 ENCOUNTER — Other Ambulatory Visit: Payer: Self-pay | Admitting: Family Medicine

## 2020-01-30 DIAGNOSIS — I10 Essential (primary) hypertension: Secondary | ICD-10-CM

## 2020-01-30 NOTE — Telephone Encounter (Signed)
Requested Prescriptions  Pending Prescriptions Disp Refills  . cloNIDine (CATAPRES) 0.1 MG tablet [Pharmacy Med Name: CLONIDINE 0.1MG      TAB] 270 tablet 0    Sig: TAKE 1 TABLET BY MOUTH THREE TIMES DAILY     Cardiovascular:  Alpha-2 Agonists Failed - 01/30/2020 11:19 AM      Failed - Valid encounter within last 6 months    Recent Outpatient Visits          8 months ago Erythema nodosum   [facility], Yolanda Patrick, MD   12 months ago Hypokalemia   [facility], Yolanda Patrick, MD   1 year ago Annual physical exam   [facility], Yolanda Patrick, MD   1 year ago Essential hypertension   [facility], Yolanda Patrick, MD   2 years ago Well-controlled hypertension   [facility], Yolanda Lia, PA-C      Future Appointments            In 4 days Yolanda Agreste, MD [facility] [place], [place] BP in normal range    BP Readings from Last 1 Encounters:  05/18/19 116/74         Passed - Last Heart Rate in normal range    Pulse Readings from Last 1 Encounters:  05/18/19 75

## 2020-02-01 ENCOUNTER — Other Ambulatory Visit: Payer: Self-pay | Admitting: Family Medicine

## 2020-02-01 DIAGNOSIS — I10 Essential (primary) hypertension: Secondary | ICD-10-CM

## 2020-02-01 NOTE — Telephone Encounter (Signed)
Medication Refill - Medication: carvedilol  Has the patient contacted their pharmacy? No. Pt states that she will be out of this medication before her appt. Please advise.  (Agent: If no, request that the patient contact the pharmacy for the refill.) (Agent: If yes, when and what did the pharmacy advise?)  Preferred Pharmacy (with phone number or street name):  [place], [place] [address]  Phone: [phone] Fax: [phone]  Hours: Not open 24 hours     Agent: Please be advised that RX refills may take up to 3 business days. We ask that you follow-up with your pharmacy.

## 2020-02-01 NOTE — Telephone Encounter (Signed)
Requested medication (s) are due for refill today: no  Requested medication (s) are on the active medication list: yes  Last refill:  01/20/2020  Future visit scheduled:  yes  Notes to clinic:  Patient has a upcoming appointment    Requested Prescriptions  Pending Prescriptions Disp Refills   carvedilol (COREG) 6.25 MG tablet 54 tablet 0    Sig: Take 2 tablets (12.5 mg total) by mouth 2 (two) times daily with a meal.      Cardiovascular:  Beta Blockers Failed - 02/01/2020  1:56 PM      Failed - Valid encounter within last 6 months    Recent Outpatient Visits           8 months ago Erythema nodosum   [facility], Ranell Patrick, MD   12 months ago Hypokalemia   [facility], Ranell Patrick, MD   1 year ago Annual physical exam   [facility], Ranell Patrick, MD   1 year ago Essential hypertension   [facility], Ranell Patrick, MD   2 years ago Well-controlled hypertension   [facility], Audrie Lia, PA-C       Future Appointments             In 2 days [name], MD [facility] [place], [place] BP in normal range    BP Readings from Last 1 Encounters:  05/18/19 116/74          Passed - Last Heart Rate in normal range    Pulse Readings from Last 1 Encounters:  05/18/19 75

## 2020-02-03 ENCOUNTER — Ambulatory Visit: Payer: 59 | Admitting: Family Medicine

## 2020-02-04 ENCOUNTER — Encounter: Payer: Self-pay | Admitting: Family Medicine

## 2020-02-05 ENCOUNTER — Telehealth (INDEPENDENT_AMBULATORY_CARE_PROVIDER_SITE_OTHER): Payer: 59 | Admitting: Family Medicine

## 2020-02-05 ENCOUNTER — Other Ambulatory Visit: Payer: Self-pay

## 2020-02-05 ENCOUNTER — Encounter: Payer: Self-pay | Admitting: Family Medicine

## 2020-02-05 ENCOUNTER — Ambulatory Visit (INDEPENDENT_AMBULATORY_CARE_PROVIDER_SITE_OTHER): Payer: 59 | Admitting: Family Medicine

## 2020-02-05 DIAGNOSIS — L52 Erythema nodosum: Secondary | ICD-10-CM

## 2020-02-05 DIAGNOSIS — I10 Essential (primary) hypertension: Secondary | ICD-10-CM

## 2020-02-05 DIAGNOSIS — R7303 Prediabetes: Secondary | ICD-10-CM

## 2020-02-05 DIAGNOSIS — J3489 Other specified disorders of nose and nasal sinuses: Secondary | ICD-10-CM | POA: Diagnosis not present

## 2020-02-05 DIAGNOSIS — E876 Hypokalemia: Secondary | ICD-10-CM | POA: Diagnosis not present

## 2020-02-05 DIAGNOSIS — Z1211 Encounter for screening for malignant neoplasm of colon: Secondary | ICD-10-CM

## 2020-02-05 MED ORDER — CLONIDINE HCL 0.1 MG PO TABS: 0.1000 mg | ORAL_TABLET | Freq: Two times a day (BID) | ORAL | 1 refills | Status: DC

## 2020-02-05 MED ORDER — POTASSIUM CHLORIDE CRYS ER 20 MEQ PO TBCR: 40.0000 meq | EXTENDED_RELEASE_TABLET | Freq: Every day | ORAL | 2 refills | Status: DC

## 2020-02-05 MED ORDER — IPRATROPIUM BROMIDE 0.06 % NA SOLN: 1.0000 | Freq: Four times a day (QID) | NASAL | 5 refills | Status: DC

## 2020-02-05 MED ORDER — CARVEDILOL 12.5 MG PO TABS: 12.5000 mg | ORAL_TABLET | Freq: Two times a day (BID) | ORAL | 1 refills | Status: DC

## 2020-02-05 MED ORDER — AMLODIPINE BESYLATE 10 MG PO TABS: 10.0000 mg | ORAL_TABLET | Freq: Every day | ORAL | 1 refills | Status: DC

## 2020-02-05 NOTE — Patient Instructions (Addendum)
  Keep a record of your blood pressures outside of the office and bring them to the next office visit. No change in meds for now.  I will refer you to gastroenterology for colon cancer screening. Follow up in next few weeks in office so we can review labs, areas on legs, and repeat blood pressure.  Thanks for taking my call today.    If you have lab work done today you will be contacted with your lab results within the next 2 weeks.  If you have not heard from [place] then please contact us. The fastest way to get your results is to register for My Chart.   IF you received an x-ray today, you will receive an invoice from [facility]. Please contact [facility] at [phone] with questions or concerns regarding your invoice.   IF you received labwork today, you will receive an invoice from [facility]. Please contact [facility] at [phone] with questions or concerns regarding your invoice.   Our billing staff will not be able to assist you with questions regarding bills from these companies.  You will be contacted with the lab results as soon as they are available. The fastest way to get your results is to activate your My Chart account. Instructions are located on the last page of this paperwork. If you have not heard from [place] regarding the results in 2 weeks, please contact this office.
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
## 2020-02-09 ENCOUNTER — Encounter: Payer: Self-pay | Admitting: Radiology

## 2020-02-09 ENCOUNTER — Ambulatory Visit (HOSPITAL_COMMUNITY)
Admission: EM | Admit: 2020-02-09 | Discharge: 2020-02-09 | Disposition: A | Discharge status: Home / Self Care | Payer: Managed Care, Other (non HMO) | Attending: Family Medicine | Admitting: Family Medicine

## 2020-02-09 ENCOUNTER — Other Ambulatory Visit: Payer: Self-pay

## 2020-02-09 ENCOUNTER — Encounter (HOSPITAL_COMMUNITY): Payer: Self-pay

## 2020-02-09 DIAGNOSIS — Z20822 Contact with and (suspected) exposure to covid-19: Secondary | ICD-10-CM | POA: Diagnosis not present

## 2020-02-09 LAB — QUANTIFERON-TB GOLD PLUS
QuantiFERON Mitogen Value: >10 IU/mL
QuantiFERON Nil Value: 0 IU/mL
QuantiFERON TB1 Ag Value: 0 IU/mL
QuantiFERON TB2 Ag Value: 0 IU/mL
QuantiFERON-TB Gold Plus: NEGATIVE

## 2020-02-09 LAB — CMP14+EGFR
ALT: 9 IU/L (ref 0–32)
AST: 11 IU/L (ref 0–40)
Albumin/Globulin Ratio: 1.4 (ref 1.2–2.2)
Albumin: 4.2 g/dL (ref 3.8–4.8)
Alkaline Phosphatase: 104 IU/L (ref 48–121)
BUN/Creatinine Ratio: 7 — ABNORMAL LOW (ref 9–23)
BUN: 7 mg/dL (ref 6–24)
Bilirubin Total: 0.3 mg/dL (ref 0.0–1.2)
CO2: 23 mmol/L (ref 20–29)
Calcium: 9.3 mg/dL (ref 8.7–10.2)
Chloride: 106 mmol/L (ref 96–106)
Creatinine, Ser: 0.98 mg/dL (ref 0.57–1.00)
GFR calc Af Amer: 78 mL/min/{1.73_m2} (ref >59)
GFR calc non Af Amer: 67 mL/min/{1.73_m2} (ref >59)
Globulin, Total: 3 g/dL (ref 1.5–4.5)
Glucose: 106 mg/dL — ABNORMAL HIGH (ref 65–99)
Potassium: 4.3 mmol/L (ref 3.5–5.2)
Sodium: 140 mmol/L (ref 134–144)
Total Protein: 7.2 g/dL (ref 6.0–8.5)

## 2020-02-09 LAB — CBC
Hematocrit: 38.4 % (ref 34.0–46.6)
Hemoglobin: 12.6 g/dL (ref 11.1–15.9)
MCH: 26.8 pg (ref 26.6–33.0)
MCHC: 32.8 g/dL (ref 31.5–35.7)
MCV: 82 fL (ref 79–97)
Platelets: 333 10*3/uL (ref 150–450)
RBC: 4.7 x10E6/uL (ref 3.77–5.28)
RDW: 14.1 % (ref 11.7–15.4)
WBC: 6 10*3/uL (ref 3.4–10.8)

## 2020-02-09 LAB — LIPID PANEL
Chol/HDL Ratio: 2.7 ratio (ref 0.0–4.4)
Cholesterol, Total: 174 mg/dL (ref 100–199)
HDL: 64 mg/dL (ref >39)
LDL Chol Calc (NIH): 95 mg/dL (ref 0–99)
Triglycerides: 82 mg/dL (ref 0–149)
VLDL Cholesterol Cal: 15 mg/dL (ref 5–40)

## 2020-02-09 LAB — HEMOGLOBIN A1C
Est. average glucose Bld gHb Est-mCnc: 120 mg/dL
Hgb A1c MFr Bld: 5.8 % — ABNORMAL HIGH (ref 4.8–5.6)

## 2020-02-09 LAB — SEDIMENTATION RATE: Sed Rate: 11 mm/hr (ref 0–40)

## 2020-02-09 LAB — SARS CORONAVIRUS 2 (TAT 6-24 HRS): SARS Coronavirus 2: NEGATIVE

## 2020-02-09 LAB — SPECIMEN STATUS REPORT

## 2020-02-09 NOTE — Discharge Instructions (Signed)
Monitor MyChart for results

## 2020-02-09 NOTE — ED Provider Notes (Signed)
[place]    CSN: [id number] Arrival date & time: 02/09/20  6213      History   Chief Complaint Chief Complaint  Patient presents with  . Covid Exposure    HPI Yolanda Jennings is a 49 y.o. female history of hypertension presenting today for Covid testing after exposure. Patient's recently tested positive for Covid 2 days ago, developed symptoms 3 days ago. She herself denies any URI symptoms, GI symptoms. Denies fevers chills or body aches. Denies loss of taste smell or appetite.  HPI  Past Medical History:  Diagnosis Date  . Allergy   . Hypertension   . Sickle cell trait ([place])   . Tobacco abuse    Stopped smoking 08/24/13    Patient Active Problem List   Diagnosis Date Noted  . Obesity (BMI 30.0-34.9) 08/22/2014  . Microcytic anemia 09/03/2013  . Headache 09/03/2013  . HTN (hypertension) 08/28/2013    Past Surgical History:  Procedure Laterality Date  . BREAST BIOPSY Left 2011  . HERNIA REPAIR    . TUBAL LIGATION     2003    OB History    Gravida  2   Para  2   Term  2   Preterm      AB      Living  2     SAB      TAB      Ectopic      Multiple      Live Births  2            Home Medications    Prior to Admission medications   Medication Sig Start Date End Date Taking? Authorizing Provider  amLODipine (NORVASC) 10 MG tablet Take 1 tablet (10 mg total) by mouth daily. 02/05/20   [name], MD  carvedilol (COREG) 12.5 MG tablet Take 1 tablet (12.5 mg total) by mouth 2 (two) times daily with a meal. 02/05/20   [name], MD  cloNIDine (CATAPRES) 0.1 MG tablet Take 1 tablet (0.1 mg total) by mouth 2 (two) times daily. 02/05/20   [name], MD  ipratropium (ATROVENT) 0.06 % nasal spray Place 1-2 sprays into both nostrils 4 (four) times daily. As needed for nasal congestion 02/05/20   [name], MD  potassium chloride SA (KLOR-CON) 20 MEQ tablet Take 2 tablets (40 mEq total) by mouth daily.  02/05/20   [name], MD    Family History Family History  Problem Relation Age of Onset  . Cancer Mother        lung & cervical  . Hypertension Mother   . Alcohol abuse Father   . Hypertension Brother     Social History Social History   Tobacco Use  . Smoking status: Former Smoker    Years: 20.00    Quit date: 08/24/2013    Years since quitting: 6.4  . Smokeless tobacco: Never Used  Vaping Use  . Vaping Use: Never used  Substance Use Topics  . Alcohol use: Yes    Alcohol/week: 0.0 standard drinks    Comment: occasional  . Drug use: No     Allergies   Ace inhibitors and Lisinopril   Review of Systems Review of Systems  Constitutional: Negative for activity change, appetite change, chills, fatigue and fever.  HENT: Negative for congestion, ear pain, rhinorrhea, sinus pressure, sore throat and trouble swallowing.   Eyes: Negative for discharge and redness.  Respiratory: Negative for cough, chest tightness and  shortness of breath.   Cardiovascular: Negative for chest pain.  Gastrointestinal: Negative for abdominal pain, diarrhea, nausea and vomiting.  Musculoskeletal: Negative for myalgias.  Skin: Negative for rash.  Neurological: Negative for dizziness, light-headedness and headaches.     Physical Exam Triage Vital Signs ED Triage Vitals  Enc Vitals Group     BP 02/09/20 0917 (!) 147/81     Pulse Rate 02/09/20 0917 74     Resp 02/09/20 0917 18     Temp 02/09/20 0917 98.6 F (37 C)     Temp Source 02/09/20 0917 Oral     SpO2 02/09/20 0917 100 %     Weight 02/09/20 0927 183 lb 12.8 oz (83.4 kg)     Height --      Head Circumference --      Peak Flow --      Pain Score 02/09/20 0916 0     Pain Loc --      Pain Edu? --      Excl. in [place]? --    No data found.  Updated Vital Signs BP (!) 147/81 (BP Location: Left Arm)   Pulse 74   Temp 98.6 F (37 C) (Oral)   Resp 18   Wt 183 lb 12.8 oz (83.4 kg)   LMP 01/23/2020   SpO2 100%   BMI 30.59  kg/m   Visual Acuity Right Eye Distance:   Left Eye Distance:   Bilateral Distance:    Right Eye Near:   Left Eye Near:    Bilateral Near:     Physical Exam Vitals and nursing note reviewed.  Constitutional:      Appearance: She is well-developed.     Comments: No acute distress  HENT:     Head: Normocephalic and atraumatic.     Nose: Nose normal.  Eyes:     Conjunctiva/sclera: Conjunctivae normal.  Cardiovascular:     Rate and Rhythm: Normal rate.  Pulmonary:     Effort: Pulmonary effort is normal. No respiratory distress.  Abdominal:     General: There is no distension.  Musculoskeletal:        General: Normal range of motion.     Cervical back: Neck supple.  Skin:    General: Skin is warm and dry.  Neurological:     Mental Status: She is alert and oriented to person, place, and time.      UC Treatments / Results  Labs (all labs ordered are listed, but only abnormal results are displayed) Labs Reviewed  SARS CORONAVIRUS 2 (TAT 6-24 HRS)    EKG   Radiology No results found.  Procedures Procedures (including critical care time)  Medications Ordered in UC Medications - No data to display  Initial Impression / Assessment and Plan / UC Course  I have reviewed the triage vital signs and the nursing notes.  Pertinent labs & imaging results that were available during my care of the patient were reviewed by me and considered in my medical decision making (see chart for details).     Covid test pending after exposure. Currently asymptomatic. Recommending quarantine until results return. Follow-up for retesting if developing symptoms within the next 2 weeks after negative test.  Discussed strict return precautions. Patient verbalized understanding and is agreeable with plan.  Final Clinical Impressions(s) / UC Diagnoses   Final diagnoses:  Exposure to COVID-19 virus  Encounter for laboratory testing for COVID-19 virus     Discharge Instructions       Monitor  MyChart for results   ED Prescriptions    None     PDMP not reviewed this encounter.   Janith Lima, [place] 02/09/20 [phone]

## 2020-02-09 NOTE — ED Triage Notes (Signed)
Pt states she lives with her daughter and was informed Sunday that she tested POSITIVE for COVID, pt's last contact with her daughter was Saturday, daughter has been quarantining at the other end of their shared living space since Sunday. Pt is denying ALL symptoms today and wants COVID testing.

## 2020-03-28 ENCOUNTER — Encounter: Payer: Self-pay | Admitting: Gastroenterology

## 2020-03-30 ENCOUNTER — Encounter: Payer: Self-pay | Admitting: Family Medicine

## 2020-04-01 ENCOUNTER — Other Ambulatory Visit: Payer: Self-pay

## 2020-04-01 ENCOUNTER — Ambulatory Visit (HOSPITAL_COMMUNITY)
Admission: EM | Admit: 2020-04-01 | Discharge: 2020-04-01 | Disposition: A | Discharge status: Home / Self Care | Payer: Managed Care, Other (non HMO)

## 2020-04-01 ENCOUNTER — Encounter (HOSPITAL_COMMUNITY): Payer: Self-pay

## 2020-04-01 ENCOUNTER — Telehealth: Payer: Self-pay

## 2020-04-01 DIAGNOSIS — J0120 Acute ethmoidal sinusitis, unspecified: Secondary | ICD-10-CM

## 2020-04-01 MED ORDER — AMOXICILLIN-POT CLAVULANATE 875-125 MG PO TABS: 1.0000 | ORAL_TABLET | Freq: Two times a day (BID) | ORAL | 0 refills | Status: AC

## 2020-04-01 NOTE — ED Triage Notes (Signed)
Pt presents with nasal congestion and head pressure X 1 month with no relief with nasal spray.

## 2020-04-01 NOTE — ED Provider Notes (Addendum)
[place]    CSN: [id number] Arrival date & time: 04/01/20  1803      History   Chief Complaint Chief Complaint  Patient presents with  . Nasal Congestion  . Head Pressure    HPI Yolanda Collignon is a 49 y.o. female.   Patient presents for several weeks to over a month of nasal congestion.  She reports she has had chronic issues with nasal congestion over the last 1 to 2 months and is used multiple nasal sprays without any improvement.  She reports her last 4 days or so she has developed a lot of frontal pressure above her nose.  She has not had any relief.  This is caused headaches.  She believes it is also causing her blood pressure to run high.  She has not been on to have follow-up with her primary care due to scheduling.  Denies fever, chills.  Denies cough, sore throat.  Denies any dizziness.  No known sick contacts.  Receive Covid vaccines.  She has tried Flonase at home.  She has been prescribed Flovent nasal spray by her primary care provider over a month ago.  None of these have seemed to help much.     Past Medical History:  Diagnosis Date  . Allergy   . Hypertension   . Sickle cell trait ([place])   . Tobacco abuse    Stopped smoking 08/24/13    Patient Active Problem List   Diagnosis Date Noted  . Obesity (BMI 30.0-34.9) 08/22/2014  . Microcytic anemia 09/03/2013  . Headache 09/03/2013  . HTN (hypertension) 08/28/2013    Past Surgical History:  Procedure Laterality Date  . BREAST BIOPSY Left 2011  . HERNIA REPAIR    . TUBAL LIGATION     2003    OB History    Gravida  2   Para  2   Term  2   Preterm      AB      Living  2     SAB      TAB      Ectopic      Multiple      Live Births  2            Home Medications    Prior to Admission medications   Medication Sig Start Date End Date Taking? Authorizing Provider  amLODipine (NORVASC) 10 MG tablet Take 1 tablet (10 mg total) by mouth daily. 02/05/20   [name], MD  amoxicillin-clavulanate (AUGMENTIN) 875-125 MG tablet Take 1 tablet by mouth every 12 (twelve) hours for 10 days. 04/01/20 04/11/20  , Marguerita Beards, PA-C  carvedilol (COREG) 12.5 MG tablet Take 1 tablet (12.5 mg total) by mouth 2 (two) times daily with a meal. 02/05/20   [name], MD  cloNIDine (CATAPRES) 0.1 MG tablet Take 1 tablet (0.1 mg total) by mouth 2 (two) times daily. 02/05/20   [name], MD  ipratropium (ATROVENT) 0.06 % nasal spray Place 1-2 sprays into both nostrils 4 (four) times daily. As needed for nasal congestion 02/05/20   [name], MD  potassium chloride SA (KLOR-CON) 20 MEQ tablet Take 2 tablets (40 mEq total) by mouth daily. 02/05/20   [name], MD    Family History Family History  Problem Relation Age of Onset  . Cancer Mother        lung & cervical  . Hypertension Mother   . Alcohol abuse Father   .  Hypertension Brother     Social History Social History   Tobacco Use  . Smoking status: Former Smoker    Years: 20.00    Quit date: 08/24/2013    Years since quitting: 6.6  . Smokeless tobacco: Never Used  Vaping Use  . Vaping Use: Never used  Substance Use Topics  . Alcohol use: Yes    Alcohol/week: 0.0 standard drinks    Comment: occasional  . Drug use: No     Allergies   Ace inhibitors and Lisinopril   Review of Systems Review of Systems   Physical Exam Triage Vital Signs ED Triage Vitals  Enc Vitals Group     BP 04/01/20 1953 (!) 150/83     Pulse Rate 04/01/20 1953 84     Resp 04/01/20 1953 18     Temp 04/01/20 1953 97.7 F (36.5 C)     Temp Source 04/01/20 1953 Oral     SpO2 04/01/20 1953 98 %     Weight --      Height --      Head Circumference --      Peak Flow --      Pain Score 04/01/20 1957 5     Pain Loc --      Pain Edu? --      Excl. in [place]? --    No data found.  Updated Vital Signs BP (!) 150/83 (BP Location: Right Arm)   Pulse 84   Temp 97.7 F (36.5 C) (Oral)   Resp 18    SpO2 98%   Visual Acuity Right Eye Distance:   Left Eye Distance:   Bilateral Distance:    Right Eye Near:   Left Eye Near:    Bilateral Near:     Physical Exam Vitals and nursing note reviewed.  Constitutional:      General: She is not in acute distress.    Appearance: Normal appearance. She is well-developed. She is not ill-appearing.  HENT:     Head: Normocephalic and atraumatic.     Comments: Ethmoid all tenderness.    Right Ear: Tympanic membrane normal.     Left Ear: Tympanic membrane normal.     Nose: Congestion present.     Mouth/Throat:     Mouth: Mucous membranes are moist.  Eyes:     Conjunctiva/sclera: Conjunctivae normal.  Cardiovascular:     Rate and Rhythm: Normal rate and regular rhythm.     Heart sounds: No murmur heard.   Pulmonary:     Effort: Pulmonary effort is normal. No respiratory distress.     Breath sounds: Normal breath sounds.  Abdominal:     Palpations: Abdomen is soft.     Tenderness: There is no abdominal tenderness.  Musculoskeletal:     Cervical back: Neck supple.  Skin:    General: Skin is warm and dry.  Neurological:     General: No focal deficit present.     Mental Status: She is alert and oriented to person, place, and time.      UC Treatments / Results  Labs (all labs ordered are listed, but only abnormal results are displayed) Labs Reviewed - No data to display  EKG   Radiology No results found.  Procedures Procedures (including critical care time)  Medications Ordered in UC Medications - No data to display  Initial Impression / Assessment and Plan / UC Course  I have reviewed the triage vital signs and the nursing notes.  Pertinent labs & imaging  results that were available during my care of the patient were reviewed by me and considered in my medical decision making (see chart for details).     #Sinusitis Patient is a 49 year old presenting with what appears to be an ethmoidal sinusitis.  Given duration  symptoms with acute change in facial pain for last 4 days, will start on Augmentin.  We will have her continue her current nasal regimen.  We will have her follow-up with her primary care next week.  Patient verbalized agreement understanding plan of care Final Clinical Impressions(s) / UC Diagnoses   Final diagnoses:  Acute ethmoidal sinusitis, recurrence not specified     Discharge Instructions     Take the Augmentin as prescribed May use over-the-counter nasal saline sprays Continue your current nasal regimen  Follow-up with your primary care if not improving and follow-up on your blood pressure      ED Prescriptions    Medication Sig Dispense Auth. Provider   amoxicillin-clavulanate (AUGMENTIN) 875-125 MG tablet Take 1 tablet by mouth every 12 (twelve) hours for 10 days. 20 tablet , Marguerita Beards, PA-C     PDMP not reviewed this encounter.   Purnell Shoemaker, PA-C 04/01/20 2138    , Marguerita Beards, PA-C 04/01/20 2138

## 2020-04-01 NOTE — Telephone Encounter (Signed)
Pt. Called to return messages from Dr .Carlota Raspberry. Pt. Asserted she had not been taking medication (clonidine) as directed due to wanting to correct elevate BP. Pt also asserted that her sinus congestion has not gone away and she is still experiencing those symptoms

## 2020-04-01 NOTE — Discharge Instructions (Signed)
Take the Augmentin as prescribed May use over-the-counter nasal saline sprays Continue your current nasal regimen  Follow-up with your primary care if not improving and follow-up on your blood pressure

## 2020-04-02 ENCOUNTER — Encounter: Payer: Self-pay | Admitting: Family Medicine

## 2020-04-02 ENCOUNTER — Encounter: Payer: Self-pay | Admitting: Internal Medicine

## 2020-04-04 ENCOUNTER — Other Ambulatory Visit: Payer: Self-pay | Admitting: Internal Medicine

## 2020-04-04 DIAGNOSIS — I10 Essential (primary) hypertension: Secondary | ICD-10-CM

## 2020-04-04 MED ORDER — CARVEDILOL 12.5 MG PO TABS: 12.5000 mg | ORAL_TABLET | Freq: Two times a day (BID) | ORAL | 1 refills | Status: DC

## 2020-04-04 NOTE — Telephone Encounter (Signed)
Left a voice mail to call back to schedule that virtual visit. Please advise.

## 2020-04-05 ENCOUNTER — Emergency Department (HOSPITAL_COMMUNITY)
Admission: EM | Admit: 2020-04-05 | Discharge: 2020-04-06 | Disposition: A | Discharge status: Home / Self Care | Payer: Managed Care, Other (non HMO) | Attending: Emergency Medicine | Admitting: Emergency Medicine

## 2020-04-05 ENCOUNTER — Other Ambulatory Visit: Payer: Self-pay

## 2020-04-05 ENCOUNTER — Encounter (HOSPITAL_COMMUNITY): Payer: Self-pay | Admitting: Emergency Medicine

## 2020-04-05 DIAGNOSIS — I10 Essential (primary) hypertension: Secondary | ICD-10-CM | POA: Diagnosis not present

## 2020-04-05 DIAGNOSIS — R519 Headache, unspecified: Secondary | ICD-10-CM | POA: Diagnosis not present

## 2020-04-05 DIAGNOSIS — Z79899 Other long term (current) drug therapy: Secondary | ICD-10-CM | POA: Insufficient documentation

## 2020-04-05 DIAGNOSIS — Z87891 Personal history of nicotine dependence: Secondary | ICD-10-CM | POA: Diagnosis not present

## 2020-04-05 NOTE — ED Triage Notes (Signed)
Patient reports intermittent frontal headache for 1 1/2 weeks , denies head injury , no photophobia or fever , occasional nausea , denies blurred vision .

## 2020-04-06 MED ORDER — METOCLOPRAMIDE HCL 5 MG/ML IJ SOLN
10.0000 mg | Freq: Once | INTRAMUSCULAR | Status: AC
  Administered 2020-04-06: 10 mg via INTRAMUSCULAR
  Filled 2020-04-06: qty 2

## 2020-04-06 MED ORDER — KETOROLAC TROMETHAMINE 30 MG/ML IJ SOLN
30.0000 mg | Freq: Once | INTRAMUSCULAR | Status: AC
  Administered 2020-04-06: 30 mg via INTRAMUSCULAR
  Filled 2020-04-06: qty 1

## 2020-04-06 NOTE — ED Notes (Signed)
Patient verbalizes understanding of discharge instructions. Opportunity for questioning and answers were provided. Armband removed by staff, pt discharged from ED.  

## 2020-04-06 NOTE — ED Provider Notes (Signed)
[facility] EMERGENCY DEPARTMENT Provider Note   CSN: [id number] Arrival date & time: 04/05/20  1906     History Chief Complaint  Patient presents with  . Headache    Yolanda Jennings is a 49 y.o. female.  Patient c/o dull frontal/left frontal headache intermittently in past couple weeks. States symptoms gradual onset, dull to throbbing, comes and goes, without specific or consistently exacerbating or alleviating factors, moderate.  States similar headaches rarely in past as well. No acute/abrupt, 'thunderclap', or 'worst' type of headaches. States recent 'cold' symptoms with nasal congestion, occasional non prod cough - indicates was prescribed abx for possible sinusitis which she has taken, and indicates those earlier 'cold' symptoms have resolved. Denies known covid exposure. No fever. No head trauma or syncope. No eye pain or change in vision. No neck pain or stiffness. No change in speech. No numbness, weakness, or loss of normal functional ability. No change in headaches with head position, or time of day. Has not had medication today for headache.   The history is provided by the patient.  Headache Associated symptoms: no abdominal pain, no back pain, no cough, no eye pain, no fever, no nausea, no neck pain, no numbness, no vomiting and no weakness        Past Medical History:  Diagnosis Date  . Allergy   . Hypertension   . Sickle cell trait ([place])   . Tobacco abuse    Stopped smoking 08/24/13    Patient Active Problem List   Diagnosis Date Noted  . Obesity (BMI 30.0-34.9) 08/22/2014  . Microcytic anemia 09/03/2013  . Headache 09/03/2013  . HTN (hypertension) 08/28/2013    Past Surgical History:  Procedure Laterality Date  . BREAST BIOPSY Left 2011  . HERNIA REPAIR    . TUBAL LIGATION     2003     OB History    Gravida  2   Para  2   Term  2   Preterm      AB      Living  2     SAB      TAB      Ectopic      Multiple        Live Births  2           Family History  Problem Relation Age of Onset  . Cancer Mother        lung & cervical  . Hypertension Mother   . Alcohol abuse Father   . Hypertension Brother     Social History   Tobacco Use  . Smoking status: Former Smoker    Years: 20.00    Quit date: 08/24/2013    Years since quitting: 6.6  . Smokeless tobacco: Never Used  Vaping Use  . Vaping Use: Never used  Substance Use Topics  . Alcohol use: Yes    Alcohol/week: 0.0 standard drinks    Comment: occasional  . Drug use: No    Home Medications Prior to Admission medications   Medication Sig Start Date End Date Taking? Authorizing Provider  amLODipine (NORVASC) 10 MG tablet Take 1 tablet (10 mg total) by mouth daily. 02/05/20   [name], MD  amoxicillin-clavulanate (AUGMENTIN) 875-125 MG tablet Take 1 tablet by mouth every 12 (twelve) hours for 10 days. 04/01/20 04/11/20  Darr, Marguerita Beards, PA-C  carvedilol (COREG) 12.5 MG tablet Take 1 tablet (12.5 mg total) by mouth 2 (two) times daily with a meal. 04/04/20  Shamleffer, Melanie Crazier, MD  cloNIDine (CATAPRES) 0.1 MG tablet Take 1 tablet (0.1 mg total) by mouth 2 (two) times daily. 02/05/20   [name], MD  ipratropium (ATROVENT) 0.06 % nasal spray Place 1-2 sprays into both nostrils 4 (four) times daily. As needed for nasal congestion 02/05/20   [name], MD  potassium chloride SA (KLOR-CON) 20 MEQ tablet Take 2 tablets (40 mEq total) by mouth daily. 02/05/20   [name], MD    Allergies    Ace inhibitors and Lisinopril  Review of Systems   Review of Systems  Constitutional: Negative for chills and fever.  HENT: Negative for sinus pain.   Eyes: Negative for pain, redness and visual disturbance.  Respiratory: Negative for cough and shortness of breath.   Cardiovascular: Negative for chest pain.  Gastrointestinal: Negative for abdominal pain, nausea and vomiting.  Genitourinary: Negative for flank pain.   Musculoskeletal: Negative for back pain and neck pain.  Skin: Negative for rash.  Neurological: Positive for headaches. Negative for speech difficulty, weakness and numbness.  Hematological: Does not bruise/bleed easily.  Psychiatric/Behavioral: Negative for confusion.    Physical Exam Updated Vital Signs BP (!) 145/85   Pulse 76   Temp 98.2 F (36.8 C)   Resp 18   Ht 1.651 m (5\' 5" )   Wt 55 kg   LMP 02/23/2020   SpO2 100%   BMI 20.18 kg/m   Physical Exam Vitals and nursing note reviewed.  Constitutional:      Appearance: Normal appearance. She is well-developed.  HENT:     Head: Atraumatic.     Comments: No sinus, temporal, or mastoid tenderness.     Left Ear: Tympanic membrane normal.     Nose: Nose normal.     Mouth/Throat:     Mouth: Mucous membranes are moist.  Eyes:     General: No scleral icterus.    Conjunctiva/sclera: Conjunctivae normal.     Pupils: Pupils are equal, round, and reactive to light.  Neck:     Trachea: No tracheal deviation.  Cardiovascular:     Rate and Rhythm: Normal rate and regular rhythm.     Pulses: Normal pulses.     Heart sounds: Normal heart sounds. No murmur heard.  No friction rub. No gallop.   Pulmonary:     Effort: Pulmonary effort is normal. No respiratory distress.     Breath sounds: Normal breath sounds.  Abdominal:     General: Bowel sounds are normal. There is no distension.     Palpations: Abdomen is soft.     Tenderness: There is no abdominal tenderness. There is no guarding.  Genitourinary:    Comments: No cva tenderness.  Musculoskeletal:        General: No swelling.     Cervical back: Normal range of motion and neck supple. No rigidity. No muscular tenderness.  Skin:    General: Skin is warm and dry.     Findings: No rash.  Neurological:     Mental Status: She is alert.     Cranial Nerves: No cranial nerve deficit.     Comments: Alert, speech normal. No dysarthria or aphasia. Motor/sens grossly intact bil.  Steady gait.   Psychiatric:        Mood and Affect: Mood normal.     ED Results / Procedures / Treatments   Labs (all labs ordered are listed, but only abnormal results are displayed) Labs Reviewed - No data to display  EKG  None  Radiology No results found.  Procedures Procedures (including critical care time)  Medications Ordered in ED Medications  metoCLOPramide (REGLAN) injection 10 mg (10 mg Intramuscular Given 04/06/20 0847)  ketorolac (TORADOL) 30 MG/ML injection 30 mg (30 mg Intramuscular Given 04/06/20 0847)    ED Course  I have reviewed the triage vital signs and the nursing notes.  Pertinent labs & imaging results that were available during my care of the patient were reviewed by me and considered in my medical decision making (see chart for details).    MDM Rules/Calculators/A&P                         Pt drove self to ED.  Pt notes 'cold' symptoms earlier in month have completely resolved.  No fever/chills.   Reviewed nursing notes and prior charts for additional history.  Prior imaging (2105) for frontal headaches negative then.   Pt request medication shot for headaches.   Reglan IM. Toradol IM.   Pt is comfortably appearing. BP 114/68 currently. Hr 70s. rr 16.  Patient appears stable for d/c.   Rec pcp f/u.  Return precautions provided.    Final Clinical Impression(s) / ED Diagnoses Final diagnoses:  None    Rx / DC Orders ED Discharge Orders    None       Lajean Saver, MD 04/06/20 [phone]

## 2020-04-06 NOTE — Discharge Instructions (Addendum)
It was our pleasure to provide your ER care today - we hope that you feel better.  Rest. Drink plenty of fluids.  Try excedrin, or ibuprofen as need.   Follow up with primary care doctor in the next few days for recheck if symptoms fail to improve/resolve.  Also follow up with your doctor for recheck of blood pressure as it is mildly high today.  Return to ER if worse, new symptoms, fevers, worsening or severe pain, persistent vomiting, numbness/weakness, or other concern.

## 2020-04-21 LAB — HM MAMMOGRAPHY

## 2020-05-04 ENCOUNTER — Ambulatory Visit (INDEPENDENT_AMBULATORY_CARE_PROVIDER_SITE_OTHER): Payer: 59 | Admitting: Family Medicine

## 2020-05-04 ENCOUNTER — Encounter: Payer: Self-pay | Admitting: Family Medicine

## 2020-05-04 ENCOUNTER — Other Ambulatory Visit: Payer: Self-pay

## 2020-05-04 VITALS — BP 130/76 | HR 83 | Temp 98.7°F | Ht 65.0 in | Wt 180.0 lb

## 2020-05-04 DIAGNOSIS — G4452 New daily persistent headache (NDPH): Secondary | ICD-10-CM

## 2020-05-04 DIAGNOSIS — I10 Essential (primary) hypertension: Secondary | ICD-10-CM | POA: Diagnosis not present

## 2020-05-04 DIAGNOSIS — E876 Hypokalemia: Secondary | ICD-10-CM

## 2020-05-04 MED ORDER — CARVEDILOL 25 MG PO TABS: 25.0000 mg | ORAL_TABLET | Freq: Two times a day (BID) | ORAL | 2 refills | Status: DC

## 2020-05-04 MED ORDER — AMLODIPINE BESYLATE 10 MG PO TABS: 10.0000 mg | ORAL_TABLET | Freq: Every day | ORAL | 1 refills | Status: DC

## 2020-05-04 MED ORDER — POTASSIUM CHLORIDE CRYS ER 20 MEQ PO TBCR: 40.0000 meq | EXTENDED_RELEASE_TABLET | Freq: Every day | ORAL | 2 refills | Status: DC

## 2020-05-04 MED ORDER — CLONIDINE HCL 0.1 MG PO TABS: 0.1000 mg | ORAL_TABLET | Freq: Two times a day (BID) | ORAL | 1 refills | Status: DC
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
## 2020-05-04 NOTE — Patient Instructions (Addendum)
Slightly higher dose of carvedilol at 25 mg twice daily, but watch for lightheadedness or dizziness at that dose and if that occurs return to previous dose.  No other med changes at this time.  Tylenol is okay for headaches if needed, would like to try to decrease use of ibuprofen as that can cause rebound headaches.  See other information below.  I will check a CT scan although I expect that to be normal and refer you to neurology.  Recheck with me in 2 weeks.  Return to the clinic or go to the nearest emergency room if any of your symptoms worsen or new symptoms occur.   General Headache Without Cause A headache is pain or discomfort felt around the head or neck area. The specific cause of a headache may not be found. There are many causes and types of headaches. A few common ones are:  Tension headaches.  Migraine headaches.  Cluster headaches.  Chronic daily headaches. Follow these instructions at home: Watch your condition for any changes. Let your health care provider know about them. Take these steps to help with your condition: Managing pain      Take over-the-counter and prescription medicines only as told by your health care provider.  Lie down in a dark, quiet room when you have a headache.  If directed, put ice on your head and neck area: ? Put ice in a plastic bag. ? Place a towel between your skin and the bag. ? Leave the ice on for 20 minutes, 2-3 times per day.  If directed, apply heat to the affected area. Use the heat source that your health care provider recommends, such as a moist heat pack or a heating pad. ? Place a towel between your skin and the heat source. ? Leave the heat on for 20-30 minutes. ? Remove the heat if your skin turns bright red. This is especially important if you are unable to feel pain, heat, or cold. You may have a greater risk of getting burned.  Keep lights dim if bright lights bother you or make your headaches worse. Eating and  drinking  Eat meals on a regular schedule.  If you drink alcohol: ? Limit how much you use to:  0-1 drink a day for women.  0-2 drinks a day for men. ? Be aware of how much alcohol is in your drink. In the [place] one drink equals one 12 oz bottle of beer (355 mL), one 5 oz glass of wine (148 mL), or one 1 oz glass of hard liquor (44 mL).  Stop drinking caffeine, or decrease the amount of caffeine you drink. General instructions   Keep a headache journal to help find out what may trigger your headaches. For example, write down: ? What you eat and drink. ? How much sleep you get. ? Any change to your diet or medicines.  Try massage or other relaxation techniques.  Limit stress.  Sit up straight, and do not tense your muscles.  Do not use any products that contain nicotine or tobacco, such as cigarettes, e-cigarettes, and chewing tobacco. If you need help quitting, ask your health care provider.  Exercise regularly as told by your health care provider.  Sleep on a regular schedule. Get 7-9 hours of sleep each night, or the amount recommended by your health care provider.  Keep all follow-up visits as told by your health care provider. This is important. Contact a health care provider if:  Your symptoms are not  helped by medicine.  You have a headache that is different from the usual headache.  You have nausea or you vomit.  You have a fever. Get help right away if:  Your headache becomes severe quickly.  Your headache gets worse after moderate to intense physical activity.  You have repeated vomiting.  You have a stiff neck.  You have a loss of vision.  You have problems with speech.  You have pain in the eye or ear.  You have muscular weakness or loss of muscle control.  You lose your balance or have trouble walking.  You feel faint or pass out.  You have confusion.  You have a seizure. Summary  A headache is pain or discomfort felt around the head  or neck area.  There are many causes and types of headaches. In some cases, the cause may not be found.  Keep a headache journal to help find out what may trigger your headaches. Watch your condition for any changes. Let your health care provider know about them.  Contact a health care provider if you have a headache that is different from the usual headache, or if your symptoms are not helped by medicine.  Get help right away if your headache becomes severe, you vomit, you have a loss of vision, you lose your balance, or you have a seizure. This information is not intended to replace advice given to you by your health care provider. Make sure you discuss any questions you have with your health care provider. Document Revised: 01/13/2018 Document Reviewed: 01/13/2018 Elsevier Patient Education  [employer].    If you have lab work done today you will be contacted with your lab results within the next 2 weeks.  If you have not heard from [place] then please contact us. The fastest way to get your results is to register for My Chart.   IF you received an x-ray today, you will receive an invoice from [facility]. Please contact [facility] at [phone] with questions or concerns regarding your invoice.   IF you received labwork today, you will receive an invoice from [facility]. Please contact [facility] at [phone] with questions or concerns regarding your invoice.   Our billing staff will not be able to assist you with questions regarding bills from these companies.  You will be contacted with the lab results as soon as they are available. The fastest way to get your results is to activate your My Chart account. Instructions are located on the last page of this paperwork. If you have not heard from [place] regarding the results in 2 weeks, please contact this office.

## 2020-05-05 ENCOUNTER — Other Ambulatory Visit: Payer: Self-pay

## 2020-05-05 ENCOUNTER — Ambulatory Visit (AMBULATORY_SURGERY_CENTER): Payer: Self-pay | Admitting: *Deleted

## 2020-05-05 VITALS — Ht 65.0 in | Wt 180.0 lb

## 2020-05-05 DIAGNOSIS — Z1211 Encounter for screening for malignant neoplasm of colon: Secondary | ICD-10-CM

## 2020-05-05 LAB — BASIC METABOLIC PANEL
BUN/Creatinine Ratio: 9 (ref 9–23)
BUN: 10 mg/dL (ref 6–24)
CO2: 21 mmol/L (ref 20–29)
Calcium: 9.5 mg/dL (ref 8.7–10.2)
Chloride: 106 mmol/L (ref 96–106)
Creatinine, Ser: 1.09 mg/dL — ABNORMAL HIGH (ref 0.57–1.00)
GFR calc Af Amer: 68 mL/min/{1.73_m2} (ref >59)
GFR calc non Af Amer: 59 mL/min/{1.73_m2} — ABNORMAL LOW (ref >59)
Glucose: 84 mg/dL (ref 65–99)
Potassium: 4.3 mmol/L (ref 3.5–5.2)
Sodium: 138 mmol/L (ref 134–144)

## 2020-05-05 NOTE — Progress Notes (Signed)
covid test 05-16-20 at 4:00 pm  Pt is aware that care partner will wait in the car during procedure; if they feel like they will be too hot or cold to wait in the car; they may wait in the 4 th floor lobby. Patient is aware to bring only one care partner. We want them to wear a mask (we do not have any that we can provide them), practice social distancing, and we will check their temperatures when they get here.  I did remind the patient that their care partner needs to stay in the parking lot the entire time and have a cell phone available, we will call them when the pt is ready for discharge. Patient will wear mask into building.   No egg or soy allergy  No home oxygen use   No medications for weight loss taken  emmi information given  Pt denies constipation issues   No trouble with anesthesia, difficulty with intubation or hx/fam hx of malignant hyperthermia per pt  Sutab sample given

## 2020-05-10 ENCOUNTER — Encounter: Payer: Self-pay | Admitting: Gastroenterology

## 2020-05-11 ENCOUNTER — Telehealth: Payer: Self-pay

## 2020-05-16 ENCOUNTER — Other Ambulatory Visit: Payer: Self-pay | Admitting: Gastroenterology

## 2020-05-17 LAB — SARS CORONAVIRUS 2 (TAT 6-24 HRS): SARS Coronavirus 2: NEGATIVE

## 2020-05-19 ENCOUNTER — Other Ambulatory Visit: Payer: Self-pay

## 2020-05-19 ENCOUNTER — Encounter: Payer: Self-pay | Admitting: Gastroenterology

## 2020-05-19 ENCOUNTER — Ambulatory Visit (AMBULATORY_SURGERY_CENTER): Payer: 59 | Admitting: Gastroenterology

## 2020-05-19 VITALS — BP 120/68 | HR 82 | Temp 97.5°F | Resp 12 | Wt 180.0 lb

## 2020-05-19 DIAGNOSIS — D128 Benign neoplasm of rectum: Secondary | ICD-10-CM

## 2020-05-19 DIAGNOSIS — Z1211 Encounter for screening for malignant neoplasm of colon: Secondary | ICD-10-CM | POA: Diagnosis present

## 2020-05-19 DIAGNOSIS — D127 Benign neoplasm of rectosigmoid junction: Secondary | ICD-10-CM

## 2020-05-19 DIAGNOSIS — K621 Rectal polyp: Secondary | ICD-10-CM | POA: Diagnosis not present

## 2020-05-19 DIAGNOSIS — K635 Polyp of colon: Secondary | ICD-10-CM

## 2020-05-19 DIAGNOSIS — D123 Benign neoplasm of transverse colon: Secondary | ICD-10-CM

## 2020-05-19 DIAGNOSIS — D120 Benign neoplasm of cecum: Secondary | ICD-10-CM

## 2020-05-19 MED ORDER — SODIUM CHLORIDE 0.9 % IV SOLN: 500.0000 mL | INTRAVENOUS | Status: DC

## 2020-05-19 NOTE — Patient Instructions (Signed)
Impression/Recommendations:  Polyp, hemorrhoid, and High fiber diet handouts given to patient.  Use FiberCon 1-2 tablets by mouth daily.  Continue present medications. Await pathology results.  Repeat colonoscopy for surveillance based on pathology results.  YOU HAD AN ENDOSCOPIC PROCEDURE TODAY AT [facility]:   Refer to the procedure report that was given to you for any specific questions about what was found during the examination.  If the procedure report does not answer your questions, please call your gastroenterologist to clarify.  If you requested that your care partner not be given the details of your procedure findings, then the procedure report has been included in a sealed envelope for you to review at your convenience later.  YOU SHOULD EXPECT: Some feelings of bloating in the abdomen. Passage of more gas than usual.  Walking can help get rid of the air that was put into your GI tract during the procedure and reduce the bloating. If you had a lower endoscopy (such as a colonoscopy or flexible sigmoidoscopy) you may notice spotting of blood in your stool or on the toilet paper. If you underwent a bowel prep for your procedure, you may not have a normal bowel movement for a few days.  Please Note:  You might notice some irritation and congestion in your nose or some drainage.  This is from the oxygen used during your procedure.  There is no need for concern and it should clear up in a day or so.  SYMPTOMS TO REPORT IMMEDIATELY:   Following lower endoscopy (colonoscopy or flexible sigmoidoscopy):  Excessive amounts of blood in the stool  Significant tenderness or worsening of abdominal pains  Swelling of the abdomen that is new, acute  Fever of 100F or higher For urgent or emergent issues, a gastroenterologist can be reached at any hour by calling [phone]. Do not use MyChart messaging for urgent concerns.    DIET:  We do recommend a small meal at first,  but then you may proceed to your regular diet.  Drink plenty of fluids but you should avoid alcoholic beverages for 24 hours.  ACTIVITY:  You should plan to take it easy for the rest of today and you should NOT DRIVE or use heavy machinery until tomorrow (because of the sedation medicines used during the test).    FOLLOW UP: Our staff will call the number listed on your records 48-72 hours following your procedure to check on you and address any questions or concerns that you may have regarding the information given to you following your procedure. If we do not reach you, we will leave a message.  We will attempt to reach you two times.  During this call, we will ask if you have developed any symptoms of COVID 19. If you develop any symptoms (ie: fever, flu-like symptoms, shortness of breath, cough etc.) before then, please call [phone].  If you test positive for Covid 19 in the 2 weeks post procedure, please call and report this information to [place].    If any biopsies were taken you will be contacted by phone or by letter within the next 1-3 weeks.  Please call us at [phone] if you have not heard about the biopsies in 3 weeks.    SIGNATURES/CONFIDENTIALITY: You and/or your care partner have signed paperwork which will be entered into your electronic medical record.  These signatures attest to the fact that that the information above on your After Visit Summary has been reviewed and is  understood.  Full responsibility of the confidentiality of this discharge information lies with you and/or your care-partner. 

## 2020-05-19 NOTE — Progress Notes (Signed)
Pt's states no medical or surgical changes since previsit or office visit. 

## 2020-05-19 NOTE — Progress Notes (Signed)
Report given to PACU, vss 

## 2020-05-19 NOTE — Op Note (Signed)
[facility] Patient Name: Yolanda Jennings Procedure Date: 05/19/2020 2:50 PM MRN: [id number] Endoscopist: Justice Britain , MD Age: 49 Referring MD:  Date of Birth: 10/02/1970 Gender: Female Account #: [id number] Procedure:                Colonoscopy Indications:              Screening for colorectal malignant neoplasm, This                            is the patient's first colonoscopy Medicines:                Monitored Anesthesia Care Procedure:                Pre-Anesthesia Assessment:                           - Prior to the procedure, a History and Physical                            was performed, and patient medications and                            allergies were reviewed. The patient's tolerance of                            previous anesthesia was also reviewed. The risks                            and benefits of the procedure and the sedation                            options and risks were discussed with the patient.                            All questions were answered, and informed consent                            was obtained. Prior Anticoagulants: The patient has                            taken no previous anticoagulant or antiplatelet                            agents. ASA Grade Assessment: II - A patient with                            mild systemic disease. After reviewing the risks                            and benefits, the patient was deemed in                            satisfactory condition to undergo the procedure.  After obtaining informed consent, the colonoscope                            was passed under direct vision. Throughout the                            procedure, the patient's blood pressure, pulse, and                            oxygen saturations were monitored continuously. The                            Colonoscope was introduced through the anus and                            advanced to the  the cecum, identified by                            appendiceal orifice and ileocecal valve. The                            colonoscopy was performed without difficulty. The                            patient tolerated the procedure. The quality of the                            bowel preparation was adequate. The terminal ileum,                            ileocecal valve, appendiceal orifice, and rectum                            were photographed. Scope In: 3:14:55 PM Scope Out: 3:34:55 PM Scope Withdrawal Time: 0 hours 17 minutes 46 seconds  Total Procedure Duration: 0 hours 20 minutes 0 seconds  Findings:                 The digital rectal exam findings include                            hemorrhoids. Pertinent negatives include no                            palpable rectal lesions.                           A large amount of liquid semi-liquid stool was                            found in the entire colon, interfering with                            visualization. Lavage of the area was performed  using copious amounts, resulting in clearance with                            adequate visualization.                           The terminal ileum and ileocecal valve appeared                            normal.                           Three sessile polyps were found in the transverse                            colon (2) and cecum (1). The polyps were 2 to 5 mm                            in size. These polyps were removed with a cold                            snare. Resection and retrieval were complete.                           Many sessile polyps (>30) were found in the rectum,                            recto-sigmoid colon and sigmoid colon. The polyps                            were 1 to 6 mm in size. They have appearance of                            typical hyperplastic polyps. Seven of these polyps                            were removed with a cold snare  for sampling                            purposes to rule out adenomatous tissue. Resection                            and retrieval were complete of these polyps.                           Normal mucosa was found in the entire colon                            otherwise.                           Non-bleeding non-thrombosed external and internal  hemorrhoids were found during retroflexion, during                            perianal exam and during digital exam. The                            hemorrhoids were Grade II (internal hemorrhoids                            that prolapse but reduce spontaneously). Complications:            No immediate complications. Estimated Blood Loss:     Estimated blood loss was minimal. Impression:               - Hemorrhoids found on digital rectal exam.                           - Stool in the entire examined colon.                           - The examined portion of the ileum was normal.                           - Three 2 to 5 mm polyps in the transverse colon                            and in the cecum, removed with a cold snare.                            Resected and retrieved.                           - Many 1 to 6 mm polyps in the rectum, at the                            recto-sigmoid colon and in the sigmoid colon -                            consistent with hyperplastic appearing polyps,                            removed seven with a cold snare. Resected and                            retrieved.                           - Normal mucosa in the entire examined colon                            otherwise.                           - Non-bleeding non-thrombosed external and internal  hemorrhoids. Recommendation:           - The patient will be observed post-procedure,                            until all discharge criteria are met.                           - Discharge patient to home.                            - Patient has a contact number available for                            emergencies. The signs and symptoms of potential                            delayed complications were discussed with the                            patient. Return to normal activities tomorrow.                            Written discharge instructions were provided to the                            patient.                           - High fiber diet.                           - Use FiberCon 1-2 tablets PO daily.                           - Continue present medications.                           - Await pathology results.                           - Repeat colonoscopy for surveillance based on                            pathology results.                           - If patient has evidence of adenomatous tissue in                            the left colon polyp jar then would recommend a                            flexible sigmoidoscopy in 68-month for removal of                            all polyps. Otherwise  follow up dictated by the                            pathology in the first jar.                           - The findings and recommendations were discussed                            with the patient. Justice Britain, MD 05/19/2020 3:42:24 PM

## 2020-05-19 NOTE — Progress Notes (Signed)
Called to room to assist during endoscopic procedure.  Patient ID and intended procedure confirmed with present staff. Received instructions for my participation in the procedure from the performing physician.  

## 2020-05-20 ENCOUNTER — Ambulatory Visit: Payer: 59 | Admitting: Family Medicine

## 2020-05-23 ENCOUNTER — Telehealth: Payer: Self-pay | Admitting: *Deleted

## 2020-05-23 NOTE — Telephone Encounter (Signed)
  Follow up Call-  Call back number 05/19/2020  Post procedure Call Back phone  # [phone]  Permission to leave phone message Yes  Some recent data might be hidden     Patient questions:  Do you have a fever, pain , or abdominal swelling? No. Pain Score  0 *  Have you tolerated food without any problems? Yes.    Have you been able to return to your normal activities? Yes.    Do you have any questions about your discharge instructions: Diet   No. Medications  No. Follow up visit  No.  Do you have questions or concerns about your Care? No.  Actions: * If pain score is 4 or above: 1. No action needed, pain <4.Have you developed a fever since your procedure? no  2.   Have you had an respiratory symptoms (SOB or cough) since your procedure? no  3.   Have you tested positive for COVID 19 since your procedure no  4.   Have you had any family members/close contacts diagnosed with the COVID 19 since your procedure?  no   If yes to any of these questions please route to Joylene John, RN and Joella Prince, RN

## 2020-05-25 ENCOUNTER — Ambulatory Visit: Payer: 59 | Admitting: Family Medicine

## 2020-05-25 ENCOUNTER — Other Ambulatory Visit: Payer: Self-pay

## 2020-05-26 ENCOUNTER — Encounter: Payer: Self-pay | Admitting: Gastroenterology

## 2020-05-31 ENCOUNTER — Ambulatory Visit
Admission: RE | Admit: 2020-05-31 | Discharge: 2020-05-31 | Disposition: A | Discharge status: Home / Self Care | Payer: Managed Care, Other (non HMO) | Source: Ambulatory Visit | Attending: Family Medicine | Admitting: Family Medicine

## 2020-05-31 DIAGNOSIS — G4452 New daily persistent headache (NDPH): Secondary | ICD-10-CM

## 2020-06-08 ENCOUNTER — Ambulatory Visit (INDEPENDENT_AMBULATORY_CARE_PROVIDER_SITE_OTHER): Payer: 59

## 2020-06-08 ENCOUNTER — Ambulatory Visit (INDEPENDENT_AMBULATORY_CARE_PROVIDER_SITE_OTHER): Payer: 59 | Admitting: Family Medicine

## 2020-06-08 ENCOUNTER — Encounter: Payer: Self-pay | Admitting: Family Medicine

## 2020-06-08 ENCOUNTER — Other Ambulatory Visit: Payer: Self-pay

## 2020-06-08 VITALS — BP 127/80 | HR 77 | Temp 98.1°F | Ht 65.0 in | Wt 179.0 lb

## 2020-06-08 DIAGNOSIS — G44209 Tension-type headache, unspecified, not intractable: Secondary | ICD-10-CM

## 2020-06-08 DIAGNOSIS — I10 Essential (primary) hypertension: Secondary | ICD-10-CM | POA: Diagnosis not present

## 2020-06-08 DIAGNOSIS — M542 Cervicalgia: Secondary | ICD-10-CM

## 2020-06-08 MED ORDER — METHOCARBAMOL 500 MG PO TABS: 500.0000 mg | ORAL_TABLET | Freq: Three times a day (TID) | ORAL | 0 refills | Status: DC | PRN

## 2020-06-08 NOTE — Progress Notes (Signed)
Subjective:  Patient ID: Yolanda Jennings, female    DOB: 08-Jan-1971  Age: 49 y.o. MRN: [id number]  CC:  Chief Complaint  Patient presents with  . Follow-up    on hypertension. Pt report her home BP reading has been around 130/78. Pt states she has been having bilateral neck tension and head aches. PT reports she had a head scan and it came back normal. no other symptoms to reports at this time.    HPI Yolanda Jennings presents for   Hypertension: Follow-up from October 27.  Higher dose of carvedilol prescribed for improved control, headaches discussed at that time.  Possible rebound headache with ibuprofen use, nonfocal neuro exam.  CT head without concerning findings, was referred to neurology. Home readings: 132-134/75-81 No new side effects on higher dose carvedilol.  Busier work - more hours, overtime. Not stressful, but long days. Looking up all day - to left or right. Looking down at hand scanner.  Notices more headaches at work with looking up. Better at home. Headaches improved, tension in neck same.  Ibuprofen once this week. 400mg .  Neuro appt after the first of the year. No weakness or new neurologic symptoms. Ache at base of neck at times.  No night sweats, fever, unexplained wt loss.     BP Readings from Last 3 Encounters:  06/08/20 127/80  05/19/20 120/68  05/04/20 130/76   Lab Results  Component Value Date   CREATININE 1.09 (H) 05/04/2020     History Patient Active Problem List   Diagnosis Date Noted  . Obesity (BMI 30.0-34.9) 08/22/2014  . Microcytic anemia 09/03/2013  . Headache 09/03/2013  . HTN (hypertension) 08/28/2013   Past Medical History:  Diagnosis Date  . Allergy   . Hypertension   . Sickle cell trait ([place])   . Tobacco abuse    Stopped smoking 08/24/13   Past Surgical History:  Procedure Laterality Date  . BREAST BIOPSY Left 2011  . HERNIA REPAIR    . TUBAL LIGATION     2003   Allergies  Allergen Reactions  . Ace Inhibitors  Swelling and Other (See Comments)    Reaction:  Tongue swelling   . Lisinopril Swelling and Other (See Comments)    Reaction:  Tongue swelling    Prior to Admission medications   Medication Sig Start Date End Date Taking? Authorizing Provider  amLODipine (NORVASC) 10 MG tablet Take 1 tablet (10 mg total) by mouth daily. 05/04/20  Yes [name], MD  carvedilol (COREG) 25 MG tablet Take 1 tablet (25 mg total) by mouth 2 (two) times daily with a meal. Patient taking differently: Take 25 mg by mouth 2 (two) times daily with a meal. Takes 12.5 mg tablet twice daily 05/04/20  Yes [name], MD  cloNIDine (CATAPRES) 0.1 MG tablet Take 1 tablet (0.1 mg total) by mouth 2 (two) times daily. 05/04/20  Yes [name], MD  ipratropium (ATROVENT) 0.06 % nasal spray Place 1-2 sprays into both nostrils 4 (four) times daily. As needed for nasal congestion 02/05/20  Yes [name], MD  potassium chloride SA (KLOR-CON) 20 MEQ tablet Take 2 tablets (40 mEq total) by mouth daily. 05/04/20  Yes [name], MD   Social History   Socioeconomic History  . Marital status: Single    Spouse name: Not on file  . Number of children: Not on file  . Years of education: Not on file  . Highest education level: Not on  file  Occupational History  . Occupation: Freight forwarder  Tobacco Use  . Smoking status: Former Smoker    Years: 20.00    Quit date: 08/24/2013    Years since quitting: 6.7  . Smokeless tobacco: Never Used  Vaping Use  . Vaping Use: Never used  Substance and Sexual Activity  . Alcohol use: Yes    Alcohol/week: 0.0 standard drinks    Comment: occasional  . Drug use: No  . Sexual activity: Yes  Other Topics Concern  . Not on file  Social History Narrative   Marital status: Single      Children: 2 children; no grandchildren.      Lives: with two children.      Employment: Freight forwarder      Tobacco: quit 08/2013.       Alcohol:  Socially.      Exercise:  Yes      Education: [employer].   Social Determinants of Health   Financial Resource Strain:   . Difficulty of Paying Living Expenses: Not on file  Food Insecurity:   . Worried About Charity fundraiser in the Last Year: Not on file  . Ran Out of Food in the Last Year: Not on file  Transportation Needs:   . Lack of Transportation (Medical): Not on file  . Lack of Transportation (Non-Medical): Not on file  Physical Activity:   . Days of Exercise per Week: Not on file  . Minutes of Exercise per Session: Not on file  Stress:   . Feeling of Stress : Not on file  Social Connections:   . Frequency of Communication with Friends and Family: Not on file  . Frequency of Social Gatherings with Friends and Family: Not on file  . Attends Religious Services: Not on file  . Active Member of Clubs or Organizations: Not on file  . Attends Archivist Meetings: Not on file  . Marital Status: Not on file  Intimate Partner Violence:   . Fear of Current or Ex-Partner: Not on file  . Emotionally Abused: Not on file  . Physically Abused: Not on file  . Sexually Abused: Not on file    Review of Systems Per HPI.  Objective:   Vitals:   06/08/20 1633  BP: 127/80  Pulse: 77  Temp: 98.1 F (36.7 C)  TempSrc: Temporal  SpO2: 99%  Weight: 179 lb (81.2 kg)  Height: 5\' 5"  (1.651 m)     Physical Exam Vitals reviewed.  Constitutional:      Appearance: She is well-developed.  HENT:     Head: Normocephalic and atraumatic.  Eyes:     Conjunctiva/sclera: Conjunctivae normal.     Pupils: Pupils are equal, round, and reactive to light.  Neck:     Vascular: No carotid bruit.  Cardiovascular:     Rate and Rhythm: Normal rate and regular rhythm.     Heart sounds: Normal heart sounds.  Pulmonary:     Effort: Pulmonary effort is normal.     Breath sounds: Normal breath sounds.  Abdominal:     Palpations: Abdomen is soft. There is no pulsatile mass.     Tenderness: There is no  abdominal tenderness.  Musculoskeletal:     Comments: C-spine, no focal bony tenderness but does note slight discomfort of paraspinal muscles, previous discomfort at vertebral prominens.  Slight decreased extension, otherwise intact range of motion.  Upper extremity reflexes 2+ and equal bilaterally, upper extremity strength intact and equal bilaterally.  Skin:    General: Skin is warm and dry.  Neurological:     General: No focal deficit present.     Mental Status: She is alert and oriented to person, place, and time.  Psychiatric:        Behavior: Behavior normal.     DG Cervical Spine Complete  Result Date: 06/08/2020 CLINICAL DATA:  Tension headache. Neck pain including in the lower midline neck region. No reported injury. EXAM: CERVICAL SPINE - COMPLETE 4+ VIEW COMPARISON:  None. FINDINGS: On the lateral view the cervical spine is visualized to the level of C7-T1. Straightening of the cervical spine. Pre-vertebral soft tissues are within normal limits. No fracture is detected in the cervical spine. Dens is well positioned between the lateral masses of C1 accounting for slight head rotation. Mild multilevel cervical degenerative disc disease, most prominent at C3-4. No subluxation. No significant facet arthropathy. No appreciable foraminal stenosis. No aggressive-appearing focal osseous lesions. IMPRESSION: 1. Straightening of the cervical spine, usually due to positioning and/or muscle spasm. 2. Mild multilevel cervical degenerative disc disease, most prominent at C3-4. Electronically Signed   By: Ilona Sorrel M.D.   On: 06/08/2020 17:33      Assessment & Plan:  Yolanda Jennings is a 49 y.o. female . Neck pain - Plan: methocarbamol (ROBAXIN) 500 MG tablet, DG Cervical Spine Complete Tension headache - Plan: methocarbamol (ROBAXIN) 500 MG tablet, DG Cervical Spine Complete  - likely tension HA, with degenerative changes in c spine also factor.nonfocal/reassuring exam  -  Rom/stretches. Trial of robaxin - SED, not with driving/machinery. Keep follow up with neuro, rtc precautions.   Essential hypertension  -  Stable, tolerating current regimen. 5 month recheck.   Meds ordered this encounter  Medications  . methocarbamol (ROBAXIN) 500 MG tablet    Sig: Take 1 tablet (500 mg total) by mouth every 8 (eight) hours as needed for muscle spasms (start at bedtime initially).    Dispense:  30 tablet    Refill:  0   Patient Instructions   See information below on tension headache, I will let you know if there are concerns on x-ray.  Try range of motion, stretches throughout the day, heat or ice to muscle spasm as needed, muscle relaxant can be started at bedtime initially, consider during the day as long as you are not driving or operating machinery.  Tylenol during the day is okay, but try to minimize how often you use ibuprofen.  Let me know how things are going in the next couple weeks, and okay to keep follow-up with neurology.   Return to the clinic or go to the nearest emergency room if any of your symptoms worsen or new symptoms occur.   Tension Headache, Adult A tension headache is a feeling of pain, pressure, or aching in the head that is often felt over the front and sides of the head. The pain can be dull, or it can feel tight (constricting). There are two types of tension headache:  Episodic tension headache. This is when the headaches happen fewer than 15 days a month.  Chronic tension headache. This is when the headaches happen more than 15 days a month during a 50-month period. A tension headache can last from 30 minutes to several days. It is the most common kind of headache. Tension headaches are not normally associated with nausea or vomiting, and they do not get worse with physical activity. What are the causes? The exact cause of this condition  is not known. Tension headaches are often triggered by stress, anxiety, or depression. Other triggers  include:  Alcohol.  Too much caffeine or caffeine withdrawal.  Respiratory infections, such as colds, flu, or sinus infections.  Dental problems or teeth clenching.  Tiredness (fatigue).  Holding your head and neck in the same position for a long period of time, such as while using a computer.  Smoking.  Arthritis of the neck. What are the signs or symptoms? Symptoms of this condition include:  A feeling of pressure or tightness around the head.  Dull, aching head pain.  Pain over the front and sides of the head.  Tenderness in the muscles of the head, neck, and shoulders. How is this diagnosed? This condition may be diagnosed based on your symptoms, your medical history, and a physical exam. If your symptoms are severe or unusual, you may have imaging tests, such as a CT scan or an MRI of your head. Your vision may also be checked. How is this treated? This condition may be treated with lifestyle changes and with medicines that help relieve symptoms. Follow these instructions at home: Managing pain  Take over-the-counter and prescription medicines only as told by your health care provider.  When you have a headache, lie down in a dark, quiet room.  If directed, apply ice to the head and neck: ? Put ice in a plastic bag. ? Place a towel between your skin and the bag. ? Leave the ice on for 20 minutes, 2-3 times a day.  If directed, apply heat to the back of your neck as often as told by your health care provider. Use the heat source that your health care provider recommends, such as a moist heat pack or a heating pad. ? Place a towel between your skin and the heat source. ? Leave the heat on for 20-30 minutes. ? Remove the heat if your skin turns bright red. This is especially important if you are unable to feel pain, heat, or cold. You may have a greater risk of getting burned. Eating and drinking  Eat meals on a regular schedule.  Limit alcohol intake to no more  than 1 drink a day for nonpregnant women and 2 drinks a day for men. One drink equals 12 oz of beer, 5 oz of wine, or 1 oz of hard liquor.  Drink enough fluid to keep your urine pale yellow.  Decrease your caffeine intake, or stop using caffeine. Lifestyle  Get 7-9 hours of sleep each night, or get the amount of sleep recommended by your health care provider.  At bedtime, remove all electronic devices from your room. Electronic devices include computers, phones, and tablets.  Find ways to manage your stress. Some things that can help relieve stress include: ? Exercise. ? Deep breathing exercises. ? Yoga. ? Listening to music. ? Positive mental imagery.  Try to sit up straight and avoid tensing your muscles.  Do not use any products that contain nicotine or tobacco, such as cigarettes and e-cigarettes. If you need help quitting, ask your health care provider. General instructions   Keep all follow-up visits as told by your health care provider. This is important.  Avoid any headache triggers. Keep a headache journal to help find out what may trigger your headaches. For example, write down: ? What you eat and drink. ? How much sleep you get. ? Any change to your diet or medicines. Contact a health care provider if:  Your headache does not  get better.  Your headache comes back.  You are sensitive to sounds, light, or smells because of a headache.  You have nausea or you vomit.  Your stomach hurts. Get help right away if:  You suddenly develop a very severe headache along with any of the following: ? A stiff neck. ? Nausea and vomiting. ? Confusion. ? Weakness. ? Double vision or loss of vision. ? Shortness of breath. ? Rash. ? Unusual sleepiness. ? Fever. ? Trouble speaking. ? Pain in your eyes or ears. ? Trouble walking or balancing. ? Feeling faint or passing out. Summary  A tension headache is a feeling of pain, pressure, or aching in the head that is often  felt over the front and sides of the head.  A tension headache can last from 30 minutes to several days. It is the most common kind of headache.  This condition may be diagnosed based on your symptoms, your medical history, and a physical exam.  This condition may be treated with lifestyle changes and with medicines that help relieve symptoms. This information is not intended to replace advice given to you by your health care provider. Make sure you discuss any questions you have with your health care provider. Document Revised: 04/22/2019 Document Reviewed: 10/05/2016 Elsevier Patient Education  [employer].   If you have lab work done today you will be contacted with your lab results within the next 2 weeks.  If you have not heard from [place] then please contact us. The fastest way to get your results is to register for My Chart.   IF you received an x-ray today, you will receive an invoice from [facility]. Please contact [facility] at [phone] with questions or concerns regarding your invoice.   IF you received labwork today, you will receive an invoice from [place]. Please contact [facility] at [phone] with questions or concerns regarding your invoice.   Our billing staff will not be able to assist you with questions regarding bills from these companies.  You will be contacted with the lab results as soon as they are available. The fastest way to get your results is to activate your My Chart account. Instructions are located on the last page of this paperwork. If you have not heard from [place] regarding the results in 2 weeks, please contact this office.         Signed, [name], MD [facility]

## 2020-06-08 NOTE — Patient Instructions (Addendum)
See information below on tension headache, I will let you know if there are concerns on x-ray.  Try range of motion, stretches throughout the day, heat or ice to muscle spasm as needed, muscle relaxant can be started at bedtime initially, consider during the day as long as you are not driving or operating machinery.  Tylenol during the day is okay, but try to minimize how often you use ibuprofen.  Let me know how things are going in the next couple weeks, and okay to keep follow-up with neurology.   Return to the clinic or go to the nearest emergency room if any of your symptoms worsen or new symptoms occur.   Tension Headache, Adult A tension headache is a feeling of pain, pressure, or aching in the head that is often felt over the front and sides of the head. The pain can be dull, or it can feel tight (constricting). There are two types of tension headache:  Episodic tension headache. This is when the headaches happen fewer than 15 days a month.  Chronic tension headache. This is when the headaches happen more than 15 days a month during a 71-month period. A tension headache can last from 30 minutes to several days. It is the most common kind of headache. Tension headaches are not normally associated with nausea or vomiting, and they do not get worse with physical activity. What are the causes? The exact cause of this condition is not known. Tension headaches are often triggered by stress, anxiety, or depression. Other triggers include:  Alcohol.  Too much caffeine or caffeine withdrawal.  Respiratory infections, such as colds, flu, or sinus infections.  Dental problems or teeth clenching.  Tiredness (fatigue).  Holding your head and neck in the same position for a long period of time, such as while using a computer.  Smoking.  Arthritis of the neck. What are the signs or symptoms? Symptoms of this condition include:  A feeling of pressure or tightness around the head.  Dull,  aching head pain.  Pain over the front and sides of the head.  Tenderness in the muscles of the head, neck, and shoulders. How is this diagnosed? This condition may be diagnosed based on your symptoms, your medical history, and a physical exam. If your symptoms are severe or unusual, you may have imaging tests, such as a CT scan or an MRI of your head. Your vision may also be checked. How is this treated? This condition may be treated with lifestyle changes and with medicines that help relieve symptoms. Follow these instructions at home: Managing pain  Take over-the-counter and prescription medicines only as told by your health care provider.  When you have a headache, lie down in a dark, quiet room.  If directed, apply ice to the head and neck: ? Put ice in a plastic bag. ? Place a towel between your skin and the bag. ? Leave the ice on for 20 minutes, 2-3 times a day.  If directed, apply heat to the back of your neck as often as told by your health care provider. Use the heat source that your health care provider recommends, such as a moist heat pack or a heating pad. ? Place a towel between your skin and the heat source. ? Leave the heat on for 20-30 minutes. ? Remove the heat if your skin turns bright red. This is especially important if you are unable to feel pain, heat, or cold. You may have a greater risk of  getting burned. Eating and drinking  Eat meals on a regular schedule.  Limit alcohol intake to no more than 1 drink a day for nonpregnant women and 2 drinks a day for men. One drink equals 12 oz of beer, 5 oz of wine, or 1 oz of hard liquor.  Drink enough fluid to keep your urine pale yellow.  Decrease your caffeine intake, or stop using caffeine. Lifestyle  Get 7-9 hours of sleep each night, or get the amount of sleep recommended by your health care provider.  At bedtime, remove all electronic devices from your room. Electronic devices include computers, phones, and  tablets.  Find ways to manage your stress. Some things that can help relieve stress include: ? Exercise. ? Deep breathing exercises. ? Yoga. ? Listening to music. ? Positive mental imagery.  Try to sit up straight and avoid tensing your muscles.  Do not use any products that contain nicotine or tobacco, such as cigarettes and e-cigarettes. If you need help quitting, ask your health care provider. General instructions   Keep all follow-up visits as told by your health care provider. This is important.  Avoid any headache triggers. Keep a headache journal to help find out what may trigger your headaches. For example, write down: ? What you eat and drink. ? How much sleep you get. ? Any change to your diet or medicines. Contact a health care provider if:  Your headache does not get better.  Your headache comes back.  You are sensitive to sounds, light, or smells because of a headache.  You have nausea or you vomit.  Your stomach hurts. Get help right away if:  You suddenly develop a very severe headache along with any of the following: ? A stiff neck. ? Nausea and vomiting. ? Confusion. ? Weakness. ? Double vision or loss of vision. ? Shortness of breath. ? Rash. ? Unusual sleepiness. ? Fever. ? Trouble speaking. ? Pain in your eyes or ears. ? Trouble walking or balancing. ? Feeling faint or passing out. Summary  A tension headache is a feeling of pain, pressure, or aching in the head that is often felt over the front and sides of the head.  A tension headache can last from 30 minutes to several days. It is the most common kind of headache.  This condition may be diagnosed based on your symptoms, your medical history, and a physical exam.  This condition may be treated with lifestyle changes and with medicines that help relieve symptoms. This information is not intended to replace advice given to you by your health care provider. Make sure you discuss any  questions you have with your health care provider. Document Revised: 04/22/2019 Document Reviewed: 10/05/2016 Elsevier Patient Education  [employer].   If you have lab work done today you will be contacted with your lab results within the next 2 weeks.  If you have not heard from [place] then please contact us. The fastest way to get your results is to register for My Chart.   IF you received an x-ray today, you will receive an invoice from [facility]. Please contact [facility] at [phone] with questions or concerns regarding your invoice.   IF you received labwork today, you will receive an invoice from [place]. Please contact [facility] at [phone] with questions or concerns regarding your invoice.   Our billing staff will not be able to assist you with questions regarding bills from these companies.  You will be contacted with  the lab results as soon as they are available. The fastest way to get your results is to activate your My Chart account. Instructions are located on the last page of this paperwork. If you have not heard from [place] regarding the results in 2 weeks, please contact this office.

## 2020-06-09 ENCOUNTER — Encounter: Payer: Self-pay | Admitting: Family Medicine
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
## 2020-07-20 ENCOUNTER — Ambulatory Visit (INDEPENDENT_AMBULATORY_CARE_PROVIDER_SITE_OTHER): Payer: 59 | Admitting: Neurology

## 2020-07-20 ENCOUNTER — Encounter: Payer: Self-pay | Admitting: Neurology

## 2020-07-20 VITALS — BP 132/76 | HR 69 | Ht 65.0 in | Wt 178.0 lb

## 2020-07-20 DIAGNOSIS — M7918 Myalgia, other site: Secondary | ICD-10-CM | POA: Insufficient documentation

## 2020-07-20 MED ORDER — METHYLPREDNISOLONE 4 MG PO TBPK: ORAL_TABLET | ORAL | 1 refills | Status: DC

## 2020-07-20 MED ORDER — CYCLOBENZAPRINE HCL 10 MG PO TABS: 10.0000 mg | ORAL_TABLET | Freq: Every day | ORAL | 3 refills | Status: DC

## 2020-07-20 NOTE — Patient Instructions (Addendum)
Flexeril at bedtime Medrol Dosepak: Start Today Robaxin twice during the day (try it first to ensure it doesn't make you drowsy) Physical Therapy:  [facility] and PT. Cervical myofascial pain. Please evaluate and treat including dry needling, stretching, strengthening, manual therapy/massage, heating, TENS unit, exercising  and rhomboid strengthening as clinically warranted as well as any other modality as recommended by evaluation.  Cyclobenzaprine tablets What is this medicine? CYCLOBENZAPRINE (sye kloe BEN za preen) is a muscle relaxer. It is used to treat muscle pain, spasms, and stiffness. This medicine may be used for other purposes; ask your health care provider or pharmacist if you have questions. COMMON BRAND NAME(S): Fexmid, Flexeril What should I tell my health care provider before I take this medicine? They need to know if you have any of these conditions:  heart disease, irregular heartbeat, or previous heart attack  liver disease  thyroid problem  an unusual or allergic reaction to cyclobenzaprine, tricyclic antidepressants, lactose, other medicines, foods, dyes, or preservatives  pregnant or trying to get pregnant  breast-feeding How should I use this medicine? Take this medicine by mouth with a glass of water. Follow the directions on the prescription label. If this medicine upsets your stomach, take it with food or milk. Take your medicine at regular intervals. Do not take it more often than directed. Talk to your pediatrician regarding the use of this medicine in children. Special care may be needed. Overdosage: If you think you have taken too much of this medicine contact a poison control center or emergency room at once. NOTE: This medicine is only for you. Do not share this medicine with others. What if I miss a dose? If you miss a dose, take it as soon as you can. If it is almost time for your next dose, take only that dose. Do not take double or extra  doses. What may interact with this medicine? Do not take this medicine with any of the following medications:  MAOIs like Carbex, Eldepryl, Marplan, Nardil, and Parnate  narcotic medicines for cough  safinamide This medicine may also interact with the following medications:  alcohol  bupropion  antihistamines for allergy, cough and cold  certain medicines for anxiety or sleep  certain medicines for bladder problems like oxybutynin, tolterodine  certain medicines for depression like amitriptyline, fluoxetine, sertraline  certain medicines for Parkinson's disease like benztropine, trihexyphenidyl  certain medicines for seizures like phenobarbital, primidone  certain medicines for stomach problems like dicyclomine, hyoscyamine  certain medicines for travel sickness like scopolamine  general anesthetics like halothane, isoflurane, methoxyflurane, propofol  ipratropium  local anesthetics like lidocaine, pramoxine, tetracaine  medicines that relax muscles for surgery  narcotic medicines for pain  phenothiazines like chlorpromazine, mesoridazine, prochlorperazine, thioridazine  verapamil This list may not describe all possible interactions. Give your health care provider a list of all the medicines, herbs, non-prescription drugs, or dietary supplements you use. Also tell them if you smoke, drink alcohol, or use illegal drugs. Some items may interact with your medicine. What should I watch for while using this medicine? Tell your doctor or health care professional if your symptoms do not start to get better or if they get worse. You may get drowsy or dizzy. Do not drive, use machinery, or do anything that needs mental alertness until you know how this medicine affects you. Do not stand or sit up quickly, especially if you are an older patient. This reduces the risk of dizzy or fainting spells. Alcohol may interfere with  the effect of this medicine. Avoid alcoholic drinks. If  you are taking another medicine that also causes drowsiness, you may have more side effects. Give your health care provider a list of all medicines you use. Your doctor will tell you how much medicine to take. Do not take more medicine than directed. Call emergency for help if you have problems breathing or unusual sleepiness. Your mouth may get dry. Chewing sugarless gum or sucking hard candy, and drinking plenty of water may help. Contact your doctor if the problem does not go away or is severe. What side effects may I notice from receiving this medicine? Side effects that you should report to your doctor or health care professional as soon as possible:  allergic reactions like skin rash, itching or hives, swelling of the face, lips, or tongue  breathing problems  chest pain  fast, irregular heartbeat  hallucinations  seizures  unusually weak or tired Side effects that usually do not require medical attention (report to your doctor or health care professional if they continue or are bothersome):  headache  nausea, vomiting This list may not describe all possible side effects. Call your doctor for medical advice about side effects. You may report side effects to FDA at 1-800-FDA-1088. Where should I keep my medicine? Keep out of the reach of children. Store at room temperature between 15 and 30 degrees C (59 and 86 degrees F). Keep container tightly closed. Throw away any unused medicine after the expiration date. NOTE: This sheet is a summary. It may not cover all possible information. If you have questions about this medicine, talk to your doctor, pharmacist, or health care provider.  2021 Elsevier/Gold Standard (2018-05-28 12:49:26)  Methylprednisolone tablets What is this medicine? METHYLPREDNISOLONE (meth ill pred NISS oh lone) is a corticosteroid. It is commonly used to treat inflammation of the skin, joints, lungs, and other organs. Common conditions treated include asthma,  allergies, and arthritis. It is also used for other conditions, such as blood disorders and diseases of the adrenal glands. This medicine may be used for other purposes; ask your health care provider or pharmacist if you have questions. COMMON BRAND NAME(S): Medrol, Medrol Dosepak What should I tell my health care provider before I take this medicine? They need to know if you have any of these conditions:  Cushing's syndrome  eye disease, vision problems  diabetes  glaucoma  heart disease  high blood pressure  infection (especially a virus infection such as chickenpox, cold sores, or herpes)  liver disease  mental illness  myasthenia gravis  osteoporosis  recently received or scheduled to receive a vaccine  seizures  stomach or intestine problems  thyroid disease  an unusual or allergic reaction to lactose, methylprednisolone, other medicines, foods, dyes, or preservatives  pregnant or trying to get pregnant  breast-feeding How should I use this medicine? Take this medicine by mouth with a glass of water. Follow the directions on the prescription label. Take this medicine with food. If you are taking this medicine once a day, take it in the morning. Do not take it more often than directed. Do not suddenly stop taking your medicine because you may develop a severe reaction. Your doctor will tell you how much medicine to take. If your doctor wants you to stop the medicine, the dose may be slowly lowered over time to avoid any side effects. Talk to your pediatrician regarding the use of this medicine in children. Special care may be needed. Overdosage: If you  think you have taken too much of this medicine contact a poison control center or emergency room at once. NOTE: This medicine is only for you. Do not share this medicine with others. What if I miss a dose? If you miss a dose, take it as soon as you can. If it is almost time for your next dose, talk to your doctor or  health care professional. You may need to miss a dose or take an extra dose. Do not take double or extra doses without advice. What may interact with this medicine? Do not take this medicine with any of the following medications:  alefacept  echinacea  live virus vaccines  metyrapone  mifepristone This medicine may also interact with the following medications:  amphotericin B  aspirin and aspirin-like medicines  certain antibiotics like erythromycin, clarithromycin, troleandomycin  certain medicines for diabetes  certain medicines for fungal infections like ketoconazole  certain medicines for seizures like carbamazepine, phenobarbital, phenytoin  certain medicines that treat or prevent blood clots like warfarin  cholestyramine  cyclosporine  digoxin  diuretics  female hormones, like estrogens and birth control pills  isoniazid  NSAIDs, medicines for pain inflammation, like ibuprofen or naproxen  other medicines for myasthenia gravis  rifampin  vaccines This list may not describe all possible interactions. Give your health care provider a list of all the medicines, herbs, non-prescription drugs, or dietary supplements you use. Also tell them if you smoke, drink alcohol, or use illegal drugs. Some items may interact with your medicine. What should I watch for while using this medicine? Tell your doctor or healthcare professional if your symptoms do not start to get better or if they get worse. Do not stop taking except on your doctor's advice. You may develop a severe reaction. Your doctor will tell you how much medicine to take. This medicine may increase your risk of getting an infection. Tell your doctor or health care professional if you are around anyone with measles or chickenpox, or if you develop sores or blisters that do not heal properly. This medicine may increase blood sugar levels. Ask your healthcare provider if changes in diet or medicines are needed  if you have diabetes. Tell your doctor or health care professional right away if you have any change in your eyesight. Using this medicine for a long time may increase your risk of low bone mass. Talk to your doctor about bone health. What side effects may I notice from receiving this medicine? Side effects that you should report to your doctor or health care professional as soon as possible:  allergic reactions like skin rash, itching or hives, swelling of the face, lips, or tongue  bloody or tarry stools  hallucination, loss of contact with reality  muscle cramps  muscle pain  palpitations  signs and symptoms of high blood sugar such as being more thirsty or hungry or having to urinate more than normal. You may also feel very tired or have blurry vision.  signs and symptoms of infection like fever or chills; cough; sore throat; pain or trouble passing urine Side effects that usually do not require medical attention (report to your doctor or health care professional if they continue or are bothersome):  changes in emotions or mood  constipation  diarrhea  excessive hair growth on the face or body  headache  nausea, vomiting  trouble sleeping  weight gain This list may not describe all possible side effects. Call your doctor for medical advice about side effects.  You may report side effects to FDA at 1-800-FDA-1088. Where should I keep my medicine? Keep out of the reach of children. Store at room temperature between 20 and 25 degrees C (68 and 77 degrees F). Throw away any unused medicine after the expiration date. NOTE: This sheet is a summary. It may not cover all possible information. If you have questions about this medicine, talk to your doctor, pharmacist, or health care provider.  2021 Elsevier/Gold Standard (2018-03-27 09:19:36)

## 2020-08-15 ENCOUNTER — Other Ambulatory Visit: Payer: Self-pay | Admitting: Family Medicine

## 2020-08-15 DIAGNOSIS — I10 Essential (primary) hypertension: Secondary | ICD-10-CM

## 2020-08-15 NOTE — Telephone Encounter (Signed)
Requested medication (s) are due for refill today:  Yes  Requested medication (s) are on the active medication list:   Yes  Future visit scheduled:   No   Last ordered: 05/04/2020 #60, 2 refills  Clinic note:   Returned for dose clarification.    Not sure if she is taking 12.5 mg twice a day or 25 mg twice a day.   The order is for 25 mg twice a day.   Requested Prescriptions  Pending Prescriptions Disp Refills   carvedilol (COREG) 25 MG tablet [Pharmacy Med Name: Carvedilol 25 MG Oral Tablet] 60 tablet 0    Sig: TAKE 1 TABLET BY MOUTH TWICE DAILY WITH MEALS      Cardiovascular:  Beta Blockers Passed - 08/15/2020  7:29 AM      Passed - Last BP in normal range    BP Readings from Last 1 Encounters:  07/20/20 132/76          Passed - Last Heart Rate in normal range    Pulse Readings from Last 1 Encounters:  07/20/20 69          Passed - Valid encounter within last 6 months    Recent Outpatient Visits           2 months ago Neck pain   [facility], MD   3 months ago Essential hypertension   [facility], Ranell Patrick, MD   6 months ago Essential hypertension   [facility], Ranell Patrick, MD   6 months ago Rhinorrhea   [facility], Ranell Patrick, MD   1 year ago Erythema nodosum   [facility], Ranell Patrick, MD

## 2020-08-18 ENCOUNTER — Encounter: Payer: Self-pay | Admitting: Physical Therapy

## 2020-08-18 ENCOUNTER — Ambulatory Visit: Payer: 59 | Attending: Neurology | Admitting: Physical Therapy

## 2020-08-18 ENCOUNTER — Other Ambulatory Visit: Payer: Self-pay

## 2020-08-18 DIAGNOSIS — M542 Cervicalgia: Secondary | ICD-10-CM | POA: Diagnosis not present

## 2020-08-18 NOTE — Therapy (Signed)
[place], [place], [address] Phone: [phone]   Fax:  [phone]  Physical Therapy Evaluation  Patient Details  Name: Yolanda Jennings MRN: [id number] Date of Birth: [birth year] Referring Provider (PT): Sarina Ill, MD   Encounter Date: 08/18/2020   PT End of Session - 08/18/20 2009    Visit Number 1    Number of Visits 12    Date for PT Re-Evaluation 09/29/20    Authorization Type [employer], recheck FOTO by visit 6    PT Start Time 1717    PT Stop Time 1808    PT Time Calculation (min) 51 min    Activity Tolerance Patient tolerated treatment well    Behavior During Therapy [facility] for tasks assessed/performed           Past Medical History:  Diagnosis Date  . Allergy   . Hypertension   . Sickle cell trait ([place])   . Tobacco abuse    Stopped smoking 08/24/13    Past Surgical History:  Procedure Laterality Date  . BREAST BIOPSY Left 2011  . HERNIA REPAIR    . TUBAL LIGATION     2003    There were no vitals filed for this visit.    Subjective Assessment - 08/18/20 1641    Subjective Pt. is a 50 y/o female referred to PT for cervical myofascial pain syndrome. She reports onset of frontal and occipital headache symptoms in mid-September of last year-she initially thought symptoms could be associated with blood pressure elevation but symptoms persisted despite BP under control. She went to ED 04/05/20 and ultimately had head CT in November which was (-). Pt. saw Dr. Jaynee Eagles for assessment with current symptoms of cervical/suboccipital region pain suspected at myofascial in etiology. She reports tightness in her neck worse after work with pain in suboccipital region extending down into cervical paraspinals. She reports has had a couple of instances of pain radiating into her right shoulder and proximal arm with sidebending her head toward the right otherwise no UE radiating pain noted and no UE  parasthesias. X-rays revealed decreased cervical lordosis and degenerative changes.    Pertinent History PMH: HTN    Diagnostic tests CT scan, X-rays    Patient Stated Goals Get rid of headaches/neck pain    Currently in Pain? Yes    Pain Score 6     Pain Location Neck    Pain Orientation Upper;Mid;Right;Left;Posterior    Pain Descriptors / Indicators Aching   "toothache"   Pain Type Chronic pain    Pain Radiating Towards suboccipital region caudally down cervical paraspinals    Pain Onset More than a month ago    Pain Frequency Constant    Aggravating Factors  looking down, worse at end of day after work, activity/movement    Pain Relieving Factors rest              [facility] PT Assessment - 08/18/20 0001      Assessment   Medical Diagnosis Cervical myofascial pain syndrome    Referring Provider (PT) Sarina Ill, MD    Onset Date/Surgical Date 03/23/20   estimated   Hand Dominance Right    Prior Therapy none      Balance Screen   Has the patient fallen in the past 6 months No      Prior Function   Level of Independence Independent with basic ADLs    Vocation --   Ecologist  Overall Cognitive Status Within Functional Limits for tasks assessed      Observation/Other Assessments   Focus on Therapeutic Outcomes (FOTO)  57% function      Sensation   Light Touch Appears Intact   bilat. C3-T1 dermatomes     Posture/Postural Control   Posture/Postural Control Postural limitations    Postural Limitations Rounded Shoulders      Deep Tendon Reflexes   DTR Assessment Site Biceps;Brachioradialis;Triceps    Biceps DTR 2+    Brachioradialis DTR 2+    Triceps DTR 2+      ROM / Strength   AROM / PROM / Strength AROM;Strength      AROM   Overall AROM Comments Bilat. shoulder AROM grossly [facility]    AROM Assessment Site Cervical    Cervical Flexion 48   increased local neck pain   Cervical Extension 50    Cervical - Right Side Bend 50    Cervical - Left  Side Bend 50    Cervical - Right Rotation 70    Cervical - Left Rotation 75      Strength   Overall Strength Comments Bilat. UE strength grossly 5/5      Palpation   Spinal mobility pain with thoracic PAs thus did not assess to R2, hypomobility with mild TTP cervical CPAs mid to lower region    Palpation comment tight/TTP left>right suboccipitals, tight/TTP bilat. cervical paraspinals, tightness but no concordant pain bilat. upper trapezius and levator      Special Tests   Other special tests Spurling's and distraction (-)                      Objective measurements completed on examination: See above findings.       [facility] Adult PT Treatment/Exercise - 08/18/20 0001      Exercises   Exercises --   brief HEP review SNAGS and supine retractions with towel roll           Trigger Point Dry Needling - 08/18/20 0001    Consent Given? Yes    Education Handout Provided Yes    Muscles Treated Head and Neck Oblique capitus;Semispinalis capitus;Splenius capitus   bilateral   Dry Needling Comments needling in prone with 30-32 gauge 30 mm needles                PT Education - 08/18/20 2009    Education Details dry needling, HEP, POC    Person(s) Educated Patient    Methods Explanation;Demonstration;Verbal cues;Handout    Comprehension Verbalized understanding;Returned demonstration;Verbal cues required               PT Long Term Goals - 08/18/20 2024      PT LONG TERM GOAL #1   Title Independent with HEP    Time 6    Period Weeks    Status New    Target Date 09/29/20      PT LONG TERM GOAL #2   Title Improve FOTO outcome measure score to 74% or greater functional status    Baseline 57%    Time 6    Period Weeks    Status New    Target Date 09/29/20      PT LONG TERM GOAL #3   Title Return postural demos to help decrease cervical tension/associated pain    Time 6    Period Weeks    Status New    Target Date 09/29/20      PT LONG  TERM GOAL  #4   Title Perform work duties and tolerate cervical AROM for activities such as driving with neck pain intermittent and 2/10 or less at worst    Baseline constant pain 6/10 at eval    Time 6    Period Weeks    Status New    Target Date 09/29/20                  Plan - 08/18/20 2010    Clinical Impression Statement Eval findings consistent with referring diagnosis cervical myofascial pain. Pt. did report several recent instances of pain extending into right proximal arm with ipsilateral sidebending but Spurling's (-) and no reproduction of any radicular symptoms during assessment/no other findings suggestive of radiculopathy. Concordant pain noted with muscle palpation in cervical paraspinals and suboccipital region. Pt. would benefit from PT to help relieve pain and address associated functional limitations.    Personal Factors and Comorbidities Time since onset of injury/illness/exacerbation    Examination-Activity Limitations Lift;Reach Overhead;Carry    Examination-Participation Restrictions Driving;Occupation    Stability/Clinical Decision Making Stable/Uncomplicated    Clinical Decision Making Low    Rehab Potential Good    PT Frequency --   1-2x/week   PT Duration 6 weeks    PT Treatment/Interventions ADLs/Self Care Home Management;Cryotherapy;Traction;Electrical Stimulation;Moist Heat;Taping;Manual techniques;Neuromuscular re-education;Therapeutic activities;Therapeutic exercise;Patient/family education;Dry needling    PT Next Visit Plan review FOTO pt. report by visit 3, check response dry needling and continue as needed, STM/MFR, stretch upper traps and levator, review cervical retractions and SNAGS, add deep neck flexor and periscapular strengthening, add pec stretch, cervical distraction and suboccipital release    PT Home Exercise Plan Access code: [email or web address]    Consulted and Agree with Plan of Care Patient           Patient will benefit from skilled therapeutic  intervention in order to improve the following deficits and impairments:  Postural dysfunction,Pain,Increased fascial restricitons,Hypomobility,Decreased range of motion  Visit Diagnosis: Cervicalgia     Problem List Patient Active Problem List   Diagnosis Date Noted  . Cervical myofascial pain syndrome 07/20/2020  . Obesity (BMI 30.0-34.9) 08/22/2014  . Microcytic anemia 09/03/2013  . Headache 09/03/2013  . HTN (hypertension) 08/28/2013    Beaulah Dinning, PT, DPT 08/18/20 8:28 PM  [place] [facility] [address] [place], [place], [address] Phone: [phone]   Fax:  [phone]  Name: Yolanda Jennings MRN: [id number] Date of Birth: 14-Nov-1970

## 2020-09-13 ENCOUNTER — Other Ambulatory Visit: Payer: Self-pay | Admitting: Family Medicine

## 2020-09-13 ENCOUNTER — Ambulatory Visit: Payer: 59 | Admitting: Physical Therapy

## 2020-09-13 DIAGNOSIS — I10 Essential (primary) hypertension: Secondary | ICD-10-CM

## 2020-09-20 ENCOUNTER — Encounter: Payer: Self-pay | Admitting: Physical Therapy

## 2020-09-20 ENCOUNTER — Other Ambulatory Visit: Payer: Self-pay

## 2020-09-20 ENCOUNTER — Ambulatory Visit: Payer: 59 | Attending: Neurology | Admitting: Physical Therapy

## 2020-09-20 DIAGNOSIS — M542 Cervicalgia: Secondary | ICD-10-CM | POA: Diagnosis not present

## 2020-09-21 NOTE — Therapy (Signed)
[place], [place], [address] Phone: [phone]   Fax:  [phone]  Physical Therapy Treatment  Patient Details  Name: Yolanda Jennings MRN: [id number] Date of Birth: [birth year] Referring Provider (PT): Sarina Ill, MD   Encounter Date: 09/20/2020   PT End of Session - 09/20/20 1815    Visit Number 2    Number of Visits 12    Date for PT Re-Evaluation 09/29/20    Authorization Type [employer], recheck FOTO by visit 6    PT Start Time 1719    PT Stop Time 1819    PT Time Calculation (min) 60 min    Activity Tolerance Patient tolerated treatment well    Behavior During Therapy [facility] for tasks assessed/performed           Past Medical History:  Diagnosis Date  . Allergy   . Hypertension   . Sickle cell trait ([place])   . Tobacco abuse    Stopped smoking 08/24/13    Past Surgical History:  Procedure Laterality Date  . BREAST BIOPSY Left 2011  . HERNIA REPAIR    . TUBAL LIGATION     2003    There were no vitals filed for this visit.   Subjective Assessment - 09/20/20 1742    Subjective Pt. returns for first follow up visit since eval-had to cancel last visit and some delay with getting follow up appointment after eval due to work schedule. She reports had relief for a day or two after last visit but pain returned. Today pain 6/10 in same region as eval in cervical paraspinal region bilaterally. She has been doing HEP from eval.    Currently in Pain? Yes    Pain Score 6     Pain Location Neck    Pain Orientation Left;Right    Pain Descriptors / Indicators Aching    Pain Type Chronic pain    Pain Onset More than a month ago    Pain Frequency Intermittent    Aggravating Factors  was previously worse at end of work day but has been at times prior to work as well    Effect of Pain on Daily Activities limits activity and positional tolerance                             [facility]  Adult PT Treatment/Exercise - 09/21/20 0001      Neck Exercises: Seated   Neck Retraction Limitations HEP review-cervical retractions-given in supine at eval but practiced form in sitting      Moist Heat Therapy   Moist Heat Location Cervical      Manual Therapy   Manual Therapy Soft tissue mobilization;Manual Traction;Joint mobilization    Joint Mobilization cervical CPAs and UPAs both sides focus mid-cervical region   grade I-IV   Soft tissue mobilization STM and trigger point ischemic compression cervical paraspinals focus mid-cervical region    Manual Traction cervical manual traction and suboccipital release      Neck Exercises: Stretches   Other Neck Stretches HEP instruction and practice cervical flexion stretch-gentle retraction with upper cervical flexion with hand use            Trigger Point Dry Needling - 09/21/20 0001    Consent Given? Yes    Education Handout Provided Previously provided    Muscles Treated Head and Neck Semispinalis capitus;Splenius capitus;Cervical multifidi    Semispinalis capitus Response Twitch reponse elicited  PT Education - 09/20/20 1815    Education Details HEP practice/updates, Theracane use/instruction, FOTO patient report    Person(s) Educated Patient    Methods Explanation;Demonstration    Comprehension Verbalized understanding;Returned demonstration               PT Long Term Goals - 08/18/20 2024      PT LONG TERM GOAL #1   Title Independent with HEP    Time 6    Period Weeks    Status New    Target Date 09/29/20      PT LONG TERM GOAL #2   Title Improve FOTO outcome measure score to 74% or greater functional status    Baseline 57%    Time 6    Period Weeks    Status New    Target Date 09/29/20      PT LONG TERM GOAL #3   Title Return postural demos to help decrease cervical tension/associated pain    Time 6    Period Weeks    Status New    Target Date 09/29/20      PT LONG TERM GOAL #4    Title Perform work duties and tolerate cervical AROM for activities such as driving with neck pain intermittent and 2/10 or less at worst    Baseline constant pain 6/10 at eval    Time 6    Period Weeks    Status New    Target Date 09/29/20                 Plan - 09/20/20 1745    Clinical Impression Statement Pt. returns after delay in return from eval as noted in subjective. Temporary relief as noted in subjective with dry needling at eval but given symptom chronicity expect will take more time as well as exercise progression for consistent relief. Still with diffuse tightness and muscle spasm in cervical paraspinal region so included extensive manual focus to help address. Pt. able to tolerate addition gentle stretch into cervical flexion as noted per flowsheet otherwise minimal exercise progression today given manual focus.    Personal Factors and Comorbidities Time since onset of injury/illness/exacerbation    Examination-Activity Limitations Lift;Reach Overhead;Carry    Examination-Participation Restrictions Driving;Occupation    Stability/Clinical Decision Making Stable/Uncomplicated    Clinical Decision Making Low    Rehab Potential Good    PT Frequency --   1-2x/week   PT Duration 6 weeks    PT Treatment/Interventions ADLs/Self Care Home Management;Cryotherapy;Traction;Electrical Stimulation;Moist Heat;Taping;Manual techniques;Neuromuscular re-education;Therapeutic activities;Therapeutic exercise;Patient/family education;Dry needling    PT Next Visit Plan continue dry needling and manual as needed but as tolerated try progressing exercises with cervical stabilization, deep neck flexor training, postural training, periscaoular strengthening    PT Home Exercise Plan Access code: [email or web address]    Consulted and Agree with Plan of Care Patient           Patient will benefit from skilled therapeutic intervention in order to improve the following deficits and impairments:  Postural  dysfunction,Pain,Increased fascial restricitons,Hypomobility,Decreased range of motion  Visit Diagnosis: Cervicalgia     Problem List Patient Active Problem List   Diagnosis Date Noted  . Cervical myofascial pain syndrome 07/20/2020  . Obesity (BMI 30.0-34.9) 08/22/2014  . Microcytic anemia 09/03/2013  . Headache 09/03/2013  . HTN (hypertension) 08/28/2013    Beaulah Dinning, PT, DPT 09/21/20 8:30 AM  [facility] [address] [place], [place], [address] Phone: [phone]   Fax:  [phone]  Name: Elsye Mccollister MRN: [id number] Date of Birth: 01/01/1971

## 2020-09-27 ENCOUNTER — Telehealth: Payer: Self-pay | Admitting: Physical Therapy

## 2020-09-27 ENCOUNTER — Ambulatory Visit: Payer: 59 | Admitting: Physical Therapy

## 2020-09-27 NOTE — Telephone Encounter (Signed)
Attempted to call patient regarding no show for 4:30 therapy appointment. Left voicemail including reminder for next scheduled visit and request to contact clinic if needing to cancel or reschedule.

## 2020-10-11 ENCOUNTER — Ambulatory Visit: Payer: 59 | Admitting: Physical Therapy

## 2020-10-11 NOTE — Therapy (Signed)
[place], [place], [address] Phone: [phone]   Fax:  [phone]  Physical Therapy Treatment/Discharge  Patient Details  Name: Yolanda Jennings MRN: [id number] Date of Birth: 10-31-1970 Referring Provider (PT): Sarina Ill, MD   Encounter Date: 09/20/2020    Past Medical History:  Diagnosis Date  . Allergy   . Hypertension   . Sickle cell trait ([place])   . Tobacco abuse    Stopped smoking 08/24/13    Past Surgical History:  Procedure Laterality Date  . BREAST BIOPSY Left 2011  . HERNIA REPAIR    . TUBAL LIGATION     2003    There were no vitals filed for this visit.                                   PT Long Term Goals - 08/18/20 2024      PT LONG TERM GOAL #1   Title Independent with HEP    Time 6    Period Weeks    Status New    Target Date 09/29/20      PT LONG TERM GOAL #2   Title Improve FOTO outcome measure score to 74% or greater functional status    Baseline 57%    Time 6    Period Weeks    Status New    Target Date 09/29/20      PT LONG TERM GOAL #3   Title Return postural demos to help decrease cervical tension/associated pain    Time 6    Period Weeks    Status New    Target Date 09/29/20      PT LONG TERM GOAL #4   Title Perform work duties and tolerate cervical AROM for activities such as driving with neck pain intermittent and 2/10 or less at worst    Baseline constant pain 6/10 at eval    Time 6    Period Weeks    Status New    Target Date 09/29/20                  Patient will benefit from skilled therapeutic intervention in order to improve the following deficits and impairments:  Postural dysfunction,Pain,Increased fascial restricitons,Hypomobility,Decreased range of motion  Visit Diagnosis: Cervicalgia     Problem List Patient Active Problem List   Diagnosis Date Noted  . Cervical myofascial pain  syndrome 07/20/2020  . Obesity (BMI 30.0-34.9) 08/22/2014  . Microcytic anemia 09/03/2013  . Headache 09/03/2013  . HTN (hypertension) 08/28/2013     PHYSICAL THERAPY DISCHARGE SUMMARY  Visits from Start of Care: 2  Current functional level related to goals / functional outcomes: Patient contacted clinic and requested to cancel remaining therapy appointments   Remaining deficits: Current status unknown-no show for appointment after last attended visit then cancelled remaining visits   Education / Equipment: HEP Plan: Patient agrees to discharge.  Patient goals were not met. Patient is being discharged due to the patient's request.  ?????           Beaulah Dinning, PT, DPT 10/11/20 12:37 PM       [place] [facility] [address] [place], [place], [address] Phone: [phone]   Fax:  [phone]  Name: Latina Frank MRN: [id number] Date of Birth: 30-Aug-1970

## 2020-10-12 ENCOUNTER — Other Ambulatory Visit: Payer: Self-pay | Admitting: Family Medicine

## 2020-10-12 DIAGNOSIS — I10 Essential (primary) hypertension: Secondary | ICD-10-CM

## 2020-10-18 ENCOUNTER — Encounter: Payer: 59 | Admitting: Physical Therapy

## 2020-12-07 ENCOUNTER — Ambulatory Visit (INDEPENDENT_AMBULATORY_CARE_PROVIDER_SITE_OTHER): Payer: 59 | Admitting: Family Medicine

## 2020-12-07 ENCOUNTER — Encounter: Payer: Self-pay | Admitting: Family Medicine

## 2020-12-07 ENCOUNTER — Other Ambulatory Visit: Payer: Self-pay

## 2020-12-07 VITALS — BP 136/82 | HR 86 | Temp 98.4°F | Wt 183.6 lb

## 2020-12-07 DIAGNOSIS — Z1329 Encounter for screening for other suspected endocrine disorder: Secondary | ICD-10-CM

## 2020-12-07 DIAGNOSIS — M7918 Myalgia, other site: Secondary | ICD-10-CM | POA: Diagnosis not present

## 2020-12-07 DIAGNOSIS — E876 Hypokalemia: Secondary | ICD-10-CM

## 2020-12-07 DIAGNOSIS — I10 Essential (primary) hypertension: Secondary | ICD-10-CM | POA: Diagnosis not present

## 2020-12-07 MED ORDER — POTASSIUM CHLORIDE CRYS ER 20 MEQ PO TBCR: 40.0000 meq | EXTENDED_RELEASE_TABLET | Freq: Every day | ORAL | 5 refills | Status: DC

## 2020-12-07 MED ORDER — AMLODIPINE BESYLATE 10 MG PO TABS: 10.0000 mg | ORAL_TABLET | Freq: Every day | ORAL | 1 refills | Status: DC

## 2020-12-07 MED ORDER — CARVEDILOL 25 MG PO TABS: 25.0000 mg | ORAL_TABLET | Freq: Two times a day (BID) | ORAL | 1 refills | Status: DC

## 2020-12-07 MED ORDER — CLONIDINE HCL 0.1 MG PO TABS: 0.1000 mg | ORAL_TABLET | Freq: Two times a day (BID) | ORAL | 1 refills | Status: DC

## 2020-12-07 NOTE — Progress Notes (Signed)
Subjective:  Patient ID: Marylynn Rigdon, female    DOB: 09-27-1970  Age: 50 y.o. MRN: [id number]  CC:  Chief Complaint  Patient presents with  . Hypertension    HPI Aylin Rhoads presents for   Hypertension: Follow-up from December.  Higher dose of carvedilol is working well at that time with home readings in the 130s over 70s to 80s. On potassium 27mq qd, along with norvasc, carvedilol.  Home readings: no recent. 128/78.  BP Readings from Last 3 Encounters:  12/07/20 136/82  07/20/20 132/76  06/08/20 127/80   Lab Results  Component Value Date   CREATININE 1.09 (H) 05/04/2020  . Lab Results  Component Value Date   K 4.3 05/04/2020    Neck pain/headache: Discussed in December, likely component of tension headache with degenerative changes noted in cervical spine.  Robaxin prescribed, evaluated by neurology, thought to have cervical myofascial pain syndrome.  Outpatient physical therapy completed with dry needling.  Pain in neck better. Feels like better when BP controlled. HA resolved.   History Patient Active Problem List   Diagnosis Date Noted  . Cervical myofascial pain syndrome 07/20/2020  . Obesity (BMI 30.0-34.9) 08/22/2014  . Microcytic anemia 09/03/2013  . Headache 09/03/2013  . HTN (hypertension) 08/28/2013   Past Medical History:  Diagnosis Date  . Allergy   . Hypertension   . [place]ickle cell trait ([place]   . Tobacco abuse    Stopped smoking 08/24/13   Past Surgical History:  Procedure Laterality Date  . BREAST BIOPSY Left 2011  . HERNIA REPAIR    . TUBAL LIGATION     2003   Allergies  Allergen Reactions  . Ace Inhibitors Swelling and Other (See Comments)    Reaction:  Tongue swelling   . Lisinopril Swelling and Other (See Comments)    Reaction:  Tongue swelling    Prior to Admission medications   Medication Sig Start Date End Date Taking? Authorizing Provider  amLODipine (NORVASC) 10 MG tablet Take 1 tablet (10 mg total) by mouth  daily. 05/04/20  Yes GWendie Agreste MD  carvedilol (COREG) 25 MG tablet TAKE 1 TABLET BY MOUTH TWICE DAILY WITH MEALS 10/12/20  Yes GWendie Agreste MD  cloNIDine (CATAPRES) 0.1 MG tablet Take 1 tablet (0.1 mg total) by mouth 2 (two) times daily. 05/04/20  Yes GWendie Agreste MD  cyclobenzaprine (FLEXERIL) 10 MG tablet Take 1 tablet (10 mg total) by mouth at bedtime. 07/20/20  Yes ASarina IllB, MD  ipratropium (ATROVENT) 0.06 % nasal spray Place 1-2 sprays into both nostrils 4 (four) times daily. As needed for nasal congestion 02/05/20  Yes GWendie Agreste MD  methocarbamol (ROBAXIN) 500 MG tablet Take 1 tablet (500 mg total) by mouth every 8 (eight) hours as needed for muscle spasms (start at bedtime initially). 06/08/20  Yes GWendie Agreste MD  potassium chloride SA (KLOR-CON) 20 MEQ tablet Take 2 tablets (40 mEq total) by mouth daily. 05/04/20  Yes GWendie Agreste MD   Social History   Socioeconomic History  . Marital status: Single    Spouse name: Not on file  . Number of children: Not on file  . Years of education: Not on file  . Highest education level: Not on file  Occupational History  . Occupation: FFreight forwarder Tobacco Use  . Smoking status: Former Smoker    Years: 20.00    Quit date: 08/24/2013    Years since quitting: 7.2  . Smokeless  tobacco: Never Used  Vaping Use  . Vaping Use: Never used  Substance and Sexual Activity  . Alcohol use: Yes    Alcohol/week: 0.0 standard drinks    Comment: occasional  . Drug use: No  . Sexual activity: Yes  Other Topics Concern  . Not on file  Social History Narrative   Marital status: Single      Children: 2 children; no grandchildren.      Lives: with one child      Employment: Freight forwarder      Tobacco: quit 08/2013.       Alcohol:  Socially.      Exercise: Yes      Education: [employer].      Right handed   Caffeine: coffee in the winter, 1-2 cups/day   Social Determinants of Health    Financial Resource Strain: Not on file  Food Insecurity: Not on file  Transportation Needs: Not on file  Physical Activity: Not on file  Stress: Not on file  Social Connections: Not on file  Intimate Partner Violence: Not on file    Review of Systems  Constitutional: Negative for fatigue and unexpected weight change.  Respiratory: Negative for chest tightness and shortness of breath.   Cardiovascular: Negative for chest pain, palpitations and leg swelling.  Gastrointestinal: Negative for abdominal pain and blood in stool.  Neurological: Negative for dizziness, syncope, light-headedness and headaches.     Objective:   Vitals:   12/07/20 1437  BP: 136/82  Pulse: 86  Temp: 98.4 F (36.9 C)  TempSrc: Temporal  SpO2: 98%  Weight: 183 lb 9.6 oz (83.3 kg)     Physical Exam Vitals reviewed.  Constitutional:      Appearance: She is well-developed.  HENT:     Head: Normocephalic and atraumatic.  Eyes:     Conjunctiva/sclera: Conjunctivae normal.     Pupils: Pupils are equal, round, and reactive to light.  Neck:     Vascular: No carotid bruit.  Cardiovascular:     Rate and Rhythm: Normal rate and regular rhythm.     Heart sounds: Normal heart sounds.  Pulmonary:     Effort: Pulmonary effort is normal.     Breath sounds: Normal breath sounds.  Abdominal:     Palpations: Abdomen is soft. There is no pulsatile mass.     Tenderness: There is no abdominal tenderness.  Musculoskeletal:     Cervical back: Normal. No swelling or deformity. Normal range of motion (pain free rom. nontender .).  Skin:    General: Skin is warm and dry.  Neurological:     Mental Status: She is alert and oriented to person, place, and time.  Psychiatric:        Behavior: Behavior normal.    22 minutes spent during visit, including chart review, counseling and assimilation of information, exam, discussion of plan, and chart completion.    Assessment & Plan:  Tiki Tucciarone is a 50  y.o. female . Essential hypertension - Plan: Lipid panel, Comp Met (CMET), CBC with Differential/Platelet, TSH  -  Stable, tolerating current regimen. Medications refilled. Labs pending as above.   Screening for thyroid disorder - Plan: TSH  Cervical myofascial pain syndrome  - improved. rtc precautions if recurs.   No orders of the defined types were placed in this encounter.  Patient Instructions  No med changes at this time. Follow up if neck pain/headaches return. Thanks for coming in today.      Signed,  [name], MD [facility]

## 2020-12-07 NOTE — Patient Instructions (Signed)
No med changes at this time. Follow up if neck pain/headaches return. Thanks for coming in today.

## 2020-12-08 LAB — CBC WITH DIFFERENTIAL/PLATELET
Basophils Absolute: 0 10*3/uL (ref 0.0–0.1)
Basophils Relative: 0.8 % (ref 0.0–3.0)
Eosinophils Absolute: 0.2 10*3/uL (ref 0.0–0.7)
Eosinophils Relative: 3.9 % (ref 0.0–5.0)
HCT: 37.3 % (ref 36.0–46.0)
Hemoglobin: 12 g/dL (ref 12.0–15.0)
Lymphocytes Relative: 26 % (ref 12.0–46.0)
Lymphs Abs: 1.5 10*3/uL (ref 0.7–4.0)
MCHC: 32.1 g/dL (ref 30.0–36.0)
MCV: 81.5 fl (ref 78.0–100.0)
Monocytes Absolute: 0.6 10*3/uL (ref 0.1–1.0)
Monocytes Relative: 10.3 % (ref 3.0–12.0)
Neutro Abs: 3.3 10*3/uL (ref 1.4–7.7)
Neutrophils Relative %: 59 % (ref 43.0–77.0)
Platelets: 307 10*3/uL (ref 150.0–400.0)
RBC: 4.58 Mil/uL (ref 3.87–5.11)
RDW: 14.2 % (ref 11.5–15.5)
WBC: 5.6 10*3/uL (ref 4.0–10.5)

## 2020-12-08 LAB — TSH: TSH: 1.06 u[IU]/mL (ref 0.35–4.50)

## 2020-12-09 LAB — COMPREHENSIVE METABOLIC PANEL
ALT: 10 U/L (ref 0–35)
AST: 15 U/L (ref 0–37)
Albumin: 4.1 g/dL (ref 3.5–5.2)
Alkaline Phosphatase: 90 U/L (ref 39–117)
BUN: 15 mg/dL (ref 6–23)
CO2: 19 mEq/L (ref 19–32)
Calcium: 9.5 mg/dL (ref 8.4–10.5)
Chloride: 106 mEq/L (ref 96–112)
Creatinine, Ser: 1.01 mg/dL (ref 0.40–1.20)
GFR: 64.73 mL/min (ref >60.00)
Glucose, Bld: 86 mg/dL (ref 70–99)
Potassium: 3.9 mEq/L (ref 3.5–5.1)
Sodium: 140 mEq/L (ref 135–145)
Total Bilirubin: 0.3 mg/dL (ref 0.2–1.2)
Total Protein: 7.6 g/dL (ref 6.0–8.3)

## 2020-12-09 LAB — LIPID PANEL
Cholesterol: 179 mg/dL (ref 0–200)
HDL: 68.8 mg/dL (ref >39.00)
LDL Cholesterol: 81 mg/dL (ref 0–99)
NonHDL: 110.03
Total CHOL/HDL Ratio: 3
Triglycerides: 143 mg/dL (ref 0.0–149.0)
VLDL: 28.6 mg/dL (ref 0.0–40.0)

## 2021-04-20 ENCOUNTER — Ambulatory Visit (INDEPENDENT_AMBULATORY_CARE_PROVIDER_SITE_OTHER): Payer: 59 | Admitting: Family Medicine

## 2021-04-20 ENCOUNTER — Encounter: Payer: Self-pay | Admitting: Family Medicine

## 2021-04-20 ENCOUNTER — Other Ambulatory Visit: Payer: Self-pay

## 2021-04-20 VITALS — BP 131/70 | HR 73 | Temp 98.2°F | Resp 18 | Ht 65.0 in | Wt 178.2 lb

## 2021-04-20 DIAGNOSIS — N939 Abnormal uterine and vaginal bleeding, unspecified: Secondary | ICD-10-CM | POA: Diagnosis not present

## 2021-04-20 LAB — POCT URINE PREGNANCY: Preg Test, Ur: NEGATIVE

## 2021-04-20 NOTE — Patient Instructions (Addendum)
I will refer you to gynecology but possible menopause as cause of abnormal bleeding. Exam was reassuring today.   Return to the clinic or go to the nearest emergency room if any of your symptoms worsen or new symptoms occur.  Abnormal Uterine Bleeding Abnormal uterine bleeding is unusual bleeding from the uterus. It includes bleeding after sex, or bleeding or spotting between menstrual periods. It may also include bleeding that is heavier than normal, menstrual periods that last longer than usual, or bleeding that occurs after menopause. Abnormal uterine bleeding can affect teenagers, women in their reproductive years, pregnant women, and women who have reached menopause. Common causes of abnormal uterine bleeding include: Pregnancy. Growths of tissue (polyps). Benign tumors or growths in the uterus (fibroids). These are not cancer. Infection. Cancer. Too much or too little of some hormones in the body (hormonal imbalances). Any type of abnormal bleeding should be checked by a health care provider. Many cases are minor and simple to treat, but others may be more serious. Treatment will depend on the cause and severity of the bleeding. Follow these instructions at home: Medicines Take over-the-counter and prescription medicines only as told by your health care provider. Tell your health care provider about other medicines that you take. You may be asked to stop taking aspirin or medicines that contain aspirin. These medicines can make bleeding worse. If you were prescribed iron pills, take them as told by your health care provider. Iron pills help to replace iron that your body loses because of this condition. Managing constipation In cases of severe bleeding, you may be asked to increase your iron intake to treat anemia. This may cause constipation. To prevent or treat constipation, you may need to: Drink enough fluid to keep your urine pale yellow. Take over-the-counter or prescription  medicines. Eat foods that are high in fiber, such as beans, whole grains, and fresh fruits and vegetables. Limit foods that are high in fat and processed sugars, such as fried or sweet foods. General instructions Monitor your condition for any changes. Do not use tampons, douche, or have sex until your health care provider says these things are okay. Change your pads often. Get regular exams. This includes pelvic exams and cervical cancer screenings. It is up to you to get the results of any tests that are done. Ask your health care provider, or the department that is doing the tests, when your results will be ready. Keep all follow-up visits as told by your health care provider. This is important. Contact a health care provider if you: Have bleeding that lasts for more than 1 week. Feel dizzy at times. Feel nauseous or you vomit. Feel light-headed or weak. Notice any other changes that show that your condition is getting worse. Get help right away if you: Pass out. Have bleeding that soaks through a pad every hour. Have pain in the abdomen. Have a fever or chills. Become sweaty or weak. Pass large blood clots from your vagina. Summary Abnormal uterine bleeding is unusual bleeding from the uterus. Any type of abnormal bleeding should be evaluated by a health care provider. Many cases are minor and simple to treat, but others may be more serious. Treatment will depend on the cause of the bleeding. Get help right away if you pass out, you have bleeding that soaks through a pad every hour, or you pass large blood clots from your vagina. This information is not intended to replace advice given to you by your health care provider.  Make sure you discuss any questions you have with your health care provider. Document Revised: 03/02/2020 Document Reviewed: 04/28/2019 Elsevier Patient Education  2022 [employer].    If you have lab work done today you will be contacted with your lab  results within the next 2 weeks.  If you have not heard from [place] then please contact us. The fastest way to get your results is to register for My Chart.   IF you received an x-ray today, you will receive an invoice from [facility]. Please contact [facility] at [phone] with questions or concerns regarding your invoice.   IF you received labwork today, you will receive an invoice from [facility]. Please contact [facility] at [phone] with questions or concerns regarding your invoice.   Our billing staff will not be able to assist you with questions regarding bills from these companies.  You will be contacted with the lab results as soon as they are available. The fastest way to get your results is to activate your My Chart account. Instructions are located on the last page of this paperwork. If you have not heard from [place] regarding the results in 2 weeks, please contact this office.

## 2021-04-20 NOTE — Progress Notes (Signed)
Subjective:  Patient ID: Yolanda Jennings, female    DOB: February 26, 1971  Age: 50 y.o. MRN: [id number]  CC:  Chief Complaint  Patient presents with   Menstrual Problem    Patient states she is here because she she has had a menstrual period for about 30days . Patient states she is nervous because her mom had cervical cancer. Also some rectum bleeding.    HPI Yolanda Jennings presents for   Abnormal Uterine bleeding: Started 9/13 - typical bleeding initially for usual cycle (usually moderate for 5 days, then stops).  Lasted longer - 27 days with some clots at times - similar amount of bleeding, just like prior menses but longer. Min cramping. Bleeding stopped 2 days ago.  Prior menses normal - every month, 5 days.  Concerned about bleeding, as mother had cervical cancer in her 52's.  Last pp in 01/2019 - negative.  Feeling well otherwise - no lightheadedness/dizziness.  Sexually active. Hx of tubal ligation 21 yrs ago.  No recent pain with intercourse.  [id number].     Had hemorrhoid bleed during same time, but that has stopped. No rectal pain currently.      History Patient Active Problem List   Diagnosis Date Noted   Cervical myofascial pain syndrome 07/20/2020   Obesity (BMI 30.0-34.9) 08/22/2014   Microcytic anemia 09/03/2013   Headache 09/03/2013   HTN (hypertension) 08/28/2013   Past Medical History:  Diagnosis Date   Allergy    Hypertension    Sickle cell trait ([place])    Tobacco abuse    Stopped smoking 08/24/13   Past Surgical History:  Procedure Laterality Date   BREAST BIOPSY Left 2011   HERNIA REPAIR     TUBAL LIGATION     2003   Allergies  Allergen Reactions   Ace Inhibitors Swelling and Other (See Comments)    Reaction:  Tongue swelling    Lisinopril Swelling and Other (See Comments)    Reaction:  Tongue swelling    Prior to Admission medications   Medication Sig Start Date End Date Taking? Authorizing Provider  amLODipine (NORVASC) 10 MG tablet  Take 1 tablet (10 mg total) by mouth daily. 12/07/20  Yes [name], MD  carvedilol (COREG) 25 MG tablet Take 1 tablet (25 mg total) by mouth 2 (two) times daily with a meal. 12/07/20  Yes [name], MD  cloNIDine (CATAPRES) 0.1 MG tablet Take 1 tablet (0.1 mg total) by mouth 2 (two) times daily. 12/07/20  Yes [name], MD  cyclobenzaprine (FLEXERIL) 10 MG tablet Take 1 tablet (10 mg total) by mouth at bedtime. 07/20/20  Yes Sarina Ill B, MD  ipratropium (ATROVENT) 0.06 % nasal spray Place 1-2 sprays into both nostrils 4 (four) times daily. As needed for nasal congestion 02/05/20  Yes [name], MD  methocarbamol (ROBAXIN) 500 MG tablet Take 1 tablet (500 mg total) by mouth every 8 (eight) hours as needed for muscle spasms (start at bedtime initially). 06/08/20  Yes [name], MD  potassium chloride SA (KLOR-CON) 20 MEQ tablet Take 2 tablets (40 mEq total) by mouth daily. 12/07/20  Yes [name], MD   Social History   Socioeconomic History   Marital status: Single    Spouse name: Not on file   Number of children: Not on file   Years of education: Not on file   Highest education level: Not on file  Occupational History   Occupation: Freight forwarder  Tobacco  Use   Smoking status: Former    Years: 20.00    Types: Cigarettes    Quit date: 08/24/2013    Years since quitting: 7.6   Smokeless tobacco: Never  Vaping Use   Vaping Use: Never used  Substance and Sexual Activity   Alcohol use: Yes    Alcohol/week: 0.0 standard drinks    Comment: occasional   Drug use: No   Sexual activity: Yes  Other Topics Concern   Not on file  Social History Narrative   Marital status: Single      Children: 2 children; no grandchildren.      Lives: with one child      Employment: Freight forwarder      Tobacco: quit 08/2013.       Alcohol:  Socially.      Exercise: Yes      Education: [employer].      Right handed   Caffeine: coffee in the winter, 1-2  cups/day   Social Determinants of Health   Financial Resource Strain: Not on file  Food Insecurity: Not on file  Transportation Needs: Not on file  Physical Activity: Not on file  Stress: Not on file  Social Connections: Not on file  Intimate Partner Violence: Not on file    Review of Systems  Genitourinary:  Positive for menstrual problem and vaginal bleeding. Negative for difficulty urinating and vaginal discharge.  Neurological:  Negative for dizziness and light-headedness.  Per HPI.   Objective:   Vitals:   04/20/21 1559  BP: 131/70  Pulse: 73  Resp: 18  Temp: 98.2 F (36.8 C)  TempSrc: Temporal  SpO2: 99%  Weight: 178 lb 3.2 oz (80.8 kg)  Height: 5\' 5"  (1.651 m)     Physical Exam Exam conducted with a chaperone present Jerene Pitch).  Constitutional:      General: She is not in acute distress.    Appearance: Normal appearance. She is well-developed.  HENT:     Head: Normocephalic and atraumatic.  Cardiovascular:     Rate and Rhythm: Normal rate.  Pulmonary:     Effort: Pulmonary effort is normal.  Genitourinary:    Exam position: Lithotomy position.     Labia:        Right: No rash, tenderness, lesion or injury.        Left: No rash, tenderness, lesion or injury.      Vagina: No signs of injury and foreign body. No vaginal discharge, erythema, tenderness, bleeding or lesions.     Cervix: Cervical bleeding: minimal at os. no clots..     Uterus: Not tender.      Adnexa: Right adnexa normal.       Right: No tenderness.         Left: No tenderness.    Neurological:     Mental Status: She is alert and oriented to person, place, and time.  Psychiatric:        Mood and Affect: Mood normal.     Results for orders placed or performed in visit on 04/20/21  POCT urine pregnancy  Result Value Ref Range   Preg Test, Ur Negative Negative     Assessment & Plan:  Yolanda Jennings is a 50 y.o. female . Abnormal uterine bleeding - Plan: POCT urine  pregnancy Abnormal uterine bleeding likely perimenopausal.  No orders of the defined types were placed in this encounter.  Patient Instructions   I will refer you to gynecology but possible menopause as cause  of abnormal bleeding. Exam was reassuring today.   Return to the clinic or go to the nearest emergency room if any of your symptoms worsen or new symptoms occur.  Abnormal Uterine Bleeding Abnormal uterine bleeding is unusual bleeding from the uterus. It includes bleeding after sex, or bleeding or spotting between menstrual periods. It may also include bleeding that is heavier than normal, menstrual periods that last longer than usual, or bleeding that occurs after menopause. Abnormal uterine bleeding can affect teenagers, women in their reproductive years, pregnant women, and women who have reached menopause. Common causes of abnormal uterine bleeding include: Pregnancy. Growths of tissue (polyps). Benign tumors or growths in the uterus (fibroids). These are not cancer. Infection. Cancer. Too much or too little of some hormones in the body (hormonal imbalances). Any type of abnormal bleeding should be checked by a health care provider. Many cases are minor and simple to treat, but others may be more serious. Treatment will depend on the cause and severity of the bleeding. Follow these instructions at home: Medicines Take over-the-counter and prescription medicines only as told by your health care provider. Tell your health care provider about other medicines that you take. You may be asked to stop taking aspirin or medicines that contain aspirin. These medicines can make bleeding worse. If you were prescribed iron pills, take them as told by your health care provider. Iron pills help to replace iron that your body loses because of this condition. Managing constipation In cases of severe bleeding, you may be asked to increase your iron intake to treat anemia. This may cause  constipation. To prevent or treat constipation, you may need to: Drink enough fluid to keep your urine pale yellow. Take over-the-counter or prescription medicines. Eat foods that are high in fiber, such as beans, whole grains, and fresh fruits and vegetables. Limit foods that are high in fat and processed sugars, such as fried or sweet foods. General instructions Monitor your condition for any changes. Do not use tampons, douche, or have sex until your health care provider says these things are okay. Change your pads often. Get regular exams. This includes pelvic exams and cervical cancer screenings. It is up to you to get the results of any tests that are done. Ask your health care provider, or the department that is doing the tests, when your results will be ready. Keep all follow-up visits as told by your health care provider. This is important. Contact a health care provider if you: Have bleeding that lasts for more than 1 week. Feel dizzy at times. Feel nauseous or you vomit. Feel light-headed or weak. Notice any other changes that show that your condition is getting worse. Get help right away if you: Pass out. Have bleeding that soaks through a pad every hour. Have pain in the abdomen. Have a fever or chills. Become sweaty or weak. Pass large blood clots from your vagina. Summary Abnormal uterine bleeding is unusual bleeding from the uterus. Any type of abnormal bleeding should be evaluated by a health care provider. Many cases are minor and simple to treat, but others may be more serious. Treatment will depend on the cause of the bleeding. Get help right away if you pass out, you have bleeding that soaks through a pad every hour, or you pass large blood clots from your vagina. This information is not intended to replace advice given to you by your health care provider. Make sure you discuss any questions you have with your health  care provider. Document Revised: 03/02/2020  Document Reviewed: 04/28/2019 Elsevier Patient Education  2022 [employer].    If you have lab work done today you will be contacted with your lab results within the next 2 weeks.  If you have not heard from [place] then please contact us. The fastest way to get your results is to register for My Chart.   IF you received an x-ray today, you will receive an invoice from [facility]. Please contact [facility] at [phone] with questions or concerns regarding your invoice.   IF you received labwork today, you will receive an invoice from [facility]. Please contact [facility] at [phone] with questions or concerns regarding your invoice.   Our billing staff will not be able to assist you with questions regarding bills from these companies.  You will be contacted with the lab results as soon as they are available. The fastest way to get your results is to activate your My Chart account. Instructions are located on the last page of this paperwork. If you have not heard from [place] regarding the results in 2 weeks, please contact this office.       Signed,   [name], MD [place] 04/20/21 5:54 PM

## 2021-04-27 LAB — HM MAMMOGRAPHY

## 2021-04-28 ENCOUNTER — Encounter: Payer: Self-pay | Admitting: Family Medicine

## 2021-05-11 LAB — HM MAMMOGRAPHY

## 2021-05-13 ENCOUNTER — Emergency Department (HOSPITAL_COMMUNITY)
Admission: EM | Admit: 2021-05-13 | Discharge: 2021-05-14 | Disposition: A | Discharge status: Home / Self Care | Payer: 59 | Attending: Emergency Medicine | Admitting: Emergency Medicine

## 2021-05-13 ENCOUNTER — Encounter (HOSPITAL_COMMUNITY): Payer: Self-pay

## 2021-05-13 ENCOUNTER — Other Ambulatory Visit: Payer: Self-pay

## 2021-05-13 ENCOUNTER — Emergency Department (HOSPITAL_COMMUNITY): Payer: 59

## 2021-05-13 DIAGNOSIS — R002 Palpitations: Secondary | ICD-10-CM | POA: Insufficient documentation

## 2021-05-13 DIAGNOSIS — Z79899 Other long term (current) drug therapy: Secondary | ICD-10-CM | POA: Insufficient documentation

## 2021-05-13 DIAGNOSIS — I10 Essential (primary) hypertension: Secondary | ICD-10-CM | POA: Insufficient documentation

## 2021-05-13 DIAGNOSIS — Z87891 Personal history of nicotine dependence: Secondary | ICD-10-CM | POA: Insufficient documentation

## 2021-05-13 LAB — CBC WITH DIFFERENTIAL/PLATELET
Abs Immature Granulocytes: 0.02 10*3/uL (ref 0.00–0.07)
Basophils Absolute: 0 10*3/uL (ref 0.0–0.1)
Basophils Relative: 1 %
Eosinophils Absolute: 0.1 10*3/uL (ref 0.0–0.5)
Eosinophils Relative: 2 %
HCT: 34.7 % — ABNORMAL LOW (ref 36.0–46.0)
Hemoglobin: 11.8 g/dL — ABNORMAL LOW (ref 12.0–15.0)
Immature Granulocytes: 0 %
Lymphocytes Relative: 25 %
Lymphs Abs: 2.1 10*3/uL (ref 0.7–4.0)
MCH: 27.1 pg (ref 26.0–34.0)
MCHC: 34 g/dL (ref 30.0–36.0)
MCV: 79.8 fL — ABNORMAL LOW (ref 80.0–100.0)
Monocytes Absolute: 0.7 10*3/uL (ref 0.1–1.0)
Monocytes Relative: 9 %
Neutro Abs: 5.3 10*3/uL (ref 1.7–7.7)
Neutrophils Relative %: 63 %
Platelets: 322 10*3/uL (ref 150–400)
RBC: 4.35 MIL/uL (ref 3.87–5.11)
RDW: 14.6 % (ref 11.5–15.5)
WBC: 8.3 10*3/uL (ref 4.0–10.5)
nRBC: 0 % (ref 0.0–0.2)

## 2021-05-13 LAB — BASIC METABOLIC PANEL
Anion gap: 8 (ref 5–15)
BUN: 13 mg/dL (ref 6–20)
CO2: 24 mmol/L (ref 22–32)
Calcium: 9.3 mg/dL (ref 8.9–10.3)
Chloride: 105 mmol/L (ref 98–111)
Creatinine, Ser: 0.95 mg/dL (ref 0.44–1.00)
GFR, Estimated: >60 mL/min (ref >60)
Glucose, Bld: 151 mg/dL — ABNORMAL HIGH (ref 70–99)
Potassium: 3.7 mmol/L (ref 3.5–5.1)
Sodium: 137 mmol/L (ref 135–145)

## 2021-05-13 NOTE — ED Triage Notes (Signed)
Pt reports palpitations since yesterday, hx of same states her K level was low last time. Pt denies any chest pain. Pt a.o

## 2021-05-13 NOTE — ED Provider Notes (Signed)
Emergency Medicine Provider Triage Evaluation Note  Yolanda Jennings , a 50 y.o. female  was evaluated in triage.  Pt complains of heart palpitations for the last 2 days, with some shortness of breath, feeling of lightheadedness, patient reports that her palpitations have been around every few minutes, she has had something similar happen in the past, and had a low potassium at the time.  Patient has not been evaluated by cardiology, no Holter monitor, no formal diagnosis.  No chest pain at this time, patient denies any other medical conditions, htn meds on chart.  Review of Systems  Positive: As above Negative: As above  Physical Exam  BP (!) 148/87 (BP Location: Right Arm)   Pulse 90   Temp 98.1 F (36.7 C) (Oral)   Resp 18   Ht 5\' 4"  (1.626 m)   Wt 80.7 kg   SpO2 100%   BMI 30.55 kg/m  Gen:   Awake, no distress   Resp:  Normal effort  MSK:   Moves extremities without difficulty  Other:  Normal heart rate / rhythm during my exam  Medical Decision Making  Medically screening exam initiated at 10:06 PM.  Appropriate orders placed.  Bonni Neuser was informed that the remainder of the evaluation will be completed by another provider, this initial triage assessment does not replace that evaluation, and the importance of remaining in the ED until their evaluation is complete.  palpitations   Dorien Chihuahua 05/13/21 2208    Noemi Chapel, MD 05/19/21 1452

## 2021-05-14 LAB — TROPONIN I (HIGH SENSITIVITY): Troponin I (High Sensitivity): 5 ng/L (ref <18)

## 2021-05-14 MED ORDER — ALUM & MAG HYDROXIDE-SIMETH 200-200-20 MG/5ML PO SUSP
30.0000 mL | Freq: Once | ORAL | Status: AC
  Administered 2021-05-14: 30 mL via ORAL
  Filled 2021-05-14: qty 30

## 2021-05-14 MED ORDER — DICYCLOMINE HCL 10 MG PO CAPS
10.0000 mg | ORAL_CAPSULE | Freq: Once | ORAL | Status: AC
  Administered 2021-05-14: 10 mg via ORAL
  Filled 2021-05-14: qty 1

## 2021-05-14 NOTE — ED Provider Notes (Signed)
[facility] EMERGENCY DEPARTMENT Provider Note   CSN: [id number] Arrival date & time: 05/13/21  2136     History Chief Complaint  Patient presents with   Palpitations    Yolanda Jennings is a 50 y.o. female.  The history is provided by the patient.  Palpitations Palpitations quality:  Fast Onset quality:  Sudden Duration:  2 days Timing: initially intermittent now constant as we speak. she was having them at the time the EKG was taken. Progression:  Worsening Chronicity:  Recurrent Context: not hyperventilation   Relieved by:  Nothing Worsened by:  Nothing Ineffective treatments:  None tried Associated symptoms: no back pain, no chest pain, no chest pressure, no cough, no diaphoresis, no dizziness, no hemoptysis, no leg pain, no lower extremity edema, no numbness, no shortness of breath, no syncope, no vomiting and no weakness   Associated symptoms comment:  No travel, no surgeries, no exogenous estrogen  Risk factors: no diabetes mellitus, no heart disease, no hx of atrial fibrillation, no hx of DVT, no hx of PE, no hx of thyroid disease, no hypercoagulable state and no hyperthyroidism   Patient is concerned her potassium is low because it was the last time she had these symptoms.      Past Medical History:  Diagnosis Date   Allergy    Hypertension    Sickle cell trait ([place])    Tobacco abuse    Stopped smoking 08/24/13    Patient Active Problem List   Diagnosis Date Noted   Cervical myofascial pain syndrome 07/20/2020   Obesity (BMI 30.0-34.9) 08/22/2014   Microcytic anemia 09/03/2013   Headache 09/03/2013   HTN (hypertension) 08/28/2013    Past Surgical History:  Procedure Laterality Date   BREAST BIOPSY Left 2011   HERNIA REPAIR     TUBAL LIGATION     2003     OB History     Gravida  2   Para  2   Term  2   Preterm      AB      Living  2      SAB      IAB      Ectopic      Multiple      Live Births  2            Family History  Problem Relation Age of Onset   Cancer Mother        lung & cervical   Hypertension Mother    Alcohol abuse Father    Hypertension Brother    Colon cancer Neg Hx    Esophageal cancer Neg Hx    Rectal cancer Neg Hx    Stomach cancer Neg Hx     Social History   Tobacco Use   Smoking status: Former    Years: 20.00    Types: Cigarettes    Quit date: 08/24/2013    Years since quitting: 7.7   Smokeless tobacco: Never  Vaping Use   Vaping Use: Never used  Substance Use Topics   Alcohol use: Yes    Alcohol/week: 0.0 standard drinks    Comment: occasional   Drug use: No    Home Medications Prior to Admission medications   Medication Sig Start Date End Date Taking? Authorizing Provider  amLODipine (NORVASC) 10 MG tablet Take 1 tablet (10 mg total) by mouth daily. 12/07/20   [name], MD  carvedilol (COREG) 25 MG tablet Take 1 tablet (25 mg total)  by mouth 2 (two) times daily with a meal. 12/07/20   [name], MD  cloNIDine (CATAPRES) 0.1 MG tablet Take 1 tablet (0.1 mg total) by mouth 2 (two) times daily. 12/07/20   [name], MD  cyclobenzaprine (FLEXERIL) 10 MG tablet Take 1 tablet (10 mg total) by mouth at bedtime. 07/20/20   Melvenia Beam, MD  ipratropium (ATROVENT) 0.06 % nasal spray Place 1-2 sprays into both nostrils 4 (four) times daily. As needed for nasal congestion 02/05/20   [name], MD  methocarbamol (ROBAXIN) 500 MG tablet Take 1 tablet (500 mg total) by mouth every 8 (eight) hours as needed for muscle spasms (start at bedtime initially). 06/08/20   [name], MD  potassium chloride SA (KLOR-CON) 20 MEQ tablet Take 2 tablets (40 mEq total) by mouth daily. 12/07/20   [name], MD    Allergies    Ace inhibitors and Lisinopril  Review of Systems   Review of Systems  Constitutional:  Negative for diaphoresis.  HENT:  Negative for facial swelling.   Eyes:  Negative for redness.  Respiratory:   Negative for cough, hemoptysis and shortness of breath.   Cardiovascular:  Positive for palpitations. Negative for chest pain, leg swelling and syncope.  Gastrointestinal:  Negative for vomiting.  Genitourinary:  Negative for difficulty urinating.  Musculoskeletal:  Negative for back pain.  Skin:  Negative for rash.  Neurological:  Negative for dizziness, weakness and numbness.  Psychiatric/Behavioral:  Negative for agitation.   All other systems reviewed and are negative.  Physical Exam Updated Vital Signs BP 136/84 (BP Location: Right Arm)   Pulse 67   Temp 98.2 F (36.8 C) (Oral)   Resp 17   Ht 5\' 4"  (1.626 m)   Wt 80.7 kg   SpO2 100%   BMI 30.55 kg/m   Physical Exam Vitals and nursing note reviewed.  Constitutional:      Appearance: Normal appearance. She is not ill-appearing or diaphoretic.  HENT:     Head: Normocephalic and atraumatic.     Nose: Nose normal.  Eyes:     Conjunctiva/sclera: Conjunctivae normal.     Pupils: Pupils are equal, round, and reactive to light.  Cardiovascular:     Rate and Rhythm: Normal rate and regular rhythm.     Pulses: Normal pulses.     Heart sounds: Normal heart sounds.  Pulmonary:     Effort: Pulmonary effort is normal.     Breath sounds: Normal breath sounds.  Abdominal:     General: Abdomen is flat. Bowel sounds are normal.     Palpations: Abdomen is soft.     Tenderness: There is no abdominal tenderness. There is no guarding.  Musculoskeletal:        General: Normal range of motion.     Cervical back: Normal range of motion and neck supple.  Skin:    General: Skin is warm and dry.     Capillary Refill: Capillary refill takes less than 2 seconds.  Neurological:     General: No focal deficit present.     Mental Status: She is alert and oriented to person, place, and time.     Deep Tendon Reflexes: Reflexes normal.  Psychiatric:        Mood and Affect: Mood normal.        Behavior: Behavior normal.    ED Results /  Procedures / Treatments   Labs (all labs ordered are listed, but only abnormal results  are displayed) Labs Reviewed  CBC WITH DIFFERENTIAL/PLATELET - Abnormal; Notable for the following components:      Result Value   Hemoglobin 11.8 (*)    HCT 34.7 (*)    MCV 79.8 (*)    All other components within normal limits  BASIC METABOLIC PANEL - Abnormal; Notable for the following components:   Glucose, Bld 151 (*)    All other components within normal limits  TROPONIN I (HIGH SENSITIVITY)    EKG EKG Interpretation  Date/Time:  Saturday May 13 2021 21:50:35 EST Ventricular Rate:  83 PR Interval:  182 QRS Duration: 72 QT Interval:  344 QTC Calculation: 404 R Axis:   27 Text Interpretation: Normal sinus rhythm Confirmed by Dory Horn) on 05/14/2021 2:11:53 AM  Radiology DG Chest 2 View  Result Date: 05/13/2021 CLINICAL DATA:  Palpitations.  Shortness of breath. EXAM: CHEST - 2 VIEW COMPARISON:  Radiograph and chest CT December 2018 FINDINGS: Lower lung volumes from prior exam.The cardiomediastinal contours are unchanged allowing for differences in technique. Mild peribronchial thickening. Pulmonary vasculature is normal. No consolidation, pleural effusion, or pneumothorax. No acute osseous abnormalities are seen. IMPRESSION: Mild peribronchial thickening. Electronically Signed   By: Keith Rake M.D.   On: 05/13/2021 22:42    Procedures Procedures   Medications Ordered in ED Medications - No data to display  ED Course  I have reviewed the triage vital signs and the nursing notes.  Pertinent labs & imaging results that were available during my care of the patient were reviewed by me and considered in my medical decision making (see chart for details).   Patient is having ongoing symptoms at the time of the EKG which was normal without PACs or PVCs.  She is feeling them while I am examining her and her HR is in the 60s and regular, no skipped beats.  Perhaps this is  a muscle spasm in the overlying chest wall.  I do not think this episode is cardiac in nature.  Potassium is normal.  PERC negative wells 0 highly doubt PE in this low risk patient.  Given time course has ruled out for MI in the ED.  Final Clinical Impression(s) / ED Diagnoses Final diagnoses:  None   Return for intractable cough, coughing up blood, fevers > 100.4 unrelieved by medication, shortness of breath, intractable vomiting, chest pain, shortness of breath, weakness, numbness, changes in speech, facial asymmetry, abdominal pain, passing out, Inability to tolerate liquids or food, cough, altered mental status or any concerns. No signs of systemic illness or infection. The patient is nontoxic-appearing on exam and vital signs are within normal limits.  I have reviewed the triage vital signs and the nursing notes. Pertinent labs & imaging results that were available during my care of the patient were reviewed by me and considered in my medical decision making (see chart for details). After history, exam, and medical workup I feel the patient has been appropriately medically screened and is safe for discharge home. Pertinent diagnoses were discussed with the patient. Patient was given return precautions.  Rx / DC Orders ED Discharge Orders     None        , , MD 05/14/21 [phone]

## 2021-05-16 ENCOUNTER — Encounter: Payer: Self-pay | Admitting: Family Medicine

## 2021-05-24 ENCOUNTER — Other Ambulatory Visit: Payer: Self-pay

## 2021-06-08 ENCOUNTER — Encounter: Payer: 59 | Admitting: Family Medicine

## 2021-07-10 ENCOUNTER — Other Ambulatory Visit: Payer: Self-pay | Admitting: Family Medicine

## 2021-07-10 DIAGNOSIS — I10 Essential (primary) hypertension: Secondary | ICD-10-CM

## 2021-07-26 ENCOUNTER — Other Ambulatory Visit: Payer: Self-pay | Admitting: Family Medicine

## 2021-07-26 DIAGNOSIS — I10 Essential (primary) hypertension: Secondary | ICD-10-CM

## 2021-08-04 ENCOUNTER — Ambulatory Visit (HOSPITAL_BASED_OUTPATIENT_CLINIC_OR_DEPARTMENT_OTHER): Payer: Self-pay | Admitting: Obstetrics & Gynecology

## 2021-08-09 ENCOUNTER — Ambulatory Visit (HOSPITAL_BASED_OUTPATIENT_CLINIC_OR_DEPARTMENT_OTHER): Payer: 59 | Admitting: Obstetrics & Gynecology

## 2021-08-14 ENCOUNTER — Ambulatory Visit (HOSPITAL_BASED_OUTPATIENT_CLINIC_OR_DEPARTMENT_OTHER): Payer: Self-pay | Admitting: Obstetrics & Gynecology

## 2021-08-14 ENCOUNTER — Encounter (HOSPITAL_BASED_OUTPATIENT_CLINIC_OR_DEPARTMENT_OTHER): Payer: Self-pay

## 2021-08-16 ENCOUNTER — Ambulatory Visit (HOSPITAL_BASED_OUTPATIENT_CLINIC_OR_DEPARTMENT_OTHER): Payer: Self-pay | Admitting: Obstetrics & Gynecology

## 2021-08-17 ENCOUNTER — Other Ambulatory Visit (HOSPITAL_COMMUNITY)
Admission: RE | Admit: 2021-08-17 | Discharge: 2021-08-17 | Disposition: A | Discharge status: Home / Self Care | Payer: 59 | Source: Ambulatory Visit | Attending: Obstetrics & Gynecology | Admitting: Obstetrics & Gynecology

## 2021-08-17 ENCOUNTER — Other Ambulatory Visit: Payer: Self-pay

## 2021-08-17 ENCOUNTER — Encounter (HOSPITAL_BASED_OUTPATIENT_CLINIC_OR_DEPARTMENT_OTHER): Payer: Self-pay | Admitting: Obstetrics & Gynecology

## 2021-08-17 ENCOUNTER — Ambulatory Visit (INDEPENDENT_AMBULATORY_CARE_PROVIDER_SITE_OTHER): Payer: 59 | Admitting: Obstetrics & Gynecology

## 2021-08-17 VITALS — BP 131/86 | HR 70 | Ht 65.0 in | Wt 177.0 lb

## 2021-08-17 DIAGNOSIS — Z124 Encounter for screening for malignant neoplasm of cervix: Secondary | ICD-10-CM

## 2021-08-17 DIAGNOSIS — N852 Hypertrophy of uterus: Secondary | ICD-10-CM | POA: Diagnosis not present

## 2021-08-17 DIAGNOSIS — N926 Irregular menstruation, unspecified: Secondary | ICD-10-CM

## 2021-08-17 MED ORDER — NORETHINDRONE 0.35 MG PO TABS: 1.0000 | ORAL_TABLET | Freq: Every day | ORAL | 3 refills | Status: DC

## 2021-08-17 NOTE — Progress Notes (Signed)
GYNECOLOGY  VISIT  CC:   irregular bleeding  HPI: 51 y.o. G50P2002 Single Black or African American female here for complaint of irregular bleeding that started in the fall of 2022.  Prior to that cycles were regular and lasted 5 days.  Typically the first day was heavy.  For the last several months, she's has bleeding episodes that lasted up to two weeks.  There were two or three days of heavier flow with clotting.  This was very different.  The last bleeding episode started 12/18 and lasted for 30 days.  She's never had anything like this in the past.   Patient Active Problem List   Diagnosis Date Noted   Cervical myofascial pain syndrome 07/20/2020   Obesity (BMI 30.0-34.9) 08/22/2014   Microcytic anemia 09/03/2013   Headache 09/03/2013   HTN (hypertension) 08/28/2013    Past Medical History:  Diagnosis Date   Allergy    Hypertension    Sickle cell trait ([place])    Tobacco abuse    Stopped smoking 08/24/13    Past Surgical History:  Procedure Laterality Date   BREAST BIOPSY Left 2011   HERNIA REPAIR     TUBAL LIGATION     2003    MEDS:   Current Outpatient Medications on File Prior to Visit  Medication Sig Dispense Refill   amLODipine (NORVASC) 10 MG tablet Take 1 tablet (10 mg total) by mouth daily. 90 tablet 1   carvedilol (COREG) 25 MG tablet TAKE 1 TABLET BY MOUTH TWICE DAILY WITH A MEAL 180 tablet 0   cloNIDine (CATAPRES) 0.1 MG tablet Take 1 tablet by mouth twice daily 180 tablet 0   cyclobenzaprine (FLEXERIL) 10 MG tablet Take 1 tablet (10 mg total) by mouth at bedtime. 90 tablet 3   methocarbamol (ROBAXIN) 500 MG tablet Take 1 tablet (500 mg total) by mouth every 8 (eight) hours as needed for muscle spasms (start at bedtime initially). 30 tablet 0   potassium chloride SA (KLOR-CON) 20 MEQ tablet Take 2 tablets (40 mEq total) by mouth daily. 60 tablet 5   No current facility-administered medications on file prior to visit.    ALLERGIES: Ace inhibitors and  Lisinopril  Family History  Problem Relation Age of Onset   Alcohol abuse Father    Cancer Mother        lung & cervical   Hypertension Mother    Hypertension Brother    Hypertension Sister    Colon cancer Neg Hx    Esophageal cancer Neg Hx    Rectal cancer Neg Hx    Stomach cancer Neg Hx     SH:  single, former smoker (quit about 7 years ago)  Review of Systems  Constitutional: Negative.   Respiratory: Negative.    Cardiovascular: Negative.   Genitourinary:        Irregular bleeding  Psychiatric/Behavioral: Negative.     PHYSICAL EXAMINATION:    BP 131/86    Pulse 70    Ht 5\' 5"  (1.651 m)    Wt 177 lb (80.3 kg)    LMP 06/25/2021    BMI 29.45 kg/m     General appearance: alert, cooperative and appears stated age Abdomen: soft, non-tender; bowel sounds normal; no masses,  no organomegaly Lymph:  no inguinal LAD noted  Pelvic: External genitalia:  no lesions              Urethra:  normal appearing urethra with no masses, tenderness or lesions  Bartholins and Skenes: normal                 Vagina: normal appearing vagina with normal color and discharge, no lesions              Cervix: no lesions              Bimanual Exam:  Uterus:  enlarged, 8-10 weeks size              Adnexa: no mass, fullness, tenderness  Chaperone, Ezekiel Ina, RN, was present for exam.  Assessment/Plan: 1. Enlarged uterus - will have pt return for ultrasound to ensure this is being caused by fibroid.  Exam most consistent with this. - US PELVIS TRANSVAGINAL NON-OB (TV ONLY); Future  2. Irregular bleeding - treatment options discussed.  Due to hypertension, feel should start with oral progesterone therapy. - norethindrone (MICRONOR) 0.35 MG tablet; Take 1 tablet (0.35 mg total) by mouth daily.  Dispense: 28 tablet; Refill: 3  3. Cervical cancer screening - Cytology - PAP( [place])

## 2021-08-18 LAB — CYTOLOGY - PAP
Comment: NEGATIVE
Diagnosis: NEGATIVE
High risk HPV: NEGATIVE

## 2021-08-30 ENCOUNTER — Ambulatory Visit (INDEPENDENT_AMBULATORY_CARE_PROVIDER_SITE_OTHER): Payer: 59

## 2021-08-30 ENCOUNTER — Other Ambulatory Visit: Payer: Self-pay

## 2021-08-30 ENCOUNTER — Encounter (HOSPITAL_BASED_OUTPATIENT_CLINIC_OR_DEPARTMENT_OTHER): Payer: Self-pay | Admitting: Obstetrics & Gynecology

## 2021-08-30 ENCOUNTER — Ambulatory Visit (INDEPENDENT_AMBULATORY_CARE_PROVIDER_SITE_OTHER): Payer: 59 | Admitting: Obstetrics & Gynecology

## 2021-08-30 VITALS — BP 130/78 | HR 79 | Ht 65.0 in | Wt 178.4 lb

## 2021-08-30 DIAGNOSIS — D251 Intramural leiomyoma of uterus: Secondary | ICD-10-CM

## 2021-08-30 DIAGNOSIS — N852 Hypertrophy of uterus: Secondary | ICD-10-CM

## 2021-08-30 DIAGNOSIS — D500 Iron deficiency anemia secondary to blood loss (chronic): Secondary | ICD-10-CM

## 2021-08-30 DIAGNOSIS — N921 Excessive and frequent menstruation with irregular cycle: Secondary | ICD-10-CM | POA: Diagnosis not present

## 2021-08-30 NOTE — Patient Instructions (Signed)
Uterine Artery Embolization Sonata radiofrequency ablation of fibroid Myomectomy Hysterectomy

## 2021-09-02 DIAGNOSIS — N921 Excessive and frequent menstruation with irregular cycle: Secondary | ICD-10-CM | POA: Insufficient documentation

## 2021-09-02 DIAGNOSIS — D500 Iron deficiency anemia secondary to blood loss (chronic): Secondary | ICD-10-CM | POA: Insufficient documentation

## 2021-09-02 DIAGNOSIS — D251 Intramural leiomyoma of uterus: Secondary | ICD-10-CM | POA: Insufficient documentation

## 2021-09-02 NOTE — Progress Notes (Signed)
GYNECOLOGY  VISIT  CC:  menorrhagia with irregular bleeding, discuss ultrasound   HPI: 51 y.o. G1P2002 Single Black or African American female here for discussion of ultrasound and possible treatment options.  Ultrasound performed today showed uterus measuring: 10.5cm x 8.4cm x 7.2cm.  Volume: 335.18ml.  Anterior intramural fibroid noted measuring 5.6cm x 4.2cm.  This appears separate from the endometrium.  Discussed nature of fibroids with pt including symptoms, improvement with menopause and treatment options.  She was started on micronor and had a cycle since starting.  This was improved and only 7 days.  Has not had additional bleeding again this month.  As she's not used this for very long, not sure if it's actually really helping or not.  Treatment for fibroids discussed including progesterone options, GNRH agonists and antagonists, myomectomy, [place], RFA with Sonata and hysterectomy.  Procedures, improvement in bleeding that is typical, risks of procedures discussed.  She is interested in the RFA treatment of fibroids.  Will proceed with trying to precert.  Pt understands process.  Paperwork signed today.  .  Patient Active Problem List   Diagnosis Date Noted   Cervical myofascial pain syndrome 07/20/2020   Obesity (BMI 30.0-34.9) 08/22/2014   Microcytic anemia 09/03/2013   Headache 09/03/2013   HTN (hypertension) 08/28/2013    Past Medical History:  Diagnosis Date   Allergy    Hypertension    Sickle cell trait ([place])    Tobacco abuse    Stopped smoking 08/24/13    Past Surgical History:  Procedure Laterality Date   BREAST BIOPSY Left 2011   HERNIA REPAIR  2013   TUBAL LIGATION     2003    MEDS:   Current Outpatient Medications on File Prior to Visit  Medication Sig Dispense Refill   amLODipine (NORVASC) 10 MG tablet Take 1 tablet (10 mg total) by mouth daily. 90 tablet 1   carvedilol (COREG) 25 MG tablet TAKE 1 TABLET BY MOUTH TWICE DAILY WITH A MEAL 180 tablet 0    cloNIDine (CATAPRES) 0.1 MG tablet Take 1 tablet by mouth twice daily 180 tablet 0   cyclobenzaprine (FLEXERIL) 10 MG tablet Take 1 tablet (10 mg total) by mouth at bedtime. 90 tablet 3   methocarbamol (ROBAXIN) 500 MG tablet Take 1 tablet (500 mg total) by mouth every 8 (eight) hours as needed for muscle spasms (start at bedtime initially). 30 tablet 0   norethindrone (MICRONOR) 0.35 MG tablet Take 1 tablet (0.35 mg total) by mouth daily. 28 tablet 3   potassium chloride SA (KLOR-CON) 20 MEQ tablet Take 2 tablets (40 mEq total) by mouth daily. 60 tablet 5   No current facility-administered medications on file prior to visit.    ALLERGIES: Ace inhibitors and Lisinopril  Family History  Problem Relation Age of Onset   Alcohol abuse Father    Cancer Mother        lung & cervical   Hypertension Mother    Hypertension Brother    Hypertension Sister    Colon cancer Neg Hx    Esophageal cancer Neg Hx    Rectal cancer Neg Hx    Stomach cancer Neg Hx     SH:  single, former smoker  Review of Systems  Constitutional: Negative.   Respiratory: Negative.    Cardiovascular: Negative.   Genitourinary:        Menstrual bleeding issues   PHYSICAL EXAMINATION:    BP 130/78 (BP Location: Right Arm, Patient Position: Sitting, Cuff Size: Large)  Pulse 79    Ht 5\' 5"  (1.651 m) Comment: reported   Wt 178 lb 6.4 oz (80.9 kg)    BMI 29.69 kg/m     Physical Exam Constitutional:      Appearance: Normal appearance.  Neurological:     General: No focal deficit present.     Mental Status: She is alert.  Psychiatric:        Mood and Affect: Mood normal.    Assessment/Plan: 1. Intramural uterine fibroid - will proceed with working on prior authorization for RFA with Sonata procedure  2. Menorrhagia with irregular cycle - continue micronor for now.  Cannot tell at this point if this has helped bleeding or not.  3.  Iron deficiency anemia - not currently on iron but has been in the  past.  Total time with pt and documentation:  38 minutes

## 2021-09-09 ENCOUNTER — Other Ambulatory Visit: Payer: Self-pay | Admitting: Family Medicine

## 2021-09-09 DIAGNOSIS — I10 Essential (primary) hypertension: Secondary | ICD-10-CM

## 2021-10-07 ENCOUNTER — Other Ambulatory Visit: Payer: Self-pay | Admitting: Family Medicine

## 2021-10-07 DIAGNOSIS — I10 Essential (primary) hypertension: Secondary | ICD-10-CM

## 2021-10-25 ENCOUNTER — Other Ambulatory Visit: Payer: Self-pay | Admitting: Family Medicine

## 2021-10-25 DIAGNOSIS — I10 Essential (primary) hypertension: Secondary | ICD-10-CM

## 2021-10-25 NOTE — Telephone Encounter (Signed)
Patient is requesting a refill of the following medications: ?Requested Prescriptions  ? ?Pending Prescriptions Disp Refills  ? cloNIDine (CATAPRES) 0.1 MG tablet [Pharmacy Med Name: cloNIDine HCl 0.1 MG Oral Tablet] 180 tablet 0  ?  Sig: TAKE 1 TABLET BY MOUTH TWICE DAILY. APPOINTMENT REQUIRED FOR FUTURE REFILLS  ? ? ?Date of patient request: 10/25/21 ?Last office visit: 08/30/21 ?Date of last refill: 09/11/21 ?Last refill amount: 180 ? ?

## 2021-11-13 NOTE — Telephone Encounter (Signed)
NA

## 2021-12-12 ENCOUNTER — Other Ambulatory Visit: Payer: Self-pay | Admitting: Family Medicine

## 2021-12-12 DIAGNOSIS — I10 Essential (primary) hypertension: Secondary | ICD-10-CM

## 2022-01-14 ENCOUNTER — Other Ambulatory Visit: Payer: Self-pay | Admitting: Family Medicine

## 2022-01-14 DIAGNOSIS — I10 Essential (primary) hypertension: Secondary | ICD-10-CM

## 2022-01-23 ENCOUNTER — Telehealth: Payer: Self-pay | Admitting: Family Medicine

## 2022-01-23 NOTE — Telephone Encounter (Signed)
Pt has a appt on 02/07/22. Pt called states that she is out of clonidine 0.1 mg. Pt wants to know if she can get a partial refill until her appt on 02/07/22. Please send refill to [facility] on [address].

## 2022-01-23 NOTE — Telephone Encounter (Signed)
Refill was sent in on 01/15/22 to [place]

## 2022-02-07 ENCOUNTER — Encounter: Payer: Self-pay | Admitting: Family Medicine

## 2022-02-07 ENCOUNTER — Ambulatory Visit (INDEPENDENT_AMBULATORY_CARE_PROVIDER_SITE_OTHER): Payer: 59 | Admitting: Family Medicine

## 2022-02-07 ENCOUNTER — Other Ambulatory Visit: Payer: 59

## 2022-02-07 VITALS — BP 144/72 | HR 62 | Temp 98.2°F | Resp 20 | Ht 65.0 in | Wt 179.4 lb

## 2022-02-07 DIAGNOSIS — R739 Hyperglycemia, unspecified: Secondary | ICD-10-CM

## 2022-02-07 DIAGNOSIS — I10 Essential (primary) hypertension: Secondary | ICD-10-CM | POA: Diagnosis not present

## 2022-02-07 LAB — BASIC METABOLIC PANEL
BUN: 10 mg/dL (ref 6–23)
CO2: 24 mEq/L (ref 19–32)
Calcium: 9.3 mg/dL (ref 8.4–10.5)
Chloride: 104 mEq/L (ref 96–112)
Creatinine, Ser: 1.04 mg/dL (ref 0.40–1.20)
GFR: 61.99 mL/min (ref >60.00)
Glucose, Bld: 161 mg/dL — ABNORMAL HIGH (ref 70–99)
Potassium: 3.7 mEq/L (ref 3.5–5.1)
Sodium: 136 mEq/L (ref 135–145)

## 2022-02-07 LAB — HEMOGLOBIN A1C: Hgb A1c MFr Bld: 5.9 % (ref 4.6–6.5)

## 2022-02-07 NOTE — Patient Instructions (Addendum)
No med changes for now, but blood pressure running higher in the office today.  Keep a record of your blood pressures outside of the office and if over 140/90 - let me know so we can adjust meds. I will let you know if lab concerns.   Managing Your Hypertension Hypertension, also called high blood pressure, is when the force of the blood pressing against the walls of the arteries is too strong. Arteries are blood vessels that carry blood from your heart throughout your body. Hypertension forces the heart to work harder to pump blood and may cause the arteries to become narrow or stiff. Understanding blood pressure readings A blood pressure reading includes a higher number over a lower number: The first, or top, number is called the systolic pressure. It is a measure of the pressure in your arteries as your heart beats. The second, or bottom number, is called the diastolic pressure. It is a measure of the pressure in your arteries as the heart relaxes. For most people, a normal blood pressure is below 120/80. Your personal target blood pressure may vary depending on your medical conditions, your age, and other factors. Blood pressure is classified into four stages. Based on your blood pressure reading, your health care provider may use the following stages to determine what type of treatment you need, if any. Systolic pressure and diastolic pressure are measured in a unit called millimeters of mercury (mmHg). Normal Systolic pressure: below 503. Diastolic pressure: below 80. Elevated Systolic pressure: 546-568. Diastolic pressure: below 80. Hypertension stage 1 Systolic pressure: 127-517. Diastolic pressure: 00-17. Hypertension stage 2 Systolic pressure: 494 or above. Diastolic pressure: 90 or above. How can this condition affect me? Managing your hypertension is very important. Over time, hypertension can damage the arteries and decrease blood flow to parts of the body, including the brain,  heart, and kidneys. Having untreated or uncontrolled hypertension can lead to: A heart attack. A stroke. A weakened blood vessel (aneurysm). Heart failure. Kidney damage. Eye damage. Memory and concentration problems. Vascular dementia. What actions can I take to manage this condition? Hypertension can be managed by making lifestyle changes and possibly by taking medicines. Your health care provider will help you make a plan to bring your blood pressure within a normal range. You may be referred for counseling on a healthy diet and physical activity. Nutrition  Eat a diet that is high in fiber and potassium, and low in salt (sodium), added sugar, and fat. An example eating plan is called the DASH diet. DASH stands for Dietary Approaches to Stop Hypertension. To eat this way: Eat plenty of fresh fruits and vegetables. Try to fill one-half of your plate at each meal with fruits and vegetables. Eat whole grains, such as whole-wheat pasta, brown rice, or whole-grain bread. Fill about one-fourth of your plate with whole grains. Eat low-fat dairy products. Avoid fatty cuts of meat, processed or cured meats, and poultry with skin. Fill about one-fourth of your plate with lean proteins such as fish, chicken without skin, beans, eggs, and tofu. Avoid pre-made and processed foods. These tend to be higher in sodium, added sugar, and fat. Reduce your daily sodium intake. Many people with hypertension should eat less than 1,500 mg of sodium a day. Lifestyle  Work with your health care provider to maintain a healthy body weight or to lose weight. Ask what an ideal weight is for you. Get at least 30 minutes of exercise that causes your heart to beat faster (aerobic  exercise) most days of the week. Activities may include walking, swimming, or biking. Include exercise to strengthen your muscles (resistance exercise), such as weight lifting, as part of your weekly exercise routine. Try to do these types of  exercises for 30 minutes at least 3 days a week. Do not use any products that contain nicotine or tobacco. These products include cigarettes, chewing tobacco, and vaping devices, such as e-cigarettes. If you need help quitting, ask your health care provider. Control any long-term (chronic) conditions you have, such as high cholesterol or diabetes. Identify your sources of stress and find ways to manage stress. This may include meditation, deep breathing, or making time for fun activities. Alcohol use Do not drink alcohol if: Your health care provider tells you not to drink. You are pregnant, may be pregnant, or are planning to become pregnant. If you drink alcohol: Limit how much you have to: 0-1 drink a day for women. 0-2 drinks a day for men. Know how much alcohol is in your drink. In the [place] one drink equals one 12 oz bottle of beer (355 mL), one 5 oz glass of wine (148 mL), or one 1 oz glass of hard liquor (44 mL). Medicines Your health care provider may prescribe medicine if lifestyle changes are not enough to get your blood pressure under control and if: Your systolic blood pressure is 130 or higher. Your diastolic blood pressure is 80 or higher. Take medicines only as told by your health care provider. Follow the directions carefully. Blood pressure medicines must be taken as told by your health care provider. The medicine does not work as well when you skip doses. Skipping doses also puts you at risk for problems. Monitoring Before you monitor your blood pressure: Do not smoke, drink caffeinated beverages, or exercise within 30 minutes before taking a measurement. Use the bathroom and empty your bladder (urinate). Sit quietly for at least 5 minutes before taking measurements. Monitor your blood pressure at home as told by your health care provider. To do this: Sit with your back straight and supported. Place your feet flat on the floor. Do not cross your legs. Support your arm on  a flat surface, such as a table. Make sure your upper arm is at heart level. Each time you measure, take two or three readings one minute apart and record the results. You may also need to have your blood pressure checked regularly by your health care provider. General information Talk with your health care provider about your diet, exercise habits, and other lifestyle factors that may be contributing to hypertension. Review all the medicines you take with your health care provider because there may be side effects or interactions. Keep all follow-up visits. Your health care provider can help you create and adjust your plan for managing your high blood pressure. Where to find more information National Heart, Lung, and Blood Institute: https://wilson-eaton.com/ American Heart Association: www.heart.org Contact a health care provider if: You think you are having a reaction to medicines you have taken. You have repeated (recurrent) headaches. You feel dizzy. You have swelling in your ankles. You have trouble with your vision. Get help right away if: You develop a severe headache or confusion. You have unusual weakness or numbness, or you feel faint. You have severe pain in your chest or abdomen. You vomit repeatedly. You have trouble breathing. These symptoms may be an emergency. Get help right away. Call 911. Do not wait to see if the symptoms will go away.  Do not drive yourself to the hospital. Summary Hypertension is when the force of blood pumping through your arteries is too strong. If this condition is not controlled, it may put you at risk for serious complications. Your personal target blood pressure may vary depending on your medical conditions, your age, and other factors. For most people, a normal blood pressure is less than 120/80. Hypertension is managed by lifestyle changes, medicines, or both. Lifestyle changes to help manage hypertension include losing weight, eating a healthy,  low-sodium diet, exercising more, stopping smoking, and limiting alcohol. This information is not intended to replace advice given to you by your health care provider. Make sure you discuss any questions you have with your health care provider. Document Revised: 03/09/2021 Document Reviewed: 03/09/2021 Elsevier Patient Education  [place].

## 2022-02-07 NOTE — Progress Notes (Unsigned)
Subjective:  Patient ID: Yolanda Jennings, female    DOB: 11-11-1970  Age: 51 y.o. MRN: [id number]  CC:  Chief Complaint  Patient presents with   Hypertension    F/u for med refills    HPI Yolanda Jennings presents for   Hypertension: Norvasc '10mg'$  qd, carvedilol '25mg'$  BID, clonidine 0.'1mg'$  bid, potassium 55mq qd.  Home readings: 128/81, stable at home.  No side effects with meds.  BP Readings from Last 3 Encounters:  02/07/22 (!) 140/72  08/30/21 130/78  08/17/21 131/86   Lab Results  Component Value Date   CREATININE 0.95 05/13/2021   Hyperglycemia: 151 on ER labs 05/13/21. A1c at prediabetic level in 2021.  Lab Results  Component Value Date   HGBA1C 5.8 (H) 02/05/2020  No blurry vision, increased thirst or urinary frequency.    History Patient Active Problem List   Diagnosis Date Noted   Menorrhagia with irregular cycle 09/02/2021   Intramural uterine fibroid 09/02/2021   Iron deficiency anemia due to chronic blood loss 09/02/2021   Cervical myofascial pain syndrome 07/20/2020   Obesity (BMI 30.0-34.9) 08/22/2014   Microcytic anemia 09/03/2013   Headache 09/03/2013   HTN (hypertension) 08/28/2013   Past Medical History:  Diagnosis Date   Allergy    Hypertension    [place]    Tobacco abuse    Stopped smoking 08/24/13   Past Surgical History:  Procedure Laterality Date   BREAST BIOPSY Left 2011   HERNIA REPAIR  2013   TUBAL LIGATION     2003   Allergies  Allergen Reactions   Ace Inhibitors Swelling and Other (See Comments)    Reaction:  Tongue swelling    Lisinopril Swelling and Other (See Comments)    Reaction:  Tongue swelling    Prior to Admission medications   Medication Sig Start Date End Date Taking? Authorizing Provider  amLODipine (NORVASC) 10 MG tablet Take 1 tablet by mouth once daily 12/12/21  Yes GWendie Agreste MD  carvedilol (COREG) 25 MG tablet TAKE 1 TABLET BY MOUTH TWICE DAILY WITH A MEAL 10/10/21  Yes GWendie Agreste MD  cloNIDine (CATAPRES) 0.1 MG tablet TAKE 1 TABLET BY MOUTH TWICE DAILY. APPOINTMENT REQUIRED FOR FUTURE REFILLS 01/15/22  Yes GWendie Agreste MD  cyclobenzaprine (FLEXERIL) 10 MG tablet Take 1 tablet (10 mg total) by mouth at bedtime. 07/20/20  Yes AMelvenia Beam MD  methocarbamol (ROBAXIN) 500 MG tablet Take 1 tablet (500 mg total) by mouth every 8 (eight) hours as needed for muscle spasms (start at bedtime initially). 06/08/20  Yes GWendie Agreste MD  potassium chloride SA (KLOR-CON) 20 MEQ tablet Take 2 tablets (40 mEq total) by mouth daily. 12/07/20  Yes GWendie Agreste MD  norethindrone (MICRONOR) 0.35 MG tablet Take 1 tablet (0.35 mg total) by mouth daily. Patient not taking: Reported on 02/07/2022 08/17/21   MMegan Salon MD   Social History   Socioeconomic History   Marital status: Single    Spouse name: Not on file   Number of children: Not on file   Years of education: Not on file   Highest education level: Not on file  Occupational History   Occupation: FFreight forwarder Tobacco Use   Smoking status: Former    Years: 20.00    Types: Cigarettes    Quit date: 08/24/2013    Years since quitting: 8.4   Smokeless tobacco: Never  Vaping Use   Vaping Use: Never  used  Substance and Sexual Activity   Alcohol use: Yes    Alcohol/week: 0.0 standard drinks of alcohol    Comment: occasional   Drug use: No   Sexual activity: Yes    Birth control/protection: Condom  Other Topics Concern   Not on file  Social History Narrative   Marital status: Single      Children: 2 children; no grandchildren.      Lives: with one child      Employment: Freight forwarder      Tobacco: quit 08/2013.       Alcohol:  Socially.      Exercise: Yes      Education: [employer].      Right handed   Caffeine: coffee in the winter, 1-2 cups/day   Social Determinants of Health   Financial Resource Strain: Not on file  Food Insecurity: Not on file  Transportation Needs: Not on  file  Physical Activity: Not on file  Stress: Not on file  Social Connections: Not on file  Intimate Partner Violence: Not on file    Review of Systems  Constitutional:  Negative for fatigue and unexpected weight change.  Respiratory:  Negative for chest tightness and shortness of breath.   Cardiovascular:  Negative for chest pain, palpitations and leg swelling.  Gastrointestinal:  Negative for abdominal pain and blood in stool.  Neurological:  Negative for dizziness, syncope, light-headedness and headaches.     Objective:   Vitals:   02/07/22 1344  BP: (!) 140/72  Pulse: 62  Resp: 20  Temp: 98.2 F (36.8 C)  SpO2: 99%  Weight: 179 lb 6.4 oz (81.4 kg)  Height: '5\' 5"'$  (1.651 m)     Physical Exam Vitals reviewed.  Constitutional:      Appearance: Normal appearance. She is well-developed.  HENT:     Head: Normocephalic and atraumatic.  Eyes:     Conjunctiva/sclera: Conjunctivae normal.     Pupils: Pupils are equal, round, and reactive to light.  Neck:     Vascular: No carotid bruit.  Cardiovascular:     Rate and Rhythm: Normal rate and regular rhythm.     Heart sounds: Normal heart sounds.  Pulmonary:     Effort: Pulmonary effort is normal.     Breath sounds: Normal breath sounds.  Abdominal:     Palpations: Abdomen is soft. There is no pulsatile mass.     Tenderness: There is no abdominal tenderness.  Musculoskeletal:     Right lower leg: No edema.     Left lower leg: No edema.  Skin:    General: Skin is warm and dry.  Neurological:     Mental Status: She is alert and oriented to person, place, and time.  Psychiatric:        Mood and Affect: Mood normal.        Behavior: Behavior normal.        Assessment & Plan:  Yolanda Jennings is a 51 y.o. female . No diagnosis found.   No orders of the defined types were placed in this encounter.  There are no Patient Instructions on file for this visit.    Signed,   [name], MD [place] 02/07/22 2:18 PM

## 2022-02-08 ENCOUNTER — Encounter: Payer: Self-pay | Admitting: Family Medicine

## 2022-02-09 ENCOUNTER — Encounter: Payer: Self-pay | Admitting: Family Medicine

## 2022-02-09 DIAGNOSIS — R7303 Prediabetes: Secondary | ICD-10-CM

## 2022-02-09 MED ORDER — BLOOD GLUCOSE METER KIT: PACK | 0 refills | Status: AC

## 2022-02-09 NOTE — Telephone Encounter (Signed)
Replied with lab note, will have her monitor home readings to watch for significant changes but overall A1c indicates prediabetes..  Meter ordered.

## 2022-02-09 NOTE — Telephone Encounter (Signed)
Labs done 02/07/22

## 2022-03-11 ENCOUNTER — Other Ambulatory Visit: Payer: Self-pay | Admitting: Family Medicine

## 2022-03-11 DIAGNOSIS — I10 Essential (primary) hypertension: Secondary | ICD-10-CM

## 2022-04-05 ENCOUNTER — Other Ambulatory Visit: Payer: Self-pay | Admitting: Family Medicine

## 2022-04-05 DIAGNOSIS — I10 Essential (primary) hypertension: Secondary | ICD-10-CM

## 2022-04-17 ENCOUNTER — Other Ambulatory Visit: Payer: Self-pay | Admitting: Family Medicine

## 2022-04-17 DIAGNOSIS — I10 Essential (primary) hypertension: Secondary | ICD-10-CM

## 2022-05-04 ENCOUNTER — Telehealth: Payer: Self-pay | Admitting: Lab

## 2022-05-04 NOTE — Telephone Encounter (Signed)
Received paperwork from the front and put in Dr. Mancel Bale folder to review on 05/04/2022

## 2022-05-15 ENCOUNTER — Telehealth: Payer: Self-pay | Admitting: Lab

## 2022-05-15 LAB — HM MAMMOGRAPHY

## 2022-05-15 NOTE — Telephone Encounter (Signed)
Faxed over paperwork to [place] at [phone]

## 2022-05-15 NOTE — Telephone Encounter (Signed)
No note needed 

## 2022-05-15 NOTE — Telephone Encounter (Signed)
No not needed

## 2022-05-16 ENCOUNTER — Telehealth: Payer: Self-pay | Admitting: Lab

## 2022-05-16 NOTE — Telephone Encounter (Signed)
Received paperwork from the front from [facility] mamm[facility] and put it in your folder for review

## 2022-05-18 ENCOUNTER — Other Ambulatory Visit: Payer: Self-pay

## 2022-05-18 ENCOUNTER — Encounter: Payer: Self-pay | Admitting: Family Medicine

## 2022-05-22 ENCOUNTER — Telehealth: Payer: Self-pay | Admitting: Lab

## 2022-05-22 ENCOUNTER — Telehealth: Payer: Self-pay

## 2022-05-22 LAB — HM MAMMOGRAPHY

## 2022-05-22 NOTE — Telephone Encounter (Signed)
Paperwork completed and placed in fax bin at back nurse station  

## 2022-05-22 NOTE — Telephone Encounter (Signed)
Placed an urgent order request in your sign folder states this is the second request for this order

## 2022-05-22 NOTE — Telephone Encounter (Signed)
Faxed over information to [facility] to [phone]

## 2022-05-23 ENCOUNTER — Encounter: Payer: Self-pay | Admitting: Family Medicine

## 2022-06-12 ENCOUNTER — Encounter: Payer: Self-pay | Admitting: Family Medicine

## 2022-06-12 ENCOUNTER — Other Ambulatory Visit: Payer: Self-pay | Admitting: Family Medicine

## 2022-06-12 DIAGNOSIS — I10 Essential (primary) hypertension: Secondary | ICD-10-CM

## 2022-06-30 ENCOUNTER — Other Ambulatory Visit: Payer: Self-pay | Admitting: Family Medicine

## 2022-06-30 DIAGNOSIS — I10 Essential (primary) hypertension: Secondary | ICD-10-CM

## 2022-07-14 ENCOUNTER — Other Ambulatory Visit: Payer: Self-pay | Admitting: Family Medicine

## 2022-07-14 DIAGNOSIS — I10 Essential (primary) hypertension: Secondary | ICD-10-CM

## 2022-08-13 ENCOUNTER — Encounter: Payer: Self-pay | Admitting: Family Medicine

## 2022-08-13 ENCOUNTER — Ambulatory Visit (INDEPENDENT_AMBULATORY_CARE_PROVIDER_SITE_OTHER): Payer: 59 | Admitting: Family Medicine

## 2022-08-13 VITALS — BP 130/72 | HR 78 | Temp 97.8°F | Ht 64.0 in | Wt 166.8 lb

## 2022-08-13 DIAGNOSIS — G8929 Other chronic pain: Secondary | ICD-10-CM

## 2022-08-13 DIAGNOSIS — I10 Essential (primary) hypertension: Secondary | ICD-10-CM | POA: Diagnosis not present

## 2022-08-13 DIAGNOSIS — M25561 Pain in right knee: Secondary | ICD-10-CM

## 2022-08-13 DIAGNOSIS — Z13220 Encounter for screening for lipoid disorders: Secondary | ICD-10-CM | POA: Diagnosis not present

## 2022-08-13 DIAGNOSIS — Z23 Encounter for immunization: Secondary | ICD-10-CM

## 2022-08-13 DIAGNOSIS — Z0000 Encounter for general adult medical examination without abnormal findings: Secondary | ICD-10-CM | POA: Diagnosis not present

## 2022-08-13 DIAGNOSIS — R7303 Prediabetes: Secondary | ICD-10-CM | POA: Diagnosis not present

## 2022-08-13 LAB — HEMOGLOBIN A1C: Hgb A1c MFr Bld: 5.7 % (ref 4.6–6.5)

## 2022-08-13 MED ORDER — CARVEDILOL 25 MG PO TABS: 25.0000 mg | ORAL_TABLET | Freq: Two times a day (BID) | ORAL | 3 refills | Status: DC

## 2022-08-13 MED ORDER — CLONIDINE HCL 0.1 MG PO TABS: 0.1000 mg | ORAL_TABLET | Freq: Two times a day (BID) | ORAL | 3 refills | Status: DC

## 2022-08-13 MED ORDER — AMLODIPINE BESYLATE 10 MG PO TABS: 10.0000 mg | ORAL_TABLET | Freq: Every day | ORAL | 3 refills | Status: DC

## 2022-08-13 NOTE — Patient Instructions (Signed)
Prechild with weight loss, keep up the good work.  I do expect less need for blood pressure meds as weight continues to improve but based on today's reading I do not want to change any changes quite yet.  Follow-up with me in 6 months but if you notice lower blood pressures in the meantime, happy to see you to discuss med changes sooner.  If there are any concerns on labs I will let you know.  If knee pain becomes more persistent or limiting activities further, I would recommend updated imaging and meeting with orthopedist.  Let me know.  Take care.   Preventive Care 53-57 Years Old, Female Preventive care refers to lifestyle choices and visits with your health care provider that can promote health and wellness. Preventive care visits are also called wellness exams. What can I expect for my preventive care visit? Counseling Your health care provider may ask you questions about your: Medical history, including: Past medical problems. Family medical history. Pregnancy history. Current health, including: Menstrual cycle. Method of birth control. Emotional well-being. Home life and relationship well-being. Sexual activity and sexual health. Lifestyle, including: Alcohol, nicotine or tobacco, and drug use. Access to firearms. Diet, exercise, and sleep habits. Work and work Statistician. Sunscreen use. Safety issues such as seatbelt and bike helmet use. Physical exam Your health care provider will check your: Height and weight. These may be used to calculate your BMI (body mass index). BMI is a measurement that tells if you are at a healthy weight. Waist circumference. This measures the distance around your waistline. This measurement also tells if you are at a healthy weight and may help predict your risk of certain diseases, such as type 2 diabetes and high blood pressure. Heart rate and blood pressure. Body temperature. Skin for abnormal spots. What immunizations do I need?  Vaccines are  usually given at various ages, according to a schedule. Your health care provider will recommend vaccines for you based on your age, medical history, and lifestyle or other factors, such as travel or where you work. What tests do I need? Screening Your health care provider may recommend screening tests for certain conditions. This may include: Lipid and cholesterol levels. Diabetes screening. This is done by checking your blood sugar (glucose) after you have not eaten for a while (fasting). Pelvic exam and Pap test. Hepatitis B test. Hepatitis C test. HIV (human immunodeficiency virus) test. STI (sexually transmitted infection) testing, if you are at risk. Lung cancer screening. Colorectal cancer screening. Mammogram. Talk with your health care provider about when you should start having regular mammograms. This may depend on whether you have a family history of breast cancer. BRCA-related cancer screening. This may be done if you have a family history of breast, ovarian, tubal, or peritoneal cancers. Bone density scan. This is done to screen for osteoporosis. Talk with your health care provider about your test results, treatment options, and if necessary, the need for more tests. Follow these instructions at home: Eating and drinking  Eat a diet that includes fresh fruits and vegetables, whole grains, lean protein, and low-fat dairy products. Take vitamin and mineral supplements as recommended by your health care provider. Do not drink alcohol if: Your health care provider tells you not to drink. You are pregnant, may be pregnant, or are planning to become pregnant. If you drink alcohol: Limit how much you have to 0-1 drink a day. Know how much alcohol is in your drink. In the [place] one drink equals  one 12 oz bottle of beer (355 mL), one 5 oz glass of wine (148 mL), or one 1 oz glass of hard liquor (44 mL). Lifestyle Brush your teeth every morning and night with fluoride toothpaste.  Floss one time each day. Exercise for at least 30 minutes 5 or more days each week. Do not use any products that contain nicotine or tobacco. These products include cigarettes, chewing tobacco, and vaping devices, such as e-cigarettes. If you need help quitting, ask your health care provider. Do not use drugs. If you are sexually active, practice safe sex. Use a condom or other form of protection to prevent STIs. If you do not wish to become pregnant, use a form of birth control. If you plan to become pregnant, see your health care provider for a prepregnancy visit. Take aspirin only as told by your health care provider. Make sure that you understand how much to take and what form to take. Work with your health care provider to find out whether it is safe and beneficial for you to take aspirin daily. Find healthy ways to manage stress, such as: Meditation, yoga, or listening to music. Journaling. Talking to a trusted person. Spending time with friends and family. Minimize exposure to UV radiation to reduce your risk of skin cancer. Safety Always wear your seat belt while driving or riding in a vehicle. Do not drive: If you have been drinking alcohol. Do not ride with someone who has been drinking. When you are tired or distracted. While texting. If you have been using any mind-altering substances or drugs. Wear a helmet and other protective equipment during sports activities. If you have firearms in your house, make sure you follow all gun safety procedures. Seek help if you have been physically or sexually abused. What's next? Visit your health care provider once a year for an annual wellness visit. Ask your health care provider how often you should have your eyes and teeth checked. Stay up to date on all vaccines. This information is not intended to replace advice given to you by your health care provider. Make sure you discuss any questions you have with your health care  provider. Document Revised: 12/21/2020 Document Reviewed: 12/21/2020 Elsevier Patient Education  [place].

## 2022-08-13 NOTE — Progress Notes (Signed)
Subjective:  Patient ID: Yolanda Jennings, female    DOB: 09-Apr-1971  Age: 52 y.o. MRN: [id number]  CC:  Chief Complaint  Patient presents with   Annual Exam    Pt is doing well would like handicap placard form filled out if possible today     HPI [name] presents for Annual Exam  Hypertension: Amlodipine 10 mg daily, coreg '25mg'$  BID, clonidine 0.'1mg'$  BID. Potassium supplement previously.none recent.  Home readings: none usually.  BP Readings from Last 3 Encounters:  08/13/22 130/72  02/07/22 (!) 144/72  08/30/21 130/78   Lab Results  Component Value Date   CREATININE 1.04 02/07/2022    Prediabetes: Home readings 70-88. Lost weight with sugar intake decrease, diet changes.  Lab Results  Component Value Date   HGBA1C 5.9 02/07/2022   Wt Readings from Last 3 Encounters:  08/13/22 166 lb 12.8 oz (75.7 kg)  02/07/22 179 lb 6.4 oz (81.4 kg)  08/30/21 178 lb 6.4 oz (80.9 kg)     Chronic knee pain Occasional pain meds - tylenol or rare alleve. Trouble walking low distances. No assistive device, but wearing knee brace.  Requests handicap placard.      08/13/2022    1:08 PM 02/07/2022    1:43 PM 08/30/2021    1:39 PM 08/17/2021    9:47 AM 04/20/2021    3:58 PM  Depression screen PHQ 2/9  Decreased Interest 0 0 0 0 0  Down, Depressed, Hopeless 0 0 0 0 0  PHQ - 2 Score 0 0 0 0 0  Altered sleeping  1   0  Tired, decreased energy  0   0  Change in appetite  0   0  Feeling bad or failure about yourself   0   0  Trouble concentrating  0   0  Moving slowly or fidgety/restless  0   0  Suicidal thoughts  0   0  PHQ-9 Score  1   0  Difficult doing work/chores     Not difficult at all    Health Maintenance  Topic Date Due   INFLUENZA VACCINE  02/06/2022   COVID-19 Vaccine (1) 08/29/2022 (Originally 07/15/1971)   Zoster Vaccines- Shingrix (1 of 2) 11/11/2022 (Originally 01/13/2020)   Hepatitis C Screening  08/14/2023 (Originally 01/13/1988)   HIV Screening   08/14/2023 (Originally 01/12/1985)   MAMMOGRAM  05/22/2024   PAP SMEAR-Modifier  08/17/2024   DTaP/Tdap/Td (2 - Td or Tdap) 09/06/2025   COLONOSCOPY (Pts 45-64yr Insurance coverage will need to be confirmed)  05/19/2030   HPV VACCINES  Aged Out  Previous abnormal mammogram, ultrasound guided biopsy on 05/22/2022 on the right, pathology results fibroadenoma.  Routine annual screening with mammogram in 1 year recommended in October of this year. Pap in 2023, Dr. MSabra Heck  Negative, negative HPV. Colonoscopy 05/19/2020, multiple polyps removed.  Hyperplastic, lymphoid aggregate, no dysplasia.  Benign, repeat colonoscopy 10 years.  Immunization History  Administered Date(s) Administered   Influenza Split 04/08/2017   Influenza-Unspecified 04/08/2014, 04/09/2015, 04/18/2016   Tdap 09/07/2015  Shingrix - declines.  Covid vaccine - declines.  Flu vaccine - today.   No results found. Wears glasses. Optho appt in November. Cataracts. Monitoring.   Dental: every 6 months.   Alcohol: rare. 3 per weekend.   Tobacco: none  Exercise: at work. 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 ++   History Patient Active Problem List   Diagnosis Date Noted   Menorrhagia with irregular cycle  09/02/2021   Intramural uterine fibroid 09/02/2021   Iron deficiency anemia due to chronic blood loss 09/02/2021   Cervical myofascial pain syndrome 07/20/2020   Obesity (BMI 30.0-34.9) 08/22/2014   Microcytic anemia 09/03/2013   Headache 09/03/2013   HTN (hypertension) 08/28/2013   Past Medical History:  Diagnosis Date   Allergy    Hypertension    Sickle cell trait ([place])    Tobacco abuse    Stopped smoking 08/24/13   Past Surgical History:  Procedure Laterality Date   BREAST BIOPSY Left 2011   HERNIA REPAIR  2013   TUBAL LIGATION     2003   Allergies  Allergen Reactions   Ace Inhibitors Swelling and Other (See Comments)    Reaction:  Tongue swelling    Lisinopril Swelling and Other  (See Comments)    Reaction:  Tongue swelling    Prior to Admission medications   Medication Sig Start Date End Date Taking? Authorizing Provider  amLODipine (NORVASC) 10 MG tablet Take 1 tablet by mouth once daily 06/12/22  Yes [name], MD  blood glucose meter kit and supplies Dispense based on patient and insurance preference. Use once daily as directed. 02/09/22  Yes [name], MD  carvedilol (COREG) 25 MG tablet TAKE 1 TABLET BY MOUTH TWICE DAILY WITH A MEAL 07/02/22  Yes [name], MD  cloNIDine (CATAPRES) 0.1 MG tablet Take 1 tablet by mouth twice daily 07/16/22  Yes [name], MD  cyclobenzaprine (FLEXERIL) 10 MG tablet Take 1 tablet (10 mg total) by mouth at bedtime. 07/20/20  Yes Melvenia Beam, MD  potassium chloride SA (KLOR-CON) 20 MEQ tablet Take 2 tablets (40 mEq total) by mouth daily. 12/07/20  Yes [name], MD   Social History   Socioeconomic History   Marital status: Single    Spouse name: Not on file   Number of children: Not on file   Years of education: Not on file   Highest education level: Not on file  Occupational History   Occupation: Freight forwarder  Tobacco Use   Smoking status: Former    Years: 20.00    Types: Cigarettes    Quit date: 08/24/2013    Years since quitting: 8.9   Smokeless tobacco: Never  Vaping Use   Vaping Use: Never used  Substance and Sexual Activity   Alcohol use: Yes    Alcohol/week: 0.0 standard drinks of alcohol    Comment: occasional   Drug use: No   Sexual activity: Yes    Birth control/protection: Condom  Other Topics Concern   Not on file  Social History Narrative   Marital status: Single      Children: 2 children; no grandchildren.      Lives: with one child      Employment: Freight forwarder      Tobacco: quit 08/2013.       Alcohol:  Socially.      Exercise: Yes      Education: [employer].      Right handed   Caffeine: coffee in the winter, 1-2 cups/day   Social  Determinants of Health   Financial Resource Strain: Not on file  Food Insecurity: Not on file  Transportation Needs: Not on file  Physical Activity: Not on file  Stress: Not on file  Social Connections: Not on file  Intimate Partner Violence: Not on file    Review of Systems 13 point review of systems per patient health survey noted.  Negative other than as indicated above or in HPI.   Objective:   Vitals:   08/13/22 1311  BP: 130/72  Pulse: 78  Temp: 97.8 F (36.6 C)  TempSrc: Temporal  SpO2: 100%  Weight: 166 lb 12.8 oz (75.7 kg)  Height: '5\' 4"'$  (1.626 m)     Physical Exam Constitutional:      Appearance: She is well-developed.  HENT:     Head: Normocephalic and atraumatic.     Right Ear: External ear normal.     Left Ear: External ear normal.  Eyes:     Conjunctiva/sclera: Conjunctivae normal.     Pupils: Pupils are equal, round, and reactive to light.  Neck:     Thyroid: No thyromegaly.  Cardiovascular:     Rate and Rhythm: Normal rate and regular rhythm.     Heart sounds: Normal heart sounds. No murmur heard. Pulmonary:     Effort: Pulmonary effort is normal. No respiratory distress.     Breath sounds: Normal breath sounds. No wheezing.  Abdominal:     General: Bowel sounds are normal.     Palpations: Abdomen is soft.     Tenderness: There is no abdominal tenderness.  Musculoskeletal:        General: Normal range of motion.     Cervical back: Normal range of motion and neck supple.     Comments: Right knee, overall intact range of motion, discomfort over the lateral joint line with some bony prominence.  No apparent crepitus, no appreciable swelling.   Lymphadenopathy:     Cervical: No cervical adenopathy.  Skin:    General: Skin is warm and dry.     Findings: No rash.  Neurological:     Mental Status: She is alert and oriented to person, place, and time.  Psychiatric:        Behavior: Behavior normal.        Thought Content: Thought content normal.         Assessment & Plan:  Mallerie Blok is a 52 y.o. female . Annual physical exam - Plan: Comprehensive metabolic panel, Lipid panel, Hemoglobin A1c  - -anticipatory guidance as below in AVS, screening labs above. Health maintenance items as above in HPI discussed/recommended as applicable.   Essential hypertension - Plan: Comprehensive metabolic panel, amLODipine (NORVASC) 10 MG tablet, carvedilol (COREG) 25 MG tablet, cloNIDine (CATAPRES) 0.1 MG tablet -  Stable, tolerating current regimen. Medications refilled. Labs pending as above.   Prediabetes - Plan: Hemoglobin A1c  -Commended on weight loss.  Check updated A1c.  Anticipate decreased blood pressure med needed as weight improves.  No changes for now.  Screening for hyperlipidemia - Plan: Lipid panel  Needs flu shot - Plan: Flu Vaccine QUAD 6+ mos PF IM (Fluarix Quad PF)  Right knee pain, chronic, overall stable with episodic Tylenol or rare NSAID.  RTC precautions if more persistent or worsening pain, or change in debility/limitations.  Handicap placard paperwork provided.  Meds ordered this encounter  Medications   amLODipine (NORVASC) 10 MG tablet    Sig: Take 1 tablet (10 mg total) by mouth daily.    Dispense:  90 tablet    Refill:  3   carvedilol (COREG) 25 MG tablet    Sig: Take 1 tablet (25 mg total) by mouth 2 (two) times daily with a meal.    Dispense:  180 tablet    Refill:  3   cloNIDine (CATAPRES) 0.1 MG tablet    Sig: Take 1  tablet (0.1 mg total) by mouth 2 (two) times daily.    Dispense:  180 tablet    Refill:  3   Patient Instructions  Prechild with weight loss, keep up the good work.  I do expect less need for blood pressure meds as weight continues to improve but based on today's reading I do not want to change any changes quite yet.  Follow-up with me in 6 months but if you notice lower blood pressures in the meantime, happy to see you to discuss med changes sooner.  If there are any concerns on  labs I will let you know.  If knee pain becomes more persistent or limiting activities further, I would recommend updated imaging and meeting with orthopedist.  Let me know.  Take care.   Preventive Care 29-31 Years Old, Female Preventive care refers to lifestyle choices and visits with your health care provider that can promote health and wellness. Preventive care visits are also called wellness exams. What can I expect for my preventive care visit? Counseling Your health care provider may ask you questions about your: Medical history, including: Past medical problems. Family medical history. Pregnancy history. Current health, including: Menstrual cycle. Method of birth control. Emotional well-being. Home life and relationship well-being. Sexual activity and sexual health. Lifestyle, including: Alcohol, nicotine or tobacco, and drug use. Access to firearms. Diet, exercise, and sleep habits. Work and work Statistician. Sunscreen use. Safety issues such as seatbelt and bike helmet use. Physical exam Your health care provider will check your: Height and weight. These may be used to calculate your BMI (body mass index). BMI is a measurement that tells if you are at a healthy weight. Waist circumference. This measures the distance around your waistline. This measurement also tells if you are at a healthy weight and may help predict your risk of certain diseases, such as type 2 diabetes and high blood pressure. Heart rate and blood pressure. Body temperature. Skin for abnormal spots. What immunizations do I need?  Vaccines are usually given at various ages, according to a schedule. Your health care provider will recommend vaccines for you based on your age, medical history, and lifestyle or other factors, such as travel or where you work. What tests do I need? Screening Your health care provider may recommend screening tests for certain conditions. This may include: Lipid and  cholesterol levels. Diabetes screening. This is done by checking your blood sugar (glucose) after you have not eaten for a while (fasting). Pelvic exam and Pap test. Hepatitis B test. Hepatitis C test. HIV (human immunodeficiency virus) test. STI (sexually transmitted infection) testing, if you are at risk. Lung cancer screening. Colorectal cancer screening. Mammogram. Talk with your health care provider about when you should start having regular mammograms. This may depend on whether you have a family history of breast cancer. BRCA-related cancer screening. This may be done if you have a family history of breast, ovarian, tubal, or peritoneal cancers. Bone density scan. This is done to screen for osteoporosis. Talk with your health care provider about your test results, treatment options, and if necessary, the need for more tests. Follow these instructions at home: Eating and drinking  Eat a diet that includes fresh fruits and vegetables, whole grains, lean protein, and low-fat dairy products. Take vitamin and mineral supplements as recommended by your health care provider. Do not drink alcohol if: Your health care provider tells you not to drink. You are pregnant, may be pregnant, or are planning to become  pregnant. If you drink alcohol: Limit how much you have to 0-1 drink a day. Know how much alcohol is in your drink. In the [place] one drink equals one 12 oz bottle of beer (355 mL), one 5 oz glass of wine (148 mL), or one 1 oz glass of hard liquor (44 mL). Lifestyle Brush your teeth every morning and night with fluoride toothpaste. Floss one time each day. Exercise for at least 30 minutes 5 or more days each week. Do not use any products that contain nicotine or tobacco. These products include cigarettes, chewing tobacco, and vaping devices, such as e-cigarettes. If you need help quitting, ask your health care provider. Do not use drugs. If you are sexually active, practice safe sex.  Use a condom or other form of protection to prevent STIs. If you do not wish to become pregnant, use a form of birth control. If you plan to become pregnant, see your health care provider for a prepregnancy visit. Take aspirin only as told by your health care provider. Make sure that you understand how much to take and what form to take. Work with your health care provider to find out whether it is safe and beneficial for you to take aspirin daily. Find healthy ways to manage stress, such as: Meditation, yoga, or listening to music. Journaling. Talking to a trusted person. Spending time with friends and family. Minimize exposure to UV radiation to reduce your risk of skin cancer. Safety Always wear your seat belt while driving or riding in a vehicle. Do not drive: If you have been drinking alcohol. Do not ride with someone who has been drinking. When you are tired or distracted. While texting. If you have been using any mind-altering substances or drugs. Wear a helmet and other protective equipment during sports activities. If you have firearms in your house, make sure you follow all gun safety procedures. Seek help if you have been physically or sexually abused. What's next? Visit your health care provider once a year for an annual wellness visit. Ask your health care provider how often you should have your eyes and teeth checked. Stay up to date on all vaccines. This information is not intended to replace advice given to you by your health care provider. Make sure you discuss any questions you have with your health care provider. Document Revised: 12/21/2020 Document Reviewed: 12/21/2020 Elsevier Patient Education  [place],   [name], MD [facility] 08/13/22 1:40 PM

## 2022-08-14 LAB — COMPREHENSIVE METABOLIC PANEL
ALT: 8 U/L (ref 0–35)
AST: 14 U/L (ref 0–37)
Albumin: 4.3 g/dL (ref 3.5–5.2)
Alkaline Phosphatase: 94 U/L (ref 39–117)
BUN: 11 mg/dL (ref 6–23)
CO2: 20 mEq/L (ref 19–32)
Calcium: 9.5 mg/dL (ref 8.4–10.5)
Chloride: 104 mEq/L (ref 96–112)
Creatinine, Ser: 1.19 mg/dL (ref 0.40–1.20)
GFR: 52.54 mL/min — ABNORMAL LOW (ref >60.00)
Glucose, Bld: 86 mg/dL (ref 70–99)
Potassium: 3.8 mEq/L (ref 3.5–5.1)
Sodium: 136 mEq/L (ref 135–145)
Total Bilirubin: 0.5 mg/dL (ref 0.2–1.2)
Total Protein: 8.5 g/dL — ABNORMAL HIGH (ref 6.0–8.3)

## 2022-08-14 LAB — LIPID PANEL
Cholesterol: 188 mg/dL (ref 0–200)
HDL: 79.6 mg/dL (ref >39.00)
LDL Cholesterol: 88 mg/dL (ref 0–99)
NonHDL: 108.78
Total CHOL/HDL Ratio: 2
Triglycerides: 106 mg/dL (ref 0.0–149.0)
VLDL: 21.2 mg/dL (ref 0.0–40.0)

## 2023-01-16 ENCOUNTER — Other Ambulatory Visit: Payer: Self-pay | Admitting: Family Medicine

## 2023-01-16 DIAGNOSIS — E876 Hypokalemia: Secondary | ICD-10-CM

## 2023-01-16 NOTE — Telephone Encounter (Signed)
Patient is requesting a refill of the following medications: Requested Prescriptions   Pending Prescriptions Disp Refills   potassium chloride SA (KLOR-CON M) 20 MEQ tablet [Pharmacy Med Name: Potassium Chloride Crys ER 20 MEQ Oral Tablet Extended Release] 60 tablet 0    Sig: Take 2 tablets by mouth once daily    Date of patient request: 01/16/23 Last office visit: 08/13/22 Date of last refill: 12/07/20 Last refill amount: 60   Appears pt has not had this in several years is this a reasonable refill or should pt have an appointment

## 2023-01-16 NOTE — Telephone Encounter (Signed)
Please clarify with patient.  When discussed in February she had been off potassium supplement at that time and potassium level was normal.  Has she been taking potassium?Marland Kitchen

## 2023-02-11 ENCOUNTER — Encounter: Payer: Self-pay | Admitting: Family Medicine

## 2023-02-11 ENCOUNTER — Ambulatory Visit (INDEPENDENT_AMBULATORY_CARE_PROVIDER_SITE_OTHER): Payer: 59 | Admitting: Family Medicine

## 2023-02-11 VITALS — BP 122/68 | HR 100 | Temp 97.8°F | Ht 64.0 in | Wt 163.8 lb

## 2023-02-11 DIAGNOSIS — Z13220 Encounter for screening for lipoid disorders: Secondary | ICD-10-CM

## 2023-02-11 DIAGNOSIS — I10 Essential (primary) hypertension: Secondary | ICD-10-CM

## 2023-02-11 DIAGNOSIS — R7303 Prediabetes: Secondary | ICD-10-CM

## 2023-02-11 DIAGNOSIS — R739 Hyperglycemia, unspecified: Secondary | ICD-10-CM | POA: Diagnosis not present

## 2023-02-11 LAB — LIPID PANEL
Cholesterol: 191 mg/dL (ref 0–200)
HDL: 75.2 mg/dL (ref >39.00)
LDL Cholesterol: 88 mg/dL (ref 0–99)
NonHDL: 115.32
Total CHOL/HDL Ratio: 3
Triglycerides: 137 mg/dL (ref 0.0–149.0)
VLDL: 27.4 mg/dL (ref 0.0–40.0)

## 2023-02-11 LAB — COMPREHENSIVE METABOLIC PANEL
ALT: 12 U/L (ref 0–35)
AST: 16 U/L (ref 0–37)
Albumin: 4.2 g/dL (ref 3.5–5.2)
Alkaline Phosphatase: 71 U/L (ref 39–117)
BUN: 18 mg/dL (ref 6–23)
CO2: 27 mEq/L (ref 19–32)
Calcium: 9.6 mg/dL (ref 8.4–10.5)
Chloride: 102 mEq/L (ref 96–112)
Creatinine, Ser: 1.57 mg/dL — ABNORMAL HIGH (ref 0.40–1.20)
GFR: 37.55 mL/min — ABNORMAL LOW (ref >60.00)
Glucose, Bld: 96 mg/dL (ref 70–99)
Potassium: 3.5 mEq/L (ref 3.5–5.1)
Sodium: 138 mEq/L (ref 135–145)
Total Bilirubin: 0.5 mg/dL (ref 0.2–1.2)
Total Protein: 8 g/dL (ref 6.0–8.3)

## 2023-02-11 LAB — HEMOGLOBIN A1C: Hgb A1c MFr Bld: 5.6 % (ref 4.6–6.5)

## 2023-02-11 NOTE — Patient Instructions (Addendum)
Any concerns on labs I will let you know.  Potassium looked okay last visit so you might be okay with just potassium rich foods but I will check those levels today.  Keep up the good work with diet, weight has improved and I expect the prediabetes test to be normal.  Follow-up in 6 months for physical but please let me know if you have any questions or concerns in the meantime.  Take care.

## 2023-02-11 NOTE — Progress Notes (Signed)
Subjective:  Patient ID: Yolanda Jennings, female    DOB: 1970/11/21  Age: 52 y.o. MRN: [id number]  CC:  Chief Complaint  Patient presents with   Medical Management of Chronic Issues    Pt is doing well notes some weight loss since last visit thought it might be concerning however last weight was about 4 lbs more than todays weight     HPI Yolanda Jennings presents for   Hypertension: Amlodipine 10 mg daily, carvedilol 25 mg twice daily, clonidine 0.1 mg twice daily.  Prior potassium supplement - normal off meds last visit. Taking otc K supplement past few days - usually through foods.  Home readings: 132-135/79-81.  BP Readings from Last 3 Encounters:  02/11/23 122/68  08/13/22 130/72  02/07/22 (!) 144/72   Lab Results  Component Value Date   CREATININE 1.19 08/13/2022   Lab Results  Component Value Date   K 3.8 08/13/2022   Prediabetes: Weight has improved by 3 pounds since last visit in February.  Had made some dietary changes. Eating vegetables, avoiding red meat. Eating vegetables, grilled ckn.   Lab Results  Component Value Date   HGBA1C 5.7 08/13/2022   Wt Readings from Last 3 Encounters:  02/11/23 163 lb 12.8 oz (74.3 kg)  08/13/22 166 lb 12.8 oz (75.7 kg)  02/07/22 179 lb 6.4 oz (81.4 kg)   Lab Results  Component Value Date   CHOL 188 08/13/2022   HDL 79.60 08/13/2022   LDLCALC 88 08/13/2022   TRIG 106.0 08/13/2022   CHOLHDL 2 08/13/2022   Lab Results  Component Value Date   ALT 8 08/13/2022   AST 14 08/13/2022   ALKPHOS 94 08/13/2022   BILITOT 0.5 08/13/2022          History Patient Active Problem List   Diagnosis Date Noted   Menorrhagia with irregular cycle 09/02/2021   Intramural uterine fibroid 09/02/2021   Iron deficiency anemia due to chronic blood loss 09/02/2021   Cervical myofascial pain syndrome 07/20/2020   Obesity (BMI 30.0-34.9) 08/22/2014   Microcytic anemia 09/03/2013   Headache 09/03/2013   HTN (hypertension)  08/28/2013   Past Medical History:  Diagnosis Date   Allergy    Hypertension    Sickle cell trait ([facility])    Tobacco abuse    Stopped smoking 08/24/13   Past Surgical History:  Procedure Laterality Date   BREAST BIOPSY Left 2011   HERNIA REPAIR  2013   TUBAL LIGATION     2003   Allergies  Allergen Reactions   Ace Inhibitors Swelling and Other (See Comments)    Reaction:  Tongue swelling    Lisinopril Swelling and Other (See Comments)    Reaction:  Tongue swelling    Prior to Admission medications   Medication Sig Start Date End Date Taking? Authorizing Provider  amLODipine (NORVASC) 10 MG tablet Take 1 tablet (10 mg total) by mouth daily. 08/13/22  Yes Shade Flood, MD  blood glucose meter kit and supplies Dispense based on patient and insurance preference. Use once daily as directed. 02/09/22  Yes Shade Flood, MD  carvedilol (COREG) 25 MG tablet Take 1 tablet (25 mg total) by mouth 2 (two) times daily with a meal. 08/13/22  Yes Shade Flood, MD  cloNIDine (CATAPRES) 0.1 MG tablet Take 1 tablet (0.1 mg total) by mouth 2 (two) times daily. 08/13/22  Yes Shade Flood, MD  cyclobenzaprine (FLEXERIL) 10 MG tablet Take 1 tablet (10 mg total) by  mouth at bedtime. 07/20/20  Yes Anson Fret, MD  potassium chloride SA (KLOR-CON) 20 MEQ tablet Take 2 tablets (40 mEq total) by mouth daily. 12/07/20  Yes Shade Flood, MD   Social History   Socioeconomic History   Marital status: Single    Spouse name: Not on file   Number of children: Not on file   Years of education: Not on file   Highest education level: Not on file  Occupational History   Occupation: Estate agent  Tobacco Use   Smoking status: Former    Current packs/day: 0.00    Types: Cigarettes    Start date: 08/24/1993    Quit date: 08/24/2013    Years since quitting: 9.4   Smokeless tobacco: Never  Vaping Use   Vaping status: Never Used  Substance and Sexual Activity   Alcohol use: Yes     Alcohol/week: 0.0 standard drinks of alcohol    Comment: occasional   Drug use: No   Sexual activity: Yes    Birth control/protection: Condom  Other Topics Concern   Not on file  Social History Narrative   Marital status: Single      Children: 2 children; no grandchildren.      Lives: with one child      Employment: Estate agent      Tobacco: quit 08/2013.       Alcohol:  Socially.      Exercise: Yes      Education: [employer].      Right handed   Caffeine: coffee in the winter, 1-2 cups/day   Social Determinants of Health   Financial Resource Strain: Not on file  Food Insecurity: Not on file  Transportation Needs: Not on file  Physical Activity: Not on file  Stress: Not on file  Social Connections: Not on file  Intimate Partner Violence: Not on file    Review of Systems  Constitutional:  Negative for fatigue and unexpected weight change.  Respiratory:  Negative for chest tightness and shortness of breath.   Cardiovascular:  Negative for chest pain, palpitations and leg swelling.  Gastrointestinal:  Negative for abdominal pain and blood in stool.  Neurological:  Negative for dizziness, syncope, light-headedness and headaches.     Objective:   Vitals:   02/11/23 1329  BP: 122/68  Pulse: 100  Temp: 97.8 F (36.6 C)  TempSrc: Temporal  SpO2: 100%  Weight: 163 lb 12.8 oz (74.3 kg)  Height: 5\' 4"  (1.626 m)     Physical Exam Vitals reviewed.  Constitutional:      Appearance: Normal appearance. She is well-developed.  HENT:     Head: Normocephalic and atraumatic.  Eyes:     Conjunctiva/sclera: Conjunctivae normal.     Pupils: Pupils are equal, round, and reactive to light.  Neck:     Vascular: No carotid bruit.  Cardiovascular:     Rate and Rhythm: Normal rate and regular rhythm.     Heart sounds: Normal heart sounds.  Pulmonary:     Effort: Pulmonary effort is normal.     Breath sounds: Normal breath sounds.  Abdominal:     Palpations: Abdomen  is soft. There is no pulsatile mass.     Tenderness: There is no abdominal tenderness.  Musculoskeletal:     Right lower leg: No edema.     Left lower leg: No edema.  Skin:    General: Skin is warm and dry.  Neurological:     Mental Status: She is  alert and oriented to person, place, and time.  Psychiatric:        Mood and Affect: Mood normal.        Behavior: Behavior normal.        Assessment & Plan:  Yolanda Jennings is a 52 y.o. female . Essential hypertension  -Well-controlled, continue same med regimen, check labs.  As weight continues to improve, can decrease meds, and will start with clonidine first.  RTC precautions, 75-month follow-up.  Screening for hyperlipidemia - Plan: Lipid panel, Comprehensive metabolic panel  -Overall stable readings previously, anticipate improvement as weight improves.  No new meds at this time.  Hyperglycemia - Plan: Hemoglobin A1c, Comprehensive metabolic panel Prediabetes  -Borderline A1c last visit, and with her improved diet and weight I suspect this will be normal and out of prediabetes range at this time.  Commended on her efforts, 4-month follow-up.  No orders of the defined types were placed in this encounter.  Patient Instructions  Any concerns on labs I will let you know.  Potassium looked okay last visit so you might be okay with just potassium rich foods but I will check those levels today.  Keep up the good work with diet, weight has improved and I expect the prediabetes test to be normal.  Follow-up in 6 months for physical but please let me know if you have any questions or concerns in the meantime.  Take care.     Signed,   Meredith Staggers, MD [facility], [facility] 02/11/23 2:14 PM

## 2023-02-12 ENCOUNTER — Telehealth: Payer: Self-pay

## 2023-02-12 DIAGNOSIS — N289 Disorder of kidney and ureter, unspecified: Secondary | ICD-10-CM

## 2023-02-12 NOTE — Addendum Note (Signed)
Addended by: Eldred Manges on: 02/12/2023 03:17 PM   Modules accepted: Orders

## 2023-02-12 NOTE — Telephone Encounter (Signed)
Pt is going to go to the [facility] lab location for a walk in

## 2023-02-12 NOTE — Telephone Encounter (Signed)
-----   Message from Shade Flood sent at 02/12/2023  2:32 PM EDT ----- Please call with results: Cholesterol levels were normal.  32-month blood sugar test is now in the high normal range, no longer prediabetes.  Keep up the good work.  Kidney function test was higher than previous readings.  Make sure to drink plenty of fluids, avoid any NSAIDs like Advil or Aleve and please schedule her for lab visit and order BMP in the next 1 week for recheck -diagnosis of elevated serum creatinine.

## 2023-02-12 NOTE — Telephone Encounter (Signed)
BMP ordered

## 2023-02-19 ENCOUNTER — Other Ambulatory Visit (INDEPENDENT_AMBULATORY_CARE_PROVIDER_SITE_OTHER): Payer: 59

## 2023-02-19 DIAGNOSIS — N289 Disorder of kidney and ureter, unspecified: Secondary | ICD-10-CM

## 2023-02-19 LAB — BASIC METABOLIC PANEL
BUN: 20 mg/dL (ref 6–23)
CO2: 25 mEq/L (ref 19–32)
Calcium: 9.4 mg/dL (ref 8.4–10.5)
Chloride: 105 mEq/L (ref 96–112)
Creatinine, Ser: 1.47 mg/dL — ABNORMAL HIGH (ref 0.40–1.20)
GFR: 40.63 mL/min — ABNORMAL LOW (ref >60.00)
Glucose, Bld: 100 mg/dL — ABNORMAL HIGH (ref 70–99)
Potassium: 4 mEq/L (ref 3.5–5.1)
Sodium: 137 mEq/L (ref 135–145)

## 2023-02-20 ENCOUNTER — Telehealth: Payer: Self-pay

## 2023-02-20 ENCOUNTER — Other Ambulatory Visit: Payer: Self-pay

## 2023-02-20 DIAGNOSIS — R748 Abnormal levels of other serum enzymes: Secondary | ICD-10-CM

## 2023-02-20 NOTE — Telephone Encounter (Signed)
-----   Message from Shade Flood sent at 02/19/2023  9:17 PM EDT ----- Results sent by MyChart, but please schedule lab visit for BMP, diagnosis elevated creatinine in the next 2 to 3 weeks.

## 2023-03-08 ENCOUNTER — Other Ambulatory Visit (INDEPENDENT_AMBULATORY_CARE_PROVIDER_SITE_OTHER): Payer: 59

## 2023-03-08 DIAGNOSIS — R748 Abnormal levels of other serum enzymes: Secondary | ICD-10-CM | POA: Diagnosis not present

## 2023-03-08 LAB — BASIC METABOLIC PANEL
BUN: 13 mg/dL (ref 6–23)
CO2: 26 mEq/L (ref 19–32)
Calcium: 9.1 mg/dL (ref 8.4–10.5)
Chloride: 105 mEq/L (ref 96–112)
Creatinine, Ser: 1.07 mg/dL (ref 0.40–1.20)
GFR: 59.45 mL/min — ABNORMAL LOW (ref >60.00)
Glucose, Bld: 96 mg/dL (ref 70–99)
Potassium: 3.8 mEq/L (ref 3.5–5.1)
Sodium: 138 mEq/L (ref 135–145)

## 2023-04-17 ENCOUNTER — Encounter: Payer: Self-pay | Admitting: Family Medicine

## 2023-04-17 ENCOUNTER — Ambulatory Visit (INDEPENDENT_AMBULATORY_CARE_PROVIDER_SITE_OTHER): Payer: 59 | Admitting: Family Medicine

## 2023-04-17 VITALS — BP 132/82 | HR 64 | Temp 98.6°F | Wt 161.0 lb

## 2023-04-17 DIAGNOSIS — R351 Nocturia: Secondary | ICD-10-CM | POA: Diagnosis not present

## 2023-04-17 DIAGNOSIS — M545 Low back pain, unspecified: Secondary | ICD-10-CM

## 2023-04-17 MED ORDER — CYCLOBENZAPRINE HCL 5 MG PO TABS
5.0000 mg | ORAL_TABLET | Freq: Three times a day (TID) | ORAL | 1 refills | Status: DC | PRN
Start: 2023-04-17 — End: 2023-05-31

## 2023-04-17 MED ORDER — MELOXICAM 7.5 MG PO TABS
7.5000 mg | ORAL_TABLET | Freq: Every day | ORAL | 0 refills | Status: DC
Start: 2023-04-17 — End: 2023-05-31

## 2023-04-17 NOTE — Progress Notes (Signed)
Subjective:  Patient ID: Yolanda Jennings, female    DOB: 04/03/1971  Age: 52 y.o. MRN: [id number]  CC:  Chief Complaint  Patient presents with   Back Pain    2 weeks, both sides; Pt describes as constant pain that feels more so like her kidneys than something muscular. Denies any pain when urinating or the urge to go constantly. Pt has taken tylenol for pain but that did not help. Has been using a heating pad but does not feel this helps either.    HPI Yolanda Jennings presents for   Bilateral low back pain: Past 2 weeks, bilateral. Nki, no fall, no new exercise, no new activity,  No hematuria/frequency, urgency or dysuria. Some chronic nocturia -1-2x per night.  No bowel or bladder incontinence, no saddle anesthesia, no lower extremity weakness. No leg radiation of pain but to buttocks, or upper thigh at times. Physical work - Event organiser. No change in hours or type of work.  Some night sweats at times, not soaking sheets. No fevers. Few pound weight loss from last visit.  Flexeril has been tolerated in past.  No hx of CKD or PUD.   Treatments of Tylenol and heating pad with minimal relief - more with heat than tylenol.    History Patient Active Problem List   Diagnosis Date Noted   Menorrhagia with irregular cycle 09/02/2021   Intramural uterine fibroid 09/02/2021   Iron deficiency anemia due to chronic blood loss 09/02/2021   Cervical myofascial pain syndrome 07/20/2020   Obesity (BMI 30.0-34.9) 08/22/2014   Microcytic anemia 09/03/2013   Headache 09/03/2013   HTN (hypertension) 08/28/2013   Past Medical History:  Diagnosis Date   Allergy    Hypertension    Sickle cell trait (HCC)    Tobacco abuse    Stopped smoking 08/24/13   Past Surgical History:  Procedure Laterality Date   BREAST BIOPSY Left 2011   HERNIA REPAIR  2013   TUBAL LIGATION     2003   Allergies  Allergen Reactions   Ace Inhibitors Swelling and Other (See Comments)    Reaction:   Tongue swelling    Lisinopril Swelling and Other (See Comments)    Reaction:  Tongue swelling    Prior to Admission medications   Medication Sig Start Date End Date Taking? Authorizing Provider  amLODipine (NORVASC) 10 MG tablet Take 1 tablet (10 mg total) by mouth daily. 08/13/22  Yes Shade Flood, MD  blood glucose meter kit and supplies Dispense based on patient and insurance preference. Use once daily as directed. 02/09/22  Yes Shade Flood, MD  carvedilol (COREG) 25 MG tablet Take 1 tablet (25 mg total) by mouth 2 (two) times daily with a meal. 08/13/22  Yes Shade Flood, MD  cloNIDine (CATAPRES) 0.1 MG tablet Take 1 tablet (0.1 mg total) by mouth 2 (two) times daily. 08/13/22  Yes Shade Flood, MD  cyclobenzaprine (FLEXERIL) 10 MG tablet Take 1 tablet (10 mg total) by mouth at bedtime. 07/20/20  Yes Anson Fret, MD  potassium chloride SA (KLOR-CON) 20 MEQ tablet Take 2 tablets (40 mEq total) by mouth daily. 12/07/20  Yes Shade Flood, MD   Social History   Socioeconomic History   Marital status: Single    Spouse name: Not on file   Number of children: Not on file   Years of education: Not on file   Highest education level: Not on file  Occupational History  Occupation: Estate agent  Tobacco Use   Smoking status: Former    Current packs/day: 0.00    Types: Cigarettes    Start date: 08/24/1993    Quit date: 08/24/2013    Years since quitting: 9.6   Smokeless tobacco: Never  Vaping Use   Vaping status: Never Used  Substance and Sexual Activity   Alcohol use: Yes    Alcohol/week: 0.0 standard drinks of alcohol    Comment: occasional   Drug use: No   Sexual activity: Yes    Birth control/protection: Condom  Other Topics Concern   Not on file  Social History Narrative   Marital status: Single      Children: 2 children; no grandchildren.      Lives: with one child      Employment: Estate agent      Tobacco: quit 08/2013.       Alcohol:   Socially.      Exercise: Yes      Education: [employer].      Right handed   Caffeine: coffee in the winter, 1-2 cups/day   Social Determinants of Health   Financial Resource Strain: Not on file  Food Insecurity: Not on file  Transportation Needs: Not on file  Physical Activity: Not on file  Stress: Not on file  Social Connections: Not on file  Intimate Partner Violence: Not on file    Review of Systems   Objective:   Vitals:   04/17/23 1525  BP: 132/82  Pulse: 64  Temp: 98.6 F (37 C)  SpO2: 99%  Weight: 161 lb (73 kg)     Physical Exam Constitutional:      General: She is not in acute distress.    Appearance: Normal appearance. She is well-developed.  HENT:     Head: Normocephalic and atraumatic.  Cardiovascular:     Rate and Rhythm: Normal rate.  Pulmonary:     Effort: Pulmonary effort is normal.  Abdominal:     General: Abdomen is flat.     Palpations: Abdomen is soft.     Tenderness: There is no abdominal tenderness.  Musculoskeletal:     Comments: Lumbar spine, no specific bony tenderness.  No midline bony tenderness.  No CVA tenderness.  Slight reproducible discomfort with lumbar spine rotation and lateral flexion.  Negative seated straight leg raise, able to heel and toe walk without difficulty.  Neurological:     Mental Status: She is alert and oriented to person, place, and time.  Psychiatric:        Mood and Affect: Mood normal.      Assessment & Plan:  Yolanda Jennings is a 52 y.o. female . Bilateral low back pain without sciatica, unspecified chronicity - Plan: POCT urinalysis dipstick, Basic metabolic panel, meloxicam (MOBIC) 7.5 MG tablet, cyclobenzaprine (FLEXERIL) 5 MG tablet, DG Lumbar Spine 2-3 Views  Nocturia - Plan: POCT urinalysis dipstick, Basic metabolic panel Chronic nocturia, no other urinary symptoms.  Check urinalysis, BMP, but unlikely renal cause.  Suspected mechanical low back pain, possible overuse syndrome.  No  specific injury or trauma, but she would like to have imaging which I think is reasonable given few pound weight loss.  Trial of meloxicam for anti-inflammatory, muscle relaxants with Flexeril if needed with potential side effects discussed.  Recheck in 2 weeks if not resolved, sooner if new or worsening symptoms.  Can decide on further imaging or testing if pain is not improving at that time.  3:02 PM Addendum  04/19/23.  She did not collect a urine at the time of her visit.  BMP was reviewed, reassuring creatinine and without specific new urinary symptoms unlikely urinary tract infection.  Decided against UTI testing at this time.  Advised to let us know early next week if any new urinary symptoms or not improving and I am happy to place an order for a lab only visit.  Understanding expressed.  Meds ordered this encounter  Medications   meloxicam (MOBIC) 7.5 MG tablet    Sig: Take 1 tablet (7.5 mg total) by mouth daily.    Dispense:  30 tablet    Refill:  0   cyclobenzaprine (FLEXERIL) 5 MG tablet    Sig: Take 1 tablet (5 mg total) by mouth 3 (three) times daily as needed for muscle spasms (start qhs prn due to sedation).    Dispense:  15 tablet    Refill:  1   Patient Instructions  Back pain appears to be muscular or from your lumbar spine.  That can be an overuse issue, or a strain.  I will check x-ray, labs including urine test and kidney function test but I do not think those are the cause.  See information below on low back pain.  X-ray at the [facility] facility listed below, meloxicam once per day as needed for pain, do not combine with NSAIDs like Advil or Aleve, and muscle relaxant if needed for spasm.  Do not drive or operate machinery while taking that medication.  Follow-up if not improving in the next 2 weeks, sooner if worse or new symptoms.  Take care.    [facility] or xray: Walk in 8:30-4:30 during weekdays, no appointment needed [address]  [place], [place]  [address]  Acute Back Pain, Adult Acute back pain is sudden and usually short-lived. It is often caused by an injury to the muscles and tissues in the back. The injury may result from: A muscle, tendon, or ligament getting overstretched or torn. Ligaments are tissues that connect bones to each other. Lifting something improperly can cause a back strain. Wear and tear (degeneration) of the spinal disks. Spinal disks are circular tissue that provide cushioning between the bones of the spine (vertebrae). Twisting motions, such as while playing sports or doing yard work. A hit to the back. Arthritis. You may have a physical exam, lab tests, and imaging tests to find the cause of your pain. Acute back pain usually goes away with rest and home care. Follow these instructions at home: Managing pain, stiffness, and swelling Take over-the-counter and prescription medicines only as told by your health care provider. Treatment may include medicines for pain and inflammation that are taken by mouth or applied to the skin, or muscle relaxants. Your health care provider may recommend applying ice during the first 24-48 hours after your pain starts. To do this: Put ice in a plastic bag. Place a towel between your skin and the bag. Leave the ice on for 20 minutes, 2-3 times a day. Remove the ice if your skin turns bright red. This is very important. If you cannot feel pain, heat, or cold, you have a greater risk of damage to the area. If directed, apply heat to the affected area as often as told by your health care provider. Use the heat source that your health care provider recommends, such as a moist heat pack or a heating pad. Place a towel between your skin and the heat source. Leave the  heat on for 20-30 minutes. Remove the heat if your skin turns bright red. This is especially important if you are unable to feel pain, heat, or cold. You have a greater risk of getting burned. Activity  Do not stay in bed.  Staying in bed for more than 1-2 days can delay your recovery. Sit up and stand up straight. Avoid leaning forward when you sit or hunching over when you stand. If you work at a desk, sit close to it so you do not need to lean over. Keep your chin tucked in. Keep your neck drawn back, and keep your elbows bent at a 90-degree angle (right angle). Sit high and close to the steering wheel when you drive. Add lower back (lumbar) support to your car seat, if needed. Take short walks on even surfaces as soon as you are able. Try to increase the length of time you walk each day. Do not sit, drive, or stand in one place for more than 30 minutes at a time. Sitting or standing for long periods of time can put stress on your back. Do not drive or use heavy machinery while taking prescription pain medicine. Use proper lifting techniques. When you bend and lift, use positions that put less stress on your back: [place] your knees. Keep the load close to your body. Avoid twisting. Exercise regularly as told by your health care provider. Exercising helps your back heal faster and helps prevent back injuries by keeping muscles strong and flexible. Work with a physical therapist to make a safe exercise program, as recommended by your health care provider. Do any exercises as told by your physical therapist. Lifestyle Maintain a healthy weight. Extra weight puts stress on your back and makes it difficult to have good posture. Avoid activities or situations that make you feel anxious or stressed. Stress and anxiety increase muscle tension and can make back pain worse. Learn ways to manage anxiety and stress, such as through exercise. General instructions Sleep on a firm mattress in a comfortable position. Try lying on your side with your knees slightly bent. If you lie on your back, put a pillow under your knees. Keep your head and neck in a straight line with your spine (neutral position) when using electronic equipment  like smartphones or pads. To do this: Raise your smartphone or pad to look at it instead of bending your head or neck to look down. Put the smartphone or pad at the level of your face while looking at the screen. Follow your treatment plan as told by your health care provider. This may include: Cognitive or behavioral therapy. Acupuncture or massage therapy. Meditation or yoga. Contact a health care provider if: You have pain that is not relieved with rest or medicine. You have increasing pain going down into your legs or buttocks. Your pain does not improve after 2 weeks. You have pain at night. You lose weight without trying. You have a fever or chills. You develop nausea or vomiting. You develop abdominal pain. Get help right away if: You develop new bowel or bladder control problems. You have unusual weakness or numbness in your arms or legs. You feel faint. These symptoms may represent a serious problem that is an emergency. Do not wait to see if the symptoms will go away. Get medical help right away. Call your local emergency services (911 in the [place] Do not drive yourself to the hospital. Summary Acute back pain is sudden and usually short-lived. Use proper lifting  techniques. When you bend and lift, use positions that put less stress on your back. Take over-the-counter and prescription medicines only as told by your health care provider, and apply heat or ice as told. This information is not intended to replace advice given to you by your health care provider. Make sure you discuss any questions you have with your health care provider. Document Revised: 09/16/2020 Document Reviewed: 09/16/2020 Elsevier Patient Education  2024 Elsevier Inc.     Signed,   Meredith Staggers, MD [facility], [facility] 04/17/23 4:21 PM

## 2023-04-17 NOTE — Patient Instructions (Signed)
Back pain appears to be muscular or from your lumbar spine.  That can be an overuse issue, or a strain.  I will check x-ray, labs including urine test and kidney function test but I do not think those are the cause.  See information below on low back pain.  X-ray at the [facility] facility listed below, meloxicam once per day as needed for pain, do not combine with NSAIDs like Advil or Aleve, and muscle relaxant if needed for spasm.  Do not drive or operate machinery while taking that medication.  Follow-up if not improving in the next 2 weeks, sooner if worse or new symptoms.  Take care.    [facility]: Walk in 8:30-4:30 during weekdays, no appointment needed [address]  [place], [place] [address]  Acute Back Pain, Adult Acute back pain is sudden and usually short-lived. It is often caused by an injury to the muscles and tissues in the back. The injury may result from: A muscle, tendon, or ligament getting overstretched or torn. Ligaments are tissues that connect bones to each other. Lifting something improperly can cause a back strain. Wear and tear (degeneration) of the spinal disks. Spinal disks are circular tissue that provide cushioning between the bones of the spine (vertebrae). Twisting motions, such as while playing sports or doing yard work. A hit to the back. Arthritis. You may have a physical exam, lab tests, and imaging tests to find the cause of your pain. Acute back pain usually goes away with rest and home care. Follow these instructions at home: Managing pain, stiffness, and swelling Take over-the-counter and prescription medicines only as told by your health care provider. Treatment may include medicines for pain and inflammation that are taken by mouth or applied to the skin, or muscle relaxants. Your health care provider may recommend applying ice during the first 24-48 hours after your pain starts. To do this: Put ice in a plastic bag. Place a towel between  your skin and the bag. Leave the ice on for 20 minutes, 2-3 times a day. Remove the ice if your skin turns bright red. This is very important. If you cannot feel pain, heat, or cold, you have a greater risk of damage to the area. If directed, apply heat to the affected area as often as told by your health care provider. Use the heat source that your health care provider recommends, such as a moist heat pack or a heating pad. Place a towel between your skin and the heat source. Leave the heat on for 20-30 minutes. Remove the heat if your skin turns bright red. This is especially important if you are unable to feel pain, heat, or cold. You have a greater risk of getting burned. Activity  Do not stay in bed. Staying in bed for more than 1-2 days can delay your recovery. Sit up and stand up straight. Avoid leaning forward when you sit or hunching over when you stand. If you work at a desk, sit close to it so you do not need to lean over. Keep your chin tucked in. Keep your neck drawn back, and keep your elbows bent at a 90-degree angle (right angle). Sit high and close to the steering wheel when you drive. Add lower back (lumbar) support to your car seat, if needed. Take short walks on even surfaces as soon as you are able. Try to increase the length of time you walk each day. Do not sit, drive, or stand  in one place for more than 30 minutes at a time. Sitting or standing for long periods of time can put stress on your back. Do not drive or use heavy machinery while taking prescription pain medicine. Use proper lifting techniques. When you bend and lift, use positions that put less stress on your back: [place] your knees. Keep the load close to your body. Avoid twisting. Exercise regularly as told by your health care provider. Exercising helps your back heal faster and helps prevent back injuries by keeping muscles strong and flexible. Work with a physical therapist to make a safe exercise program, as  recommended by your health care provider. Do any exercises as told by your physical therapist. Lifestyle Maintain a healthy weight. Extra weight puts stress on your back and makes it difficult to have good posture. Avoid activities or situations that make you feel anxious or stressed. Stress and anxiety increase muscle tension and can make back pain worse. Learn ways to manage anxiety and stress, such as through exercise. General instructions Sleep on a firm mattress in a comfortable position. Try lying on your side with your knees slightly bent. If you lie on your back, put a pillow under your knees. Keep your head and neck in a straight line with your spine (neutral position) when using electronic equipment like smartphones or pads. To do this: Raise your smartphone or pad to look at it instead of bending your head or neck to look down. Put the smartphone or pad at the level of your face while looking at the screen. Follow your treatment plan as told by your health care provider. This may include: Cognitive or behavioral therapy. Acupuncture or massage therapy. Meditation or yoga. Contact a health care provider if: You have pain that is not relieved with rest or medicine. You have increasing pain going down into your legs or buttocks. Your pain does not improve after 2 weeks. You have pain at night. You lose weight without trying. You have a fever or chills. You develop nausea or vomiting. You develop abdominal pain. Get help right away if: You develop new bowel or bladder control problems. You have unusual weakness or numbness in your arms or legs. You feel faint. These symptoms may represent a serious problem that is an emergency. Do not wait to see if the symptoms will go away. Get medical help right away. Call your local emergency services (911 in the [place] Do not drive yourself to the hospital. Summary Acute back pain is sudden and usually short-lived. Use proper lifting  techniques. When you bend and lift, use positions that put less stress on your back. Take over-the-counter and prescription medicines only as told by your health care provider, and apply heat or ice as told. This information is not intended to replace advice given to you by your health care provider. Make sure you discuss any questions you have with your health care provider. Document Revised: 09/16/2020 Document Reviewed: 09/16/2020 Elsevier Patient Education  2024 [employer].

## 2023-04-18 LAB — BASIC METABOLIC PANEL
BUN: 12 mg/dL (ref 6–23)
CO2: 25 meq/L (ref 19–32)
Calcium: 9.2 mg/dL (ref 8.4–10.5)
Chloride: 107 meq/L (ref 96–112)
Creatinine, Ser: 1.05 mg/dL (ref 0.40–1.20)
GFR: 60.77 mL/min (ref >60.00)
Glucose, Bld: 100 mg/dL — ABNORMAL HIGH (ref 70–99)
Potassium: 3.6 meq/L (ref 3.5–5.1)
Sodium: 138 meq/L (ref 135–145)

## 2023-05-09 LAB — HM MAMMOGRAPHY

## 2023-05-15 ENCOUNTER — Telehealth: Payer: Self-pay | Admitting: Family Medicine

## 2023-05-15 NOTE — Telephone Encounter (Signed)
Order for Diagnostic Mammogram  Charge sheet attached and placed in front bin

## 2023-05-15 NOTE — Telephone Encounter (Signed)
Placed in your sign folder  

## 2023-05-16 LAB — IMAGING RESULTS - SCANNED

## 2023-05-17 ENCOUNTER — Encounter: Payer: Self-pay | Admitting: Family Medicine

## 2023-05-17 NOTE — Telephone Encounter (Signed)
Paperwork completed and placed in fax bin at back nurse station

## 2023-05-27 ENCOUNTER — Telehealth: Payer: Self-pay

## 2023-05-27 NOTE — Telephone Encounter (Signed)
Order received via fax for mammogram this had already been responded to once but this was signed and sent back again

## 2023-05-31 ENCOUNTER — Ambulatory Visit: Payer: 59 | Admitting: Family Medicine

## 2023-05-31 ENCOUNTER — Encounter: Payer: Self-pay | Admitting: Family Medicine

## 2023-05-31 VITALS — BP 124/70 | HR 71 | Temp 97.6°F | Ht 64.0 in | Wt 165.0 lb

## 2023-05-31 DIAGNOSIS — M545 Low back pain, unspecified: Secondary | ICD-10-CM | POA: Diagnosis not present

## 2023-05-31 DIAGNOSIS — R634 Abnormal weight loss: Secondary | ICD-10-CM

## 2023-05-31 LAB — COMPREHENSIVE METABOLIC PANEL
ALT: 13 U/L (ref 0–35)
AST: 18 U/L (ref 0–37)
Albumin: 4.1 g/dL (ref 3.5–5.2)
Alkaline Phosphatase: 77 U/L (ref 39–117)
BUN: 11 mg/dL (ref 6–23)
CO2: 25 meq/L (ref 19–32)
Calcium: 9.3 mg/dL (ref 8.4–10.5)
Chloride: 105 meq/L (ref 96–112)
Creatinine, Ser: 1.13 mg/dL (ref 0.40–1.20)
GFR: 55.6 mL/min — ABNORMAL LOW (ref >60.00)
Glucose, Bld: 85 mg/dL (ref 70–99)
Potassium: 4.2 meq/L (ref 3.5–5.1)
Sodium: 137 meq/L (ref 135–145)
Total Bilirubin: 0.5 mg/dL (ref 0.2–1.2)
Total Protein: 7.8 g/dL (ref 6.0–8.3)

## 2023-05-31 LAB — CBC
HCT: 37.2 % (ref 36.0–46.0)
Hemoglobin: 12 g/dL (ref 12.0–15.0)
MCHC: 32.2 g/dL (ref 30.0–36.0)
MCV: 82.3 fL (ref 78.0–100.0)
Platelets: 288 10*3/uL (ref 150.0–400.0)
RBC: 4.52 Mil/uL (ref 3.87–5.11)
RDW: 14.4 % (ref 11.5–15.5)
WBC: 4.7 10*3/uL (ref 4.0–10.5)

## 2023-05-31 LAB — TSH: TSH: 0.91 u[IU]/mL (ref 0.35–5.50)

## 2023-05-31 MED ORDER — MELOXICAM 7.5 MG PO TABS
7.5000 mg | ORAL_TABLET | Freq: Every day | ORAL | 0 refills | Status: DC
Start: 2023-05-31 — End: 2023-09-19

## 2023-05-31 MED ORDER — CYCLOBENZAPRINE HCL 5 MG PO TABS
5.0000 mg | ORAL_TABLET | Freq: Three times a day (TID) | ORAL | 1 refills | Status: DC | PRN
Start: 2023-05-31 — End: 2023-09-19

## 2023-05-31 NOTE — Patient Instructions (Signed)
Sorry to hear that your back has still been bothersome.  I will refer you to physical therapy, okay to continue the meloxicam for now, muscle relaxant if needed.  Please have x-ray performed at the [facility] location below.  That order was entered last visit.  They should not need another one.  Let me know if any issues with having that x-ray performed.  I will check some other labs with the change in weight from last year but that could be related to dietary changes.  If any concerns I will let you know.  Recheck in 1 month, sooner if any new or worsening symptoms.  [facility] or xray: Walk in 8:30-4:30 during weekdays, no appointment needed [address]  [place], [place] [address]

## 2023-05-31 NOTE — Progress Notes (Signed)
Subjective:  Patient ID: Yolanda Jennings, female    DOB: February 10, 1971  Age: 52 y.o. MRN: [id number]  CC:  Chief Complaint  Patient presents with   Back Pain    Pt notes back pain going for about 3 weeks notes meds prescribed 2 weeks ago are not working. Notes it can be sharp at times    Weight Loss    Pt notes having issues with weight loss, weight is the same but feels her mass has changed     HPI Yolanda Jennings presents for   Low back pain Follow-up from October 9 visit.Back pain for 2 weeks at that time without new exercise fall or new activity.  No new urinary symptoms.  Nocturia but no hematuria, frequency urgency or dysuria.  Pain to the upper thigh but otherwise no leg radiation.  She does engage in physical work but no change in hours or type of work.  Minimal improvement with heat, Tylenol at that visit.  Lumbar spine x-ray was ordered but not completed.  Flexeril, meloxicam ordered. Took mobic once per day, muscle relaxer every other day at night to help with sleep - some help. No cahnge in pain during day. Left sided, no leg radiation.  No bowel or bladder incontinence, no saddle anesthesia, no lower extremity weakness. No unexplained night sweats or weight loss. No fever.  Pain not limiting activity. Did not know xray was ordered.   Weight concern: Feels like losing weight - shape has changed. 5 pounds down from past month on home scale. Clothes fitting looser. Weight increased 2 pounds from August, 4 pounds from October.  On chart review her weight was 179 in August 2023.  178 in October 2022. Did cut out red meat intake over past year. Less sugar. No changes in exercise - active job - physical job.  Colonoscopy 2021 - advised to repeat in 10 years.  Mammogram 05/09/23.  Normal pap in 2023 - neg for HR HPV.  No n/v/abd pain, eating and drinking normally.  No hematuria - urinating normally.  Wt Readings from Last 3 Encounters:  05/31/23 165 lb (74.8 kg)  04/17/23 161  lb (73 kg)  02/11/23 163 lb 12.8 oz (74.3 kg)   Body mass index is 28.32 kg/m.  History Patient Active Problem List   Diagnosis Date Noted   Menorrhagia with irregular cycle 09/02/2021   Intramural uterine fibroid 09/02/2021   Iron deficiency anemia due to chronic blood loss 09/02/2021   Cervical myofascial pain syndrome 07/20/2020   Obesity (BMI 30.0-34.9) 08/22/2014   Microcytic anemia 09/03/2013   Headache 09/03/2013   HTN (hypertension) 08/28/2013   Past Medical History:  Diagnosis Date   Allergy    Hypertension    Sickle cell trait (HCC)    Tobacco abuse    Stopped smoking 08/24/13   Past Surgical History:  Procedure Laterality Date   BREAST BIOPSY Left 2011   HERNIA REPAIR  2013   TUBAL LIGATION     2003   Allergies  Allergen Reactions   Ace Inhibitors Swelling and Other (See Comments)    Reaction:  Tongue swelling    Lisinopril Swelling and Other (See Comments)    Reaction:  Tongue swelling    Prior to Admission medications   Medication Sig Start Date End Date Taking? Authorizing Provider  amLODipine (NORVASC) 10 MG tablet Take 1 tablet (10 mg total) by mouth daily. 08/13/22  Yes Shade Flood, MD  blood glucose meter kit and supplies  Dispense based on patient and insurance preference. Use once daily as directed. 02/09/22  Yes Shade Flood, MD  carvedilol (COREG) 25 MG tablet Take 1 tablet (25 mg total) by mouth 2 (two) times daily with a meal. 08/13/22  Yes Shade Flood, MD  cloNIDine (CATAPRES) 0.1 MG tablet Take 1 tablet (0.1 mg total) by mouth 2 (two) times daily. 08/13/22  Yes Shade Flood, MD  cyclobenzaprine (FLEXERIL) 5 MG tablet Take 1 tablet (5 mg total) by mouth 3 (three) times daily as needed for muscle spasms (start qhs prn due to sedation). 04/17/23  Yes Shade Flood, MD  meloxicam (MOBIC) 7.5 MG tablet Take 1 tablet (7.5 mg total) by mouth daily. 04/17/23  Yes Shade Flood, MD  potassium chloride SA (KLOR-CON) 20 MEQ tablet Take  2 tablets (40 mEq total) by mouth daily. 12/07/20  Yes Shade Flood, MD   Social History   Socioeconomic History   Marital status: Single    Spouse name: Not on file   Number of children: Not on file   Years of education: Not on file   Highest education level: Not on file  Occupational History   Occupation: Estate agent  Tobacco Use   Smoking status: Former    Current packs/day: 0.00    Types: Cigarettes    Start date: 08/24/1993    Quit date: 08/24/2013    Years since quitting: 9.7   Smokeless tobacco: Never  Vaping Use   Vaping status: Never Used  Substance and Sexual Activity   Alcohol use: Yes    Alcohol/week: 0.0 standard drinks of alcohol    Comment: occasional   Drug use: No   Sexual activity: Yes    Birth control/protection: Condom  Other Topics Concern   Not on file  Social History Narrative   Marital status: Single      Children: 2 children; no grandchildren.      Lives: with one child      Employment: Estate agent      Tobacco: quit 08/2013.       Alcohol:  Socially.      Exercise: Yes      Education: [employer].      Right handed   Caffeine: coffee in the winter, 1-2 cups/day   Social Determinants of Health   Financial Resource Strain: Not on file  Food Insecurity: Not on file  Transportation Needs: Not on file  Physical Activity: Not on file  Stress: Not on file  Social Connections: Not on file  Intimate Partner Violence: Not on file    Review of Systems Per HPI.   Objective:   Vitals:   05/31/23 1101  BP: 124/70  Pulse: 71  Temp: 97.6 F (36.4 C)  TempSrc: Temporal  SpO2: 100%  Weight: 165 lb (74.8 kg)  Height: 5\' 4"  (1.626 m)     Physical Exam Vitals reviewed.  Constitutional:      General: She is not in acute distress.    Appearance: Normal appearance. She is well-developed.  HENT:     Head: Normocephalic and atraumatic.  Cardiovascular:     Rate and Rhythm: Normal rate.  Pulmonary:     Effort: Pulmonary  effort is normal.  Musculoskeletal:     Comments: Lumbar spine, no midline bony tenderness, area of discomfort at lower paraspinals bilaterally.  Negative seated straight leg raise, able to heel and toe walk without difficulty, full range of motion.  Ambulating without assistive device.  Neurological:  Mental Status: She is alert and oriented to person, place, and time.  Psychiatric:        Mood and Affect: Mood normal.        Assessment & Plan:  Athyna Lovato is a 52 y.o. female . Bilateral low back pain without sciatica, unspecified chronicity - Plan: Ambulatory referral to Physical Therapy, meloxicam (MOBIC) 7.5 MG tablet, cyclobenzaprine (FLEXERIL) 5 MG tablet  Weight loss - Plan: CBC, Comprehensive metabolic panel, TSH  Suspected mechanical low back pain.  Minimal changes since last visit.  Unfortunately has not had imaging yet, information provided for x-ray again.  Will continue meloxicam, Flexeril temporarily for now and refer to physical therapy.  If any concerns on imaging can refer to orthopedics or consider advanced imaging.  Recheck in 1 month, sooner if new or worsening symptoms.  Weight concerns discussed as above with change in weight since last year.  Potentially could be related to her dietary changes, active/physical job but will check labs as above.  Up-to-date on routine cancer screening as above.  1 month follow-up.  Meds ordered this encounter  Medications   meloxicam (MOBIC) 7.5 MG tablet    Sig: Take 1 tablet (7.5 mg total) by mouth daily.    Dispense:  30 tablet    Refill:  0   cyclobenzaprine (FLEXERIL) 5 MG tablet    Sig: Take 1 tablet (5 mg total) by mouth 3 (three) times daily as needed for muscle spasms (start qhs prn due to sedation).    Dispense:  15 tablet    Refill:  1   Patient Instructions  Sorry to hear that your back has still been bothersome.  I will refer you to physical therapy, okay to continue the meloxicam for now, muscle  relaxant if needed.  Please have x-ray performed at the [facility] location below.  That order was entered last visit.  They should not need another one.  Let me know if any issues with having that x-ray performed.  I will check some other labs with the change in weight from last year but that could be related to dietary changes.  If any concerns I will let you know.  Recheck in 1 month, sooner if any new or worsening symptoms.  [facility] or xray: Walk in 8:30-4:30 during weekdays, no appointment needed [address]  [place], [place] [address]     Signed,   Meredith Staggers, MD [facility], [facility] 05/31/23 11:29 AM

## 2023-07-01 ENCOUNTER — Ambulatory Visit: Payer: 59 | Admitting: Family Medicine

## 2023-07-01 ENCOUNTER — Ambulatory Visit (INDEPENDENT_AMBULATORY_CARE_PROVIDER_SITE_OTHER)
Admission: RE | Admit: 2023-07-01 | Discharge: 2023-07-01 | Disposition: A | Discharge status: Home / Self Care | Payer: 59 | Source: Ambulatory Visit | Attending: Family Medicine | Admitting: Family Medicine

## 2023-07-01 ENCOUNTER — Encounter: Payer: Self-pay | Admitting: Family Medicine

## 2023-07-01 VITALS — BP 130/72 | HR 78 | Temp 98.9°F | Ht 64.0 in | Wt 168.6 lb

## 2023-07-01 DIAGNOSIS — M545 Low back pain, unspecified: Secondary | ICD-10-CM

## 2023-07-01 DIAGNOSIS — R634 Abnormal weight loss: Secondary | ICD-10-CM | POA: Diagnosis not present

## 2023-07-01 NOTE — Patient Instructions (Addendum)
No change in plan for back pain for now.  Please try to have the x-ray performed and if any concerns on x-ray I will let you know.  I do recommend following up with physical therapy because they likely will recommend other treatments besides the needling and I would let them know your preference is not to proceed with that treatment again. Ok to use meloxicam as needed for now.   Keep me posted but if not significantly improving with physical therapy next 6 weeks I would recommend meeting with orthopedics.  I am happy to refer you sooner if any new or worsening symptoms.  Weight is stable and actually increased a few pounds today.  If any weight loss at home or new symptoms let me know right away.  Previous labs were overall reassuring.  Follow-up in 3 months.  [facility] or xray: Walk in 8:30-4:30 during weekdays, no appointment needed [address]  [place], [place] [address]

## 2023-07-01 NOTE — Progress Notes (Signed)
Subjective:  Patient ID: Yolanda Jennings, female    DOB: 1970/10/03  Age: 52 y.o. MRN: [id number]  CC:  Chief Complaint  Patient presents with   Medical Management of Chronic Issues    Pt notes has not had Xray done due to work schedule will attempt to go today after appt, pt notes back still hurts, intermitent   Results    Discuss labs     HPI Wilba Gieck presents for   Low back pain Initially discussed in October.  Physical work but no apparent change in hours or Work at that time.  Initially treated with Flexeril and meloxicam, x-ray was ordered.  Has not yet been completed.  Suspected mechanical low back pain and minimal changes from her October to November visit.  Still has not had x-ray performed.  Plans to have performed after today's visit.  I did refill her Flexeril temporarily, meloxicam and referred to physical therapy in November.  Depending on imaging option of Ortho or advanced imaging.   Since last visit, still having some low back pain, intermittent. About the same as last visit. More left, to center back. No leg radiation recently.  Intermittent dosing of meloxicam - 1-2 times per week. Flexeril - minimal change - makes sleepy. Not taking typically - rare use.  No bowel or bladder incontinence, no saddle anesthesia, no lower extremity weakness. Menopausal night sweats. No fevers. Did not want to follow up with PT - concerned they will do dry needling.  Some relief with topical lidocaine patch.    Weight concerns Discussed at November visit.  She had reported 5 pound weight loss over the previous month on her home scale. Weight 179 in August 2023, 178 in October 2022.  She had made dietary changes with cutting out red meat over the past year, less sugar.  No change in exercise but she does have a physical/active job.  Weight was 163 in August, 161 in October, 165 last visit.  168 today.  TSH and CBC were normal from last visit.CMP overall reassuring with  creatinine 1.13, minimal change from 1.07, 1.05 previously, with EGFR 55.  Up-to-date with routine cancer screenings with colonoscopy in 2021 with planned repeat in 10 years, mammogram in October and normal Pap in 2023 which was negative for high risk HPV.  She denies any nausea, vomiting, abdominal pain or new urinary symptoms at that time. Decreased pant/belt size- not increased or any bloating.  No cough/dyspnea.  Wt Readings from Last 3 Encounters:  07/01/23 168 lb 9.6 oz (76.5 kg)  05/31/23 165 lb (74.8 kg)  04/17/23 161 lb (73 kg)     History Patient Active Problem List   Diagnosis Date Noted   Menorrhagia with irregular cycle 09/02/2021   Intramural uterine fibroid 09/02/2021   Iron deficiency anemia due to chronic blood loss 09/02/2021   Cervical myofascial pain syndrome 07/20/2020   Obesity (BMI 30.0-34.9) 08/22/2014   Microcytic anemia 09/03/2013   Headache 09/03/2013   HTN (hypertension) 08/28/2013   Past Medical History:  Diagnosis Date   Allergy    Hypertension    Sickle cell trait (HCC)    Tobacco abuse    Stopped smoking 08/24/13   Past Surgical History:  Procedure Laterality Date   BREAST BIOPSY Left 2011   HERNIA REPAIR  2013   TUBAL LIGATION     2003   Allergies  Allergen Reactions   Ace Inhibitors Swelling and Other (See Comments)    Reaction:  Tongue  swelling    Lisinopril Swelling and Other (See Comments)    Reaction:  Tongue swelling    Prior to Admission medications   Medication Sig Start Date End Date Taking? Authorizing Provider  amLODipine (NORVASC) 10 MG tablet Take 1 tablet (10 mg total) by mouth daily. 08/13/22   Shade Flood, MD  blood glucose meter kit and supplies Dispense based on patient and insurance preference. Use once daily as directed. 02/09/22   Shade Flood, MD  carvedilol (COREG) 25 MG tablet Take 1 tablet (25 mg total) by mouth 2 (two) times daily with a meal. 08/13/22   Shade Flood, MD  cloNIDine (CATAPRES) 0.1 MG  tablet Take 1 tablet (0.1 mg total) by mouth 2 (two) times daily. 08/13/22   Shade Flood, MD  cyclobenzaprine (FLEXERIL) 5 MG tablet Take 1 tablet (5 mg total) by mouth 3 (three) times daily as needed for muscle spasms (start qhs prn due to sedation). 05/31/23   Shade Flood, MD  meloxicam (MOBIC) 7.5 MG tablet Take 1 tablet (7.5 mg total) by mouth daily. 05/31/23   Shade Flood, MD  potassium chloride SA (KLOR-CON) 20 MEQ tablet Take 2 tablets (40 mEq total) by mouth daily. 12/07/20   Shade Flood, MD   Social History   Socioeconomic History   Marital status: Single    Spouse name: Not on file   Number of children: Not on file   Years of education: Not on file   Highest education level: Not on file  Occupational History   Occupation: Estate agent  Tobacco Use   Smoking status: Former    Current packs/day: 0.00    Types: Cigarettes    Start date: 08/24/1993    Quit date: 08/24/2013    Years since quitting: 9.8   Smokeless tobacco: Never  Vaping Use   Vaping status: Never Used  Substance and Sexual Activity   Alcohol use: Yes    Alcohol/week: 0.0 standard drinks of alcohol    Comment: occasional   Drug use: No   Sexual activity: Yes    Birth control/protection: Condom  Other Topics Concern   Not on file  Social History Narrative   Marital status: Single      Children: 2 children; no grandchildren.      Lives: with one child      Employment: Estate agent      Tobacco: quit 08/2013.       Alcohol:  Socially.      Exercise: Yes      Education: [employer].      Right handed   Caffeine: coffee in the winter, 1-2 cups/day   Social Drivers of Corporate investment banker Strain: Not on file  Food Insecurity: Not on file  Transportation Needs: Not on file  Physical Activity: Not on file  Stress: Not on file  Social Connections: Not on file  Intimate Partner Violence: Not on file    Review of Systems   Objective:   Vitals:   07/01/23 1506   BP: 130/72  Pulse: 78  Temp: 98.9 F (37.2 C)  TempSrc: Temporal  SpO2: 100%  Weight: 168 lb 9.6 oz (76.5 kg)  Height: 5\' 4"  (1.626 m)     Physical Exam Vitals reviewed.  Constitutional:      General: She is not in acute distress.    Appearance: Normal appearance. She is well-developed.  HENT:     Head: Normocephalic and atraumatic.  Cardiovascular:  Rate and Rhythm: Normal rate.  Pulmonary:     Effort: Pulmonary effort is normal.  Abdominal:     General: There is no distension.     Palpations: There is no mass.     Tenderness: There is no abdominal tenderness. There is no guarding.  Musculoskeletal:     Comments: Lumbar spine, minimal discomfort of paraspinals lower lumbar spine without focal or midline bony tenderness.  Negative seated straight leg raise, able to heel and toe walk without difficulty, ambulating without assistive device.  Neurological:     Mental Status: She is alert and oriented to person, place, and time.  Psychiatric:        Mood and Affect: Mood normal.        Assessment & Plan:  Erlinda Sarley is a 52 y.o. female . Bilateral low back pain without sciatica, unspecified chronicity  -Overall stable, suspected mechanical low back pain.  Imaging planned, plans to have that performed today after visit.  Okay to continue meloxicam intermittently, can stop Flexeril if no benefit.  Topical treatments such as lidocaine patch are fine for now.  I did recommend follow-up with physical therapy and to let them know if her preference is to not do dry needling but that other treatments likely will be beneficial.  If not significant improved with 6 weeks of PT, would recommend advanced imaging or orthopedic evaluation.  RTC precautions given.  Sooner if worse.  Weight loss  -Reassuring labs, history as above.  Weight has actually increased a few pounds.  Previous weight loss may have been due to dietary changes.  Continue to monitor with RTC precautions if  any new symptoms or weight decreases again. 62-month recheck.  No orders of the defined types were placed in this encounter.  Patient Instructions  No change in plan for back pain for now.  Please try to have the x-ray performed and if any concerns on x-ray I will let you know.  I do recommend following up with physical therapy because they likely will recommend other treatments besides the needling and I would let them know your preference is not to proceed with that treatment again. Ok to use meloxicam as needed for now.   Keep me posted but if not significantly improving with physical therapy next 6 weeks I would recommend meeting with orthopedics.  I am happy to refer you sooner if any new or worsening symptoms.  Weight is stable and actually increased a few pounds today.  If any weight loss at home or new symptoms let me know right away.  Previous labs were overall reassuring.  Follow-up in 3 months.  [facility] or xray: Walk in 8:30-4:30 during weekdays, no appointment needed [address]  [place], [place] [address]       Signed,   Meredith Staggers, MD [facility], [facility] 07/01/23 3:38 PM

## 2023-08-15 ENCOUNTER — Encounter: Payer: 59 | Admitting: Family Medicine

## 2023-08-15 ENCOUNTER — Telehealth: Payer: Self-pay

## 2023-08-15 NOTE — Telephone Encounter (Signed)
 Patient called to cancel and reason states "feeling under the weather" I called patient to check on her and be sure she didn't need an appt or at least a video visit to address why she isn't feeling well. I left her a vm to call me back

## 2023-08-19 NOTE — Telephone Encounter (Signed)
 Pt was called and I've scheduled appt 09/19/2023

## 2023-08-31 ENCOUNTER — Other Ambulatory Visit: Payer: Self-pay | Admitting: Family Medicine

## 2023-08-31 DIAGNOSIS — I10 Essential (primary) hypertension: Secondary | ICD-10-CM

## 2023-09-03 IMAGING — CR DG CHEST 2V
2 series · 2 of 2 positions shown · non-contrast
Comparison: Radiograph and chest CT June 2017

CLINICAL DATA: Palpitations.  Shortness of breath.

EXAM:
CHEST - 2 VIEW

[chest pa]
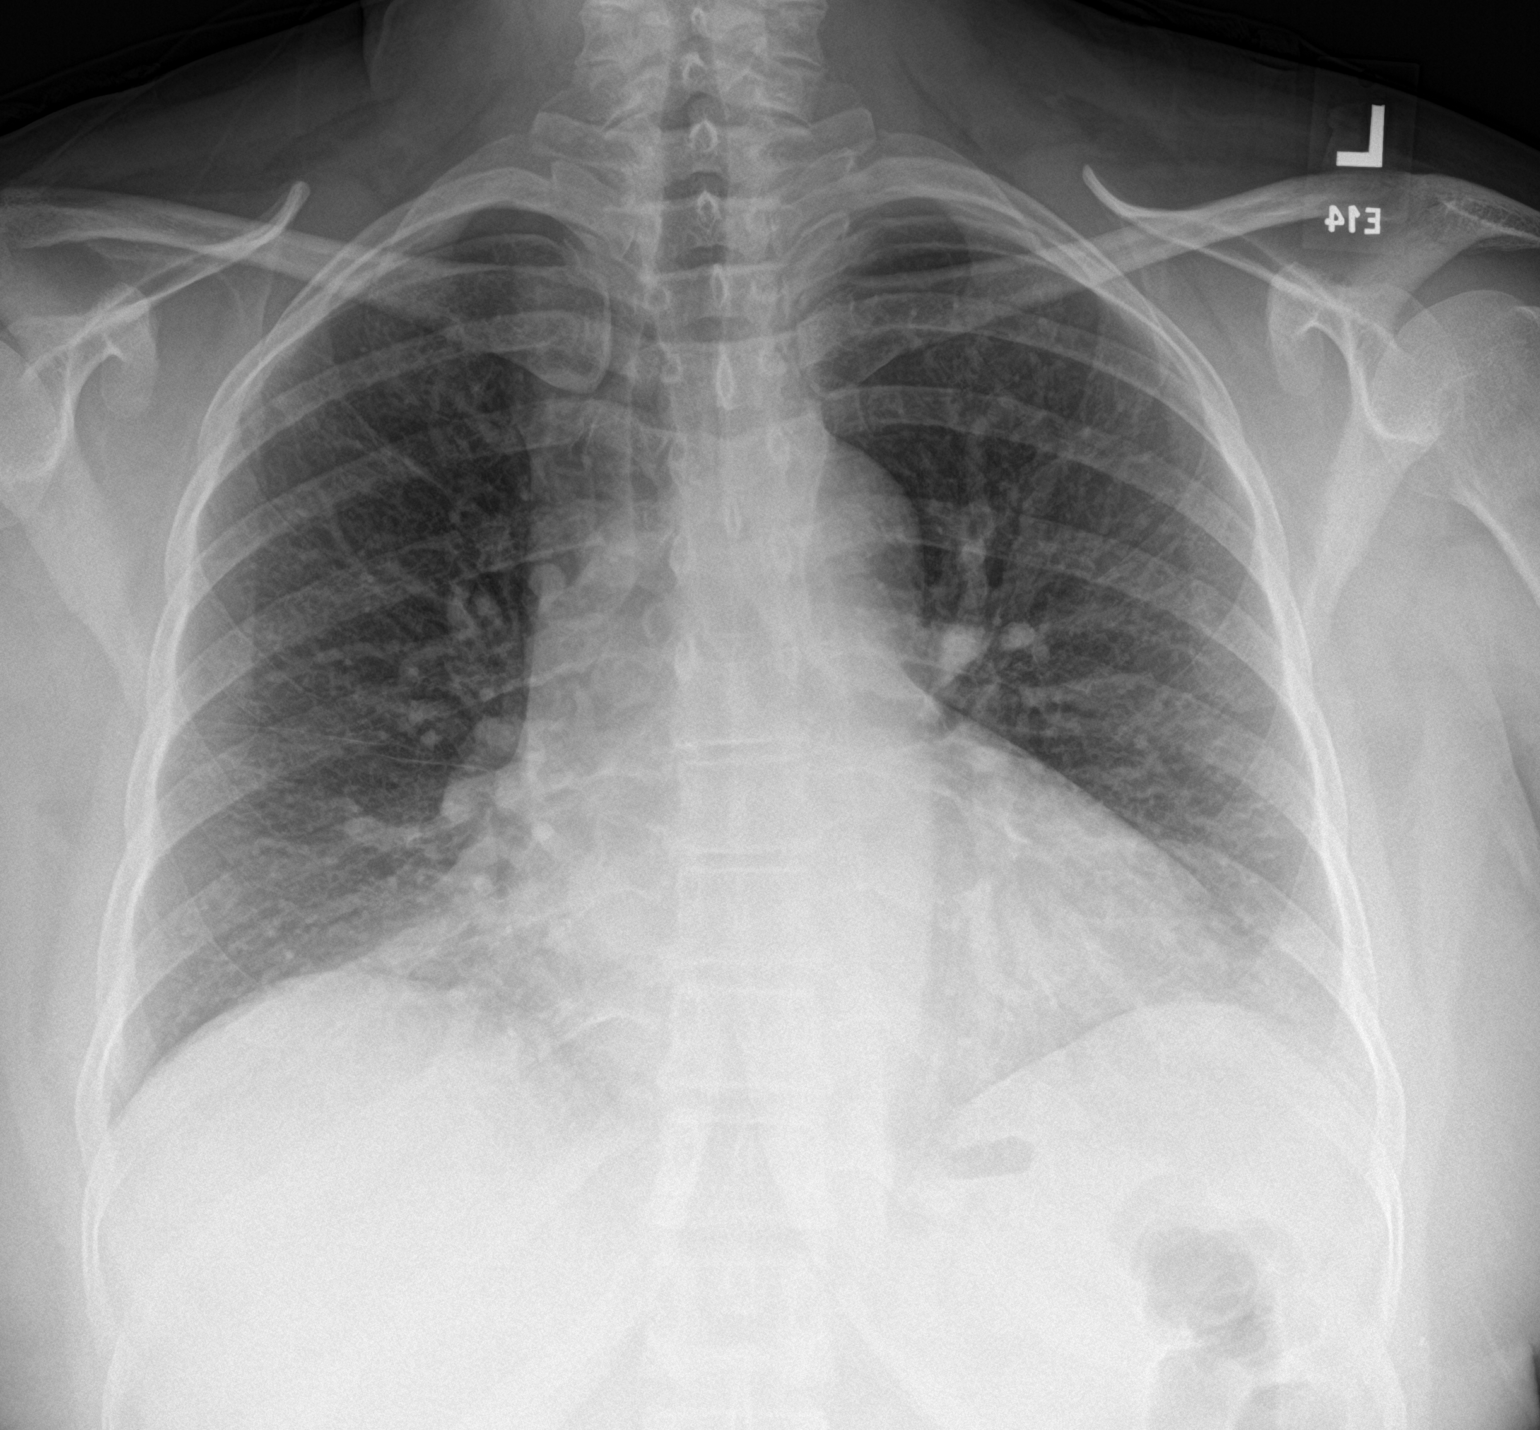

[chest lat]
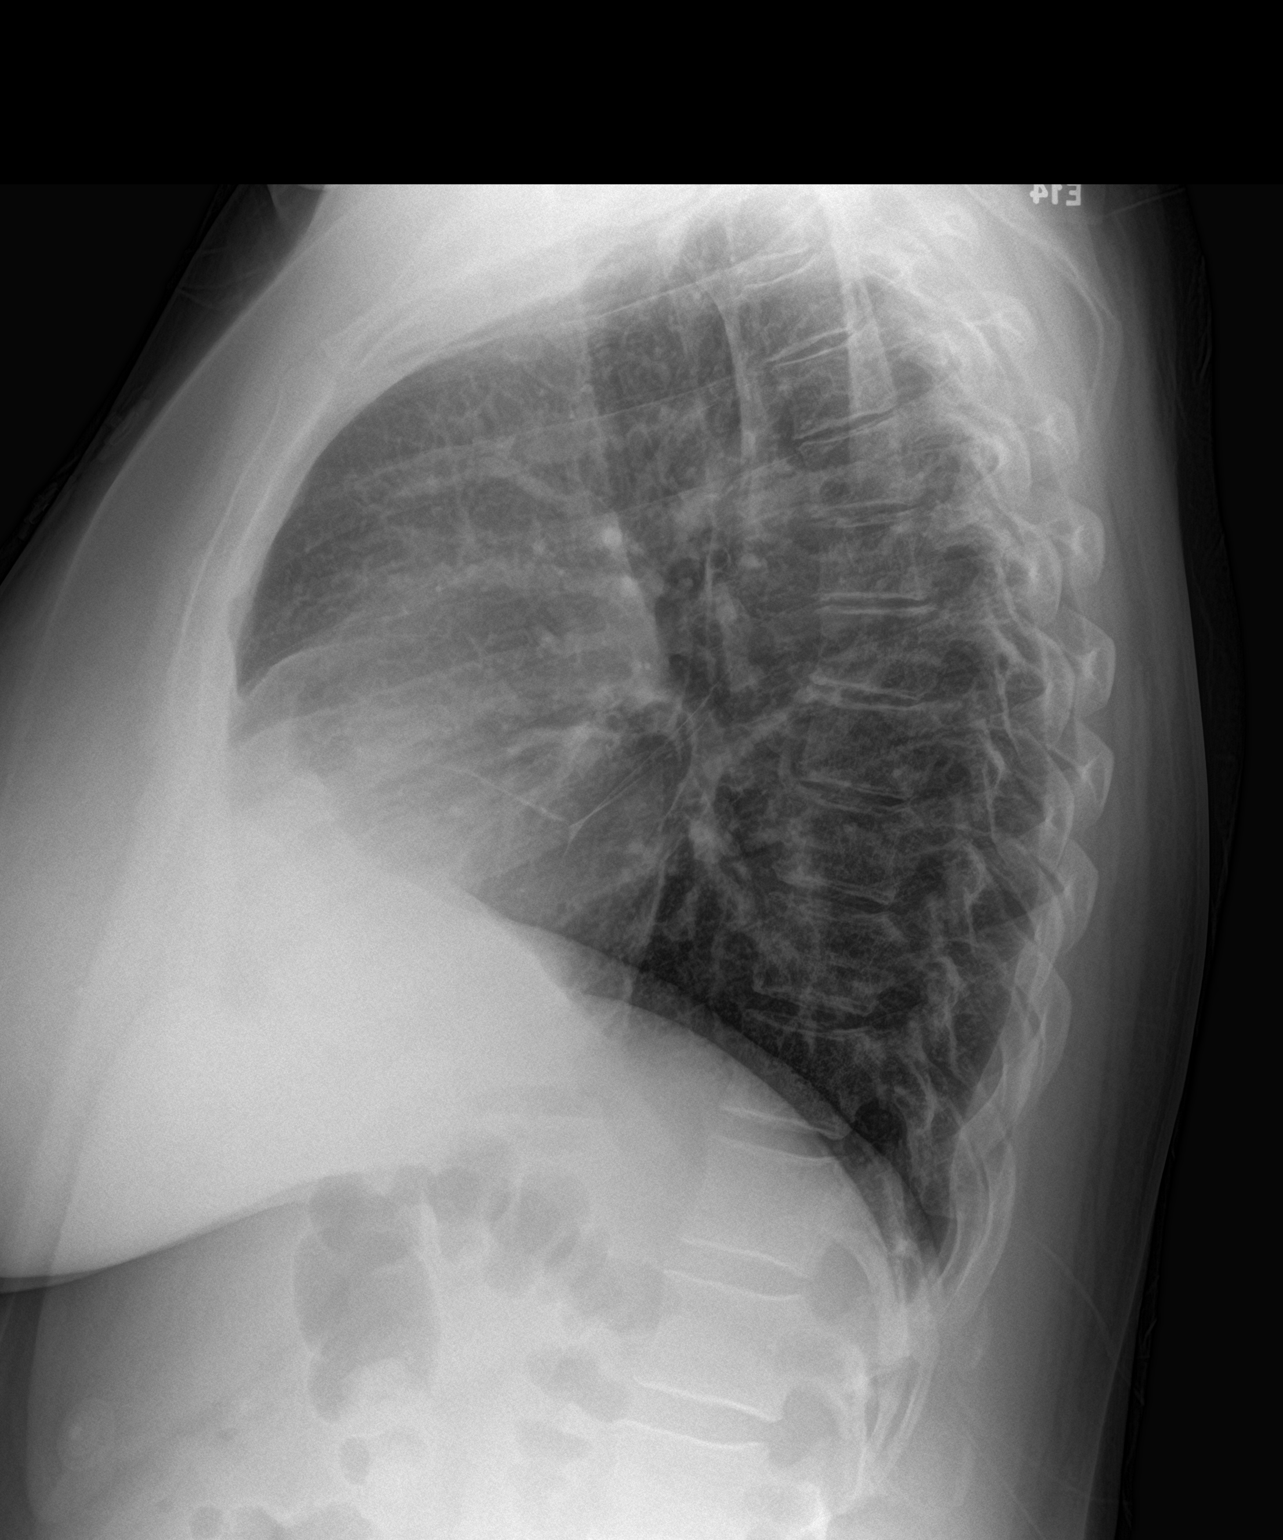

[2 of 2 positions shown; findings below may reference images not displayed]

FINDINGS: Lower lung volumes from prior exam.The cardiomediastinal contours
are unchanged allowing for differences in technique. Mild
peribronchial thickening. Pulmonary vasculature is normal. No
consolidation, pleural effusion, or pneumothorax. No acute osseous
abnormalities are seen.
IMPRESSION: Mild peribronchial thickening.

## 2023-09-06 ENCOUNTER — Other Ambulatory Visit: Payer: Self-pay | Admitting: Family Medicine

## 2023-09-06 DIAGNOSIS — I10 Essential (primary) hypertension: Secondary | ICD-10-CM

## 2023-09-19 ENCOUNTER — Ambulatory Visit (INDEPENDENT_AMBULATORY_CARE_PROVIDER_SITE_OTHER): Payer: 59 | Admitting: Family Medicine

## 2023-09-19 ENCOUNTER — Encounter: Payer: Self-pay | Admitting: Family Medicine

## 2023-09-19 VITALS — BP 118/66 | HR 75 | Temp 98.9°F | Ht 64.75 in | Wt 162.4 lb

## 2023-09-19 DIAGNOSIS — Z0000 Encounter for general adult medical examination without abnormal findings: Secondary | ICD-10-CM

## 2023-09-19 DIAGNOSIS — E876 Hypokalemia: Secondary | ICD-10-CM

## 2023-09-19 DIAGNOSIS — M545 Low back pain, unspecified: Secondary | ICD-10-CM | POA: Diagnosis not present

## 2023-09-19 DIAGNOSIS — Z1329 Encounter for screening for other suspected endocrine disorder: Secondary | ICD-10-CM

## 2023-09-19 DIAGNOSIS — I10 Essential (primary) hypertension: Secondary | ICD-10-CM | POA: Diagnosis not present

## 2023-09-19 DIAGNOSIS — Z13220 Encounter for screening for lipoid disorders: Secondary | ICD-10-CM | POA: Diagnosis not present

## 2023-09-19 DIAGNOSIS — R7303 Prediabetes: Secondary | ICD-10-CM

## 2023-09-19 DIAGNOSIS — Z130 Encounter for screening for diseases of the blood and blood-forming organs and certain disorders involving the immune mechanism: Secondary | ICD-10-CM

## 2023-09-19 MED ORDER — CLONIDINE HCL 0.1 MG PO TABS: 0.1000 mg | ORAL_TABLET | Freq: Two times a day (BID) | ORAL | 3 refills | Status: AC

## 2023-09-19 MED ORDER — AMLODIPINE BESYLATE 10 MG PO TABS: 10.0000 mg | ORAL_TABLET | Freq: Every day | ORAL | 3 refills | Status: AC

## 2023-09-19 MED ORDER — MELOXICAM 7.5 MG PO TABS: 7.5000 mg | ORAL_TABLET | Freq: Every day | ORAL | 0 refills | Status: AC | PRN

## 2023-09-19 MED ORDER — CARVEDILOL 25 MG PO TABS: 25.0000 mg | ORAL_TABLET | Freq: Two times a day (BID) | ORAL | 3 refills | Status: AC

## 2023-09-19 MED ORDER — POTASSIUM CHLORIDE CRYS ER 20 MEQ PO TBCR: EXTENDED_RELEASE_TABLET | ORAL | 5 refills | Status: AC

## 2023-09-19 NOTE — Progress Notes (Signed)
 Subjective:  Patient ID: Yolanda Jennings, female    DOB: [birth year]  Age: 53 y.o. MRN: [id number]  CC:  Chief Complaint  Patient presents with   Annual Exam    Pt is doing well no questions, pt is fasting    Health Maintenance    Declined Shingles vaccine, HIV and Hep C screening, and did not have COVID -19 vaccine in 2024     HPI Mohogany Jennings presents for Annual Exam  PCP, [facility] Gyne[facility], Dr. Cathleen Fears grandbaby 5 months ago - Alani Jade  Bilateral low back pain Treated in December.  Stable at that time, suspected mechanical low back pain.  Meloxicam intermittently with commended follow-up with physical therapy.  There was a concern of having to be dry needling but recommended she discuss that preference with her therapist.  Option of Ortho eval or advanced imaging if not improving with PT. Decided against return to PT. Took meloxicam - pain improved. Still intermittent use - once per week. No hx of PUD/CAD. CKD known.  Lumbar XR 07/01/23 - IMPRESSION: No acute osseous findings or significant spondylosis. Mild straightening of the lumbar lordosis.   Lab Results  Component Value Date   CREATININE 1.13 05/31/2023   Concerns regarding weight discussed previously but weight did increase a few pounds in December.  Reassuring labs previously. Still avoiding red meat.  No night sweats, or new bone pain.   Wt Readings from Last 3 Encounters:  09/19/23 162 lb 6.4 oz (73.7 kg)  07/01/23 168 lb 9.6 oz (76.5 kg)  05/31/23 165 lb (74.8 kg)   Hypertension: Amlodipine 10 mg daily, carvedilol 25 mg twice daily, clonidine 0.1 mg twice daily.   Taking 2 potassium 2 times per week.. Home readings: none recent. Few weeks ago 132/76.  BP Readings from Last 3 Encounters:  09/19/23 118/66  07/01/23 130/72  05/31/23 124/70   Lab Results  Component Value Date   CREATININE 1.13 05/31/2023   Prediabetes: Dietary changes with some weight loss previously, healthier diet with  more vegetables and avoidance of red meat.  Just outside of prediabetes range in August. Lab Results  Component Value Date   HGBA1C 5.6 02/11/2023   Wt Readings from Last 3 Encounters:  09/19/23 162 lb 6.4 oz (73.7 kg)  07/01/23 168 lb 9.6 oz (76.5 kg)  05/31/23 165 lb (74.8 kg)   Lab Results  Component Value Date   CHOL 191 02/11/2023   HDL 75.20 02/11/2023   LDLCALC 88 02/11/2023   TRIG 137.0 02/11/2023   CHOLHDL 3 02/11/2023       05/31/2023   10:57 AM 02/11/2023    1:28 PM 08/13/2022    1:08 PM 02/07/2022    1:43 PM 08/30/2021    1:39 PM  Depression screen PHQ 2/9  Decreased Interest 0 1 0 0 0  Down, Depressed, Hopeless 0 0 0 0 0  PHQ - 2 Score 0 1 0 0 0  Altered sleeping 0 1  1   Tired, decreased energy 0 0  0   Change in appetite 0 0  0   Feeling bad or failure about yourself  0 0  0   Trouble concentrating 0 0  0   Moving slowly or fidgety/restless 0 0  0   Suicidal thoughts 0 0  0   PHQ-9 Score 0 2  1     Health Maintenance  Topic Date Due   HIV Screening  Never done   Hepatitis C  Screening  Never done   Zoster Vaccines- Shingrix (1 of 2) Never done   COVID-19 Vaccine (1 - 2024-25 season) Never done   INFLUENZA VACCINE  10/07/2023 (Originally 02/07/2023)   MAMMOGRAM  05/08/2025   DTaP/Tdap/Td (2 - Td or Tdap) 09/06/2025   Cervical Cancer Screening (HPV/Pap Cotest)  08/17/2026   Colonoscopy  05/19/2030   HPV VACCINES  Aged Out  Colonoscopy 05/19/20. Repeat 10 years. Hyperplastic polyps.  Mammogram 05/09/23.  Plans appt with gyn - pap 08/17/21 - neg HRHPV, negative pap.   Immunization History  Administered Date(s) Administered   Influenza Split 04/08/2017   Influenza-Unspecified 04/08/2014, 04/09/2015, 04/18/2016   Tdap 09/07/2015  PNA vaccine - declines Shingrix - declines.  Flu vaccine - declines.   No results found. Wears glasses - optho last year.   Dental: due - plans to schedule appt. 1 yr ago.   Alcohol: 3-5 drinks per week.   Tobacco: none.  No vaping.   Exercise: physical/active at work.    History Patient Active Problem List   Diagnosis Date Noted   Menorrhagia with irregular cycle 09/02/2021   Intramural uterine fibroid 09/02/2021   Iron deficiency anemia due to chronic blood loss 09/02/2021   Cervical myofascial pain syndrome 07/20/2020   Obesity (BMI 30.0-34.9) 08/22/2014   Microcytic anemia 09/03/2013   Headache 09/03/2013   HTN (hypertension) 08/28/2013   Past Medical History:  Diagnosis Date   Allergy    Hypertension    Sickle cell trait (HCC)    Tobacco abuse    Stopped smoking 08/24/13   Past Surgical History:  Procedure Laterality Date   BREAST BIOPSY Left 2011   HERNIA REPAIR  2013   TUBAL LIGATION     2003   Allergies  Allergen Reactions   Ace Inhibitors Swelling and Other (See Comments)    Reaction:  Tongue swelling    Lisinopril Swelling and Other (See Comments)    Reaction:  Tongue swelling    Prior to Admission medications   Medication Sig Start Date End Date Taking? Authorizing Provider  amLODipine (NORVASC) 10 MG tablet Take 1 tablet by mouth once daily 09/02/23  Yes Shade Flood, MD  blood glucose meter kit and supplies Dispense based on patient and insurance preference. Use once daily as directed. 02/09/22  Yes Shade Flood, MD  carvedilol (COREG) 25 MG tablet Take 1 tablet (25 mg total) by mouth 2 (two) times daily with a meal. 08/13/22  Yes Shade Flood, MD  cloNIDine (CATAPRES) 0.1 MG tablet Take 1 tablet (0.1 mg total) by mouth 2 (two) times daily. 08/13/22  Yes Shade Flood, MD  cyclobenzaprine (FLEXERIL) 5 MG tablet Take 1 tablet (5 mg total) by mouth 3 (three) times daily as needed for muscle spasms (start qhs prn due to sedation). 05/31/23  Yes Shade Flood, MD  meloxicam (MOBIC) 7.5 MG tablet Take 1 tablet (7.5 mg total) by mouth daily. 05/31/23  Yes Shade Flood, MD  potassium chloride SA (KLOR-CON) 20 MEQ tablet Take 2 tablets (40 mEq total) by mouth  daily. 12/07/20  Yes Shade Flood, MD   Social History   Socioeconomic History   Marital status: Single    Spouse name: Not on file   Number of children: Not on file   Years of education: Not on file   Highest education level: Not on file  Occupational History   Occupation: Estate agent  Tobacco Use   Smoking status: Former    Current  packs/day: 0.00    Types: Cigarettes    Start date: 08/24/1993    Quit date: 08/24/2013    Years since quitting: 10.0   Smokeless tobacco: Never  Vaping Use   Vaping status: Never Used  Substance and Sexual Activity   Alcohol use: Yes    Alcohol/week: 0.0 standard drinks of alcohol    Comment: occasional   Drug use: No   Sexual activity: Yes    Birth control/protection: Condom  Other Topics Concern   Not on file  Social History Narrative   Marital status: Single      Children: 2 children; no grandchildren.      Lives: with one child      Employment: Estate agent      Tobacco: quit 08/2013.       Alcohol:  Socially.      Exercise: Yes      Education: [employer].      Right handed   Caffeine: coffee in the winter, 1-2 cups/day   Social Drivers of Corporate investment banker Strain: Not on file  Food Insecurity: Not on file  Transportation Needs: Not on file  Physical Activity: Not on file  Stress: Not on file  Social Connections: Not on file  Intimate Partner Violence: Not on file    Review of Systems   Objective:   Vitals:   09/19/23 1443  BP: 118/66  Pulse: 75  Temp: 98.9 F (37.2 C)  TempSrc: Temporal  SpO2: 97%  Weight: 162 lb 6.4 oz (73.7 kg)  Height: 5' 4.75" (1.645 m)     Physical Exam     Assessment & Plan:  Kai Railsback is a 53 y.o. female . Annual physical exam  Essential hypertension  Bilateral low back pain without sciatica, unspecified chronicity  Screening for hyperlipidemia  Prediabetes  Screening for deficiency anemia  Screening for thyroid disorder   No orders  of the defined types were placed in this encounter.  There are no Patient Instructions on file for this visit.    Signed,   Meredith Staggers, MD [facility], [facility] 09/19/23 3:21 PM

## 2023-09-19 NOTE — Patient Instructions (Addendum)
 Blood pressure looks good today.  If you start experiencing lower readings at home we may be able to cut back on the clonidine.  Let me know and we can go through plan.  If any concerns on labs I will let you know.  Occasional use of meloxicam is fine for back pain but if you have continuous back pain or any worsening symptoms I would recommend meeting with a specialist.  Let me know.  Preventive Care 68-54 Years Old, Female Preventive care refers to lifestyle choices and visits with your health care provider that can promote health and wellness. Preventive care visits are also called wellness exams. What can I expect for my preventive care visit? Counseling Your health care provider may ask you questions about your: Medical history, including: Past medical problems. Family medical history. Pregnancy history. Current health, including: Menstrual cycle. Method of birth control. Emotional well-being. Home life and relationship well-being. Sexual activity and sexual health. Lifestyle, including: Alcohol, nicotine or tobacco, and drug use. Access to firearms. Diet, exercise, and sleep habits. Work and work Astronomer. Sunscreen use. Safety issues such as seatbelt and bike helmet use. Physical exam Your health care provider will check your: Height and weight. These may be used to calculate your BMI (body mass index). BMI is a measurement that tells if you are at a healthy weight. Waist circumference. This measures the distance around your waistline. This measurement also tells if you are at a healthy weight and may help predict your risk of certain diseases, such as type 2 diabetes and high blood pressure. Heart rate and blood pressure. Body temperature. Skin for abnormal spots. What immunizations do I need?  Vaccines are usually given at various ages, according to a schedule. Your health care provider will recommend vaccines for you based on your age, medical history, and lifestyle or  other factors, such as travel or where you work. What tests do I need? Screening Your health care provider may recommend screening tests for certain conditions. This may include: Lipid and cholesterol levels. Diabetes screening. This is done by checking your blood sugar (glucose) after you have not eaten for a while (fasting). Pelvic exam and Pap test. Hepatitis B test. Hepatitis C test. HIV (human immunodeficiency virus) test. STI (sexually transmitted infection) testing, if you are at risk. Lung cancer screening. Colorectal cancer screening. Mammogram. Talk with your health care provider about when you should start having regular mammograms. This may depend on whether you have a family history of breast cancer. BRCA-related cancer screening. This may be done if you have a family history of breast, ovarian, tubal, or peritoneal cancers. Bone density scan. This is done to screen for osteoporosis. Talk with your health care provider about your test results, treatment options, and if necessary, the need for more tests. Follow these instructions at home: Eating and drinking  Eat a diet that includes fresh fruits and vegetables, whole grains, lean protein, and low-fat dairy products. Take vitamin and mineral supplements as recommended by your health care provider. Do not drink alcohol if: Your health care provider tells you not to drink. You are pregnant, may be pregnant, or are planning to become pregnant. If you drink alcohol: Limit how much you have to 0-1 drink a day. Know how much alcohol is in your drink. In the [place] one drink equals one 12 oz bottle of beer (355 mL), one 5 oz glass of wine (148 mL), or one 1 oz glass of hard liquor (44 mL). Lifestyle Brush your  teeth every morning and night with fluoride toothpaste. Floss one time each day. Exercise for at least 30 minutes 5 or more days each week. Do not use any products that contain nicotine or tobacco. These products include  cigarettes, chewing tobacco, and vaping devices, such as e-cigarettes. If you need help quitting, ask your health care provider. Do not use drugs. If you are sexually active, practice safe sex. Use a condom or other form of protection to prevent STIs. If you do not wish to become pregnant, use a form of birth control. If you plan to become pregnant, see your health care provider for a prepregnancy visit. Take aspirin only as told by your health care provider. Make sure that you understand how much to take and what form to take. Work with your health care provider to find out whether it is safe and beneficial for you to take aspirin daily. Find healthy ways to manage stress, such as: Meditation, yoga, or listening to music. Journaling. Talking to a trusted person. Spending time with friends and family. Minimize exposure to UV radiation to reduce your risk of skin cancer. Safety Always wear your seat belt while driving or riding in a vehicle. Do not drive: If you have been drinking alcohol. Do not ride with someone who has been drinking. When you are tired or distracted. While texting. If you have been using any mind-altering substances or drugs. Wear a helmet and other protective equipment during sports activities. If you have firearms in your house, make sure you follow all gun safety procedures. Seek help if you have been physically or sexually abused. What's next? Visit your health care provider once a year for an annual wellness visit. Ask your health care provider how often you should have your eyes and teeth checked. Stay up to date on all vaccines. This information is not intended to replace advice given to you by your health care provider. Make sure you discuss any questions you have with your health care provider. Document Revised: 12/21/2020 Document Reviewed: 12/21/2020 Elsevier Patient Education  2024 [employer].

## 2023-09-20 LAB — COMPREHENSIVE METABOLIC PANEL
ALT: 13 U/L (ref 0–35)
AST: 19 U/L (ref 0–37)
Albumin: 4 g/dL (ref 3.5–5.2)
Alkaline Phosphatase: 63 U/L (ref 39–117)
BUN: 12 mg/dL (ref 6–23)
CO2: 22 meq/L (ref 19–32)
Calcium: 9.5 mg/dL (ref 8.4–10.5)
Chloride: 105 meq/L (ref 96–112)
Creatinine, Ser: 0.96 mg/dL (ref 0.40–1.20)
GFR: 67.47 mL/min (ref >60.00)
Glucose, Bld: 85 mg/dL (ref 70–99)
Potassium: 4.2 meq/L (ref 3.5–5.1)
Sodium: 135 meq/L (ref 135–145)
Total Bilirubin: 0.5 mg/dL (ref 0.2–1.2)
Total Protein: 7.8 g/dL (ref 6.0–8.3)

## 2023-09-20 LAB — LIPID PANEL
Cholesterol: 193 mg/dL (ref 0–200)
HDL: 91.7 mg/dL (ref >39.00)
LDL Cholesterol: 89 mg/dL (ref 0–99)
NonHDL: 101.07
Total CHOL/HDL Ratio: 2
Triglycerides: 62 mg/dL (ref 0.0–149.0)
VLDL: 12.4 mg/dL (ref 0.0–40.0)

## 2023-09-20 LAB — CBC WITH DIFFERENTIAL/PLATELET
Basophils Absolute: 0.1 10*3/uL (ref 0.0–0.1)
Basophils Relative: 1.8 % (ref 0.0–3.0)
Eosinophils Absolute: 0.2 10*3/uL (ref 0.0–0.7)
Eosinophils Relative: 4.1 % (ref 0.0–5.0)
HCT: 38.5 % (ref 36.0–46.0)
Hemoglobin: 12.8 g/dL (ref 12.0–15.0)
Lymphocytes Relative: 33.5 % (ref 12.0–46.0)
Lymphs Abs: 1.8 10*3/uL (ref 0.7–4.0)
MCHC: 33.2 g/dL (ref 30.0–36.0)
MCV: 81.3 fl (ref 78.0–100.0)
Monocytes Absolute: 0.8 10*3/uL (ref 0.1–1.0)
Monocytes Relative: 15.5 % — ABNORMAL HIGH (ref 3.0–12.0)
Neutro Abs: 2.4 10*3/uL (ref 1.4–7.7)
Neutrophils Relative %: 45.1 % (ref 43.0–77.0)
Platelets: 285 10*3/uL (ref 150.0–400.0)
RBC: 4.74 Mil/uL (ref 3.87–5.11)
RDW: 16.3 % — ABNORMAL HIGH (ref 11.5–15.5)
WBC: 5.3 10*3/uL (ref 4.0–10.5)

## 2023-09-20 LAB — TSH: TSH: 0.87 u[IU]/mL (ref 0.35–5.50)

## 2023-09-20 LAB — HEMOGLOBIN A1C: Hgb A1c MFr Bld: 5.8 % (ref 4.6–6.5)

## 2023-09-22 ENCOUNTER — Encounter: Payer: Self-pay | Admitting: Family Medicine

## 2023-09-30 ENCOUNTER — Ambulatory Visit: Payer: 59 | Admitting: Family Medicine

## 2024-02-01 ENCOUNTER — Other Ambulatory Visit: Payer: Self-pay

## 2024-02-01 ENCOUNTER — Emergency Department (HOSPITAL_COMMUNITY): Admission: EM | Admit: 2024-02-01 | Discharge: 2024-02-01 | Disposition: A | Discharge status: Home / Self Care

## 2024-02-01 DIAGNOSIS — S161XXA Strain of muscle, fascia and tendon at neck level, initial encounter: Secondary | ICD-10-CM | POA: Insufficient documentation

## 2024-02-01 DIAGNOSIS — S199XXA Unspecified injury of neck, initial encounter: Secondary | ICD-10-CM | POA: Diagnosis present

## 2024-02-01 DIAGNOSIS — X58XXXA Exposure to other specified factors, initial encounter: Secondary | ICD-10-CM | POA: Diagnosis not present

## 2024-02-01 DIAGNOSIS — M542 Cervicalgia: Secondary | ICD-10-CM

## 2024-02-01 DIAGNOSIS — Z79899 Other long term (current) drug therapy: Secondary | ICD-10-CM | POA: Diagnosis not present

## 2024-02-01 MED ORDER — LIDOCAINE 5 % EX PTCH: 1.0000 | MEDICATED_PATCH | CUTANEOUS | 0 refills | Status: AC

## 2024-02-01 MED ORDER — CYCLOBENZAPRINE HCL 10 MG PO TABS
5.0000 mg | ORAL_TABLET | Freq: Two times a day (BID) | ORAL | 0 refills | Status: AC | PRN
Start: 2024-02-01 — End: ?

## 2024-02-01 NOTE — Discharge Instructions (Signed)
 You can take Tylenol  for the pain.  You can take at 1000 mg or 1 g of Tylenol  every 6-8 hours.  Do not exceed more than 4 g of 4000 mg in a 24-hour period  You can use lidocaine  patches as well.  Places over the left side of your neck.  I did call in some Flexeril  for you as well.  Do not take this medication or drive.  Do not take this with any other medication that would make you sleepy.

## 2024-02-01 NOTE — ED Provider Notes (Signed)
 [place] [facility] AT [facility] Provider Note   CSN: [id number] Arrival date & time: 02/01/24  1222     Patient presents with: Neck Pain and Back Pain   Yolanda Jennings is a 53 y.o. female.    Neck Pain Back Pain   Presents because of left-sided neck pain.  Has been present over the past 2 days.  No trauma.  No fever no chills.  No pain with neck flexion.  No vision changes.  All the left side.  She has not take anything for pain.     Prior to Admission medications   Medication Sig Start Date End Date Taking? Authorizing Provider  cyclobenzaprine  (FLEXERIL ) 10 MG tablet Take 0.5 tablets (5 mg total) by mouth 2 (two) times daily as needed for muscle spasms. 02/01/24  Yes Simon Lavonia SAILOR, MD  lidocaine  (LIDODERM ) 5 % Place 1 patch onto the skin daily. Remove & Discard patch within 12 hours or as directed by MD 02/01/24  Yes Simon Lavonia SAILOR, MD  amLODipine  (NORVASC ) 10 MG tablet Take 1 tablet (10 mg total) by mouth daily. 09/19/23   Levora Reyes SAUNDERS, MD  blood glucose meter kit and supplies Dispense based on patient and insurance preference. Use once daily as directed. 02/09/22   Levora Reyes SAUNDERS, MD  carvedilol  (COREG ) 25 MG tablet Take 1 tablet (25 mg total) by mouth 2 (two) times daily with a meal. 09/19/23   Levora Reyes SAUNDERS, MD  cloNIDine  (CATAPRES ) 0.1 MG tablet Take 1 tablet (0.1 mg total) by mouth 2 (two) times daily. 09/19/23   Levora Reyes SAUNDERS, MD  meloxicam  (MOBIC ) 7.5 MG tablet Take 1 tablet (7.5 mg total) by mouth daily as needed for pain. 09/19/23   Levora Reyes SAUNDERS, MD  potassium chloride  SA (KLOR-CON  M) 20 MEQ tablet 2 tablets by mouth, 2 times per week. 09/19/23   Levora Reyes SAUNDERS, MD    Allergies: Ace inhibitors and Lisinopril     Review of Systems  Musculoskeletal:  Positive for back pain and neck pain.    Updated Vital Signs BP 124/83 (BP Location: Right Arm)   Pulse 71   Temp 98.3 F (36.8 C) (Oral)   Resp 16   SpO2 100%    Physical Exam Vitals and nursing note reviewed.  Constitutional:      General: She is not in acute distress.    Appearance: She is well-developed.  HENT:     Head: Normocephalic and atraumatic.  Eyes:     Conjunctiva/sclera: Conjunctivae normal.  Cardiovascular:     Rate and Rhythm: Normal rate and regular rhythm.     Heart sounds: No murmur heard. Pulmonary:     Effort: Pulmonary effort is normal. No respiratory distress.     Breath sounds: Normal breath sounds.  Abdominal:     Palpations: Abdomen is soft.     Tenderness: There is no abdominal tenderness.  Musculoskeletal:        General: No swelling.       Arms:     Cervical back: Neck supple.  Skin:    General: Skin is warm and dry.     Capillary Refill: Capillary refill takes less than 2 seconds.  Neurological:     Mental Status: She is alert.  Psychiatric:        Mood and Affect: Mood normal.     (all labs ordered are listed, but only abnormal results are displayed) Labs Reviewed - No data to display  EKG:  None  Radiology: No results found.   Procedures   Medications Ordered in the ED - No data to display                                  Medical Decision Making   Presents because of left-sided neck pain.  Has been present over the past 2 days.  No trauma.  No fever no chills.  No pain with neck flexion.  No vision changes.  All the left side.  She has not taken anything for pain.  Pain to palpation paravertebral cervical spine.  No midline tenderness.  No midline pain with flexion.  Negative Kernig's Brezinski's.  No fever no chills.  No concern for meningitis or other infectious etiology.  Likely MSK strain versus disc herniation.  Patient's neurovascular intact.  Think it is lower in the differential for any kind of disc herniation.  More likely just MSK strain given the distribution.  Strength and sensation intact in bilateral upper and lower extremities.  No concerns for any kind of central cord  syndrome.  Recommended Tylenol .  Take Flexeril  at home as well as lidocaine  patch.       Final diagnoses:  Neck pain  Strain of neck muscle, initial encounter    ED Discharge Orders          Ordered    cyclobenzaprine  (FLEXERIL ) 10 MG tablet  2 times daily PRN        02/01/24 1315    lidocaine  (LIDODERM ) 5 %  Every 24 hours        02/01/24 1315               Simon Lavonia SAILOR, MD 02/01/24 1316

## 2024-02-01 NOTE — ED Triage Notes (Signed)
 Pt has c/o left side head and neck , lower back pain for several days. Pt also states she has been having leg and feet swelling from standing long periods at her job.

## 2024-03-23 ENCOUNTER — Ambulatory Visit (INDEPENDENT_AMBULATORY_CARE_PROVIDER_SITE_OTHER): Admitting: Family Medicine

## 2024-03-23 VITALS — BP 130/78 | HR 72 | Temp 98.7°F | Ht 64.75 in | Wt 169.4 lb

## 2024-03-23 DIAGNOSIS — R7303 Prediabetes: Secondary | ICD-10-CM

## 2024-03-23 DIAGNOSIS — I10 Essential (primary) hypertension: Secondary | ICD-10-CM

## 2024-03-23 DIAGNOSIS — Z13220 Encounter for screening for lipoid disorders: Secondary | ICD-10-CM

## 2024-03-23 DIAGNOSIS — E876 Hypokalemia: Secondary | ICD-10-CM

## 2024-03-23 DIAGNOSIS — M545 Low back pain, unspecified: Secondary | ICD-10-CM

## 2024-03-23 NOTE — Progress Notes (Unsigned)
 Subjective:  Patient ID: Yolanda Jennings, female    DOB: [birth year]  Age: 53 y.o. MRN: [id number]  CC:  Chief Complaint  Patient presents with   Hypertension    Reports bp has been good   Back Pain    Comes and goes. No change    HPI Yolanda Jennings presents for   Hypertension: Amlodipine  10 mg daily, carvedilol  25 mg twice daily, clonidine  0.1 mg twice daily, potassium 20 mill equivalents - 2 pills once per week.  Home readings:not recent.  BP Readings from Last 3 Encounters:  03/23/24 130/78  02/01/24 125/77  09/19/23 118/66   Lab Results  Component Value Date   CREATININE 0.96 09/19/2023    Low back pain Intermittent symptoms in the past.  Prior imaging in December 2024 with mild straightening of lumbar lordosis but otherwise no acute osseous findings or significant spondylosis.  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  Intermittent use of meloxicam  noted at her March visit. Never saw PT. Time constraints. Declines at this time.  Mobic  - none needed in awhile.  Pain comes and goes, not limiting activities. Stretching daily. Mid low back. No bowel or bladder incontinence, no saddle anesthesia, no lower extremity weakness. Same pain.   Tx: lidocaine  patches, stretches.    Prediabetes: Dietary changes with weight loss at her March visit, down 6 pounds at that time, unfortunately weight has increased somewhat since March, up 7 pounds today. Exercise - at work - physical Cutting back on sweets.  Fast food - few times per week.  Lab Results  Component Value Date   HGBA1C 5.8 09/19/2023   Wt Readings from Last 3 Encounters:  03/23/24 169 lb 6.4 oz (76.8 kg)  09/19/23 162 lb 6.4 oz (73.7 kg)  07/01/23 168 lb 9.6 oz (76.5 kg)   Health maintenance Pneumonia vaccine, flu vaccine, shingles vaccine discussed. Also discussed HIV, hepatitis C screening. Declines all above.   History Patient Active Problem  List   Diagnosis Date Noted   Menorrhagia with irregular cycle 09/02/2021   Intramural uterine fibroid 09/02/2021   Iron deficiency anemia due to chronic blood loss 09/02/2021   Cervical myofascial pain syndrome 07/20/2020   Obesity (BMI 30.0-34.9) 08/22/2014   Microcytic anemia 09/03/2013   Headache 09/03/2013   HTN (hypertension) 08/28/2013   Past Medical History:  Diagnosis Date   Allergy    Hypertension    Sickle cell trait (HCC)    Tobacco abuse    Stopped smoking 08/24/13   Past Surgical History:  Procedure Laterality Date   BREAST BIOPSY Left 2011   HERNIA REPAIR  2013   TUBAL LIGATION     2003   Allergies  Allergen Reactions   Ace Inhibitors Swelling and Other (See Comments)    Reaction:  Tongue swelling    Lisinopril  Swelling and Other (See Comments)    Reaction:  Tongue swelling    Prior to Admission medications   Medication Sig Start Date End Date Taking? Authorizing Provider  amLODipine  (NORVASC ) 10 MG tablet Take 1 tablet (10 mg total) by mouth daily. 09/19/23  Yes Levora Reyes SAUNDERS, MD  blood glucose meter kit and supplies Dispense based on patient and insurance preference. Use once daily as directed. 02/09/22  Yes Levora Reyes SAUNDERS, MD  carvedilol  (COREG ) 25 MG tablet Take 1 tablet (25 mg total) by mouth 2 (two) times daily with a meal. 09/19/23  Yes Levora Reyes SAUNDERS, MD  cloNIDine  (CATAPRES ) 0.1 MG  tablet Take 1 tablet (0.1 mg total) by mouth 2 (two) times daily. 09/19/23  Yes Levora Reyes SAUNDERS, MD  cyclobenzaprine  (FLEXERIL ) 10 MG tablet Take 0.5 tablets (5 mg total) by mouth 2 (two) times daily as needed for muscle spasms. 02/01/24  Yes Simon Lavonia SAILOR, MD  lidocaine  (LIDODERM ) 5 % Place 1 patch onto the skin daily. Remove & Discard patch within 12 hours or as directed by MD Patient taking differently: Place 1 patch onto the skin daily. Remove & Discard patch within 12 hours or as directed by MD As needed 02/01/24  Yes Lemly, Tatum N, MD  meloxicam  (MOBIC ) 7.5 MG  tablet Take 1 tablet (7.5 mg total) by mouth daily as needed for pain. 09/19/23  Yes Levora Reyes SAUNDERS, MD  potassium chloride  SA (KLOR-CON  M) 20 MEQ tablet 2 tablets by mouth, 2 times per week. 09/19/23  Yes Levora Reyes SAUNDERS, MD   Social History   Socioeconomic History   Marital status: Single    Spouse name: Not on file   Number of children: Not on file   Years of education: Not on file   Highest education level: Not on file  Occupational History   Occupation: Estate agent  Tobacco Use   Smoking status: Former    Current packs/day: 0.00    Types: Cigarettes    Start date: 08/24/1993    Quit date: 08/24/2013    Years since quitting: 10.5   Smokeless tobacco: Never  Vaping Use   Vaping status: Never Used  Substance and Sexual Activity   Alcohol use: Yes    Alcohol/week: 0.0 standard drinks of alcohol    Comment: occasional   Drug use: No   Sexual activity: Yes    Birth control/protection: Condom  Other Topics Concern   Not on file  Social History Narrative   Marital status: Single      Children: 2 children; no grandchildren.      Lives: with one child      Employment: Estate agent      Tobacco: quit 08/2013.       Alcohol:  Socially.      Exercise: Yes      Education: [employer].      Right handed   Caffeine : coffee in the winter, 1-2 cups/day   Social Drivers of Corporate investment banker Strain: Not on file  Food Insecurity: Not on file  Transportation Needs: Not on file  Physical Activity: Not on file  Stress: Not on file  Social Connections: Not on file  Intimate Partner Violence: Not on file    Review of Systems  Constitutional:  Negative for fatigue and unexpected weight change.  Respiratory:  Negative for chest tightness and shortness of breath.   Cardiovascular:  Negative for chest pain, palpitations and leg swelling.  Gastrointestinal:  Negative for abdominal pain and blood in stool.  Neurological:  Negative for dizziness, syncope,  light-headedness and headaches.     Objective:   Vitals:   03/23/24 1455 03/23/24 1557  BP: (!) 146/88 130/78  Pulse: 72   Temp: 98.7 F (37.1 C)   TempSrc: Temporal   SpO2: 100%   Weight: 169 lb 6.4 oz (76.8 kg)   Height: 5' 4.75 (1.645 m)      Physical Exam Vitals reviewed.  Constitutional:      Appearance: Normal appearance. She is well-developed.  HENT:     Head: Normocephalic and atraumatic.  Eyes:     Conjunctiva/sclera: Conjunctivae normal.  Pupils: Pupils are equal, round, and reactive to light.  Neck:     Vascular: No carotid bruit.  Cardiovascular:     Rate and Rhythm: Normal rate and regular rhythm.     Heart sounds: Normal heart sounds.  Pulmonary:     Effort: Pulmonary effort is normal.     Breath sounds: Normal breath sounds.  Abdominal:     Palpations: Abdomen is soft. There is no pulsatile mass.     Tenderness: There is no abdominal tenderness.  Musculoskeletal:     Right lower leg: No edema.     Left lower leg: No edema.     Comments: LS spine: No midline bony ttp, negative seated SLR, ambulating without assistive device.   Skin:    General: Skin is warm and dry.  Neurological:     Mental Status: She is alert and oriented to person, place, and time.  Psychiatric:        Mood and Affect: Mood normal.        Behavior: Behavior normal.     Assessment & Plan:  Yolanda Jennings is a 53 y.o. female . Essential hypertension - Plan: Comprehensive metabolic panel with GFR  Bilateral low back pain without sciatica, unspecified chronicity  Prediabetes - Plan: Comprehensive metabolic panel with GFR, Hemoglobin A1c  Hypokalemia - Plan: Comprehensive metabolic panel with GFR  Screening for hyperlipidemia - Plan: Lipid panel   No orders of the defined types were placed in this encounter.  Patient Instructions  I do recommend physical therapy or ortho for your back pain. Let me know if you change your mind and I can order a referral.  See  information below on managing symptoms at home in the meantime.  No change in meds at this time, if any concerns on labs I will let you know.  Thank you for coming in today!     Managing Chronic Back Pain Chronic back pain is pain that lasts longer than 3 months. It often affects the lower back. It may feel like a muscle ache or a sharp, stabbing pain. It can be mild, moderate, or severe. There are things you can do to help manage your pain. See what works best for you. Your health care provider may also give you other instructions. What actions can I take to manage my chronic back pain? You may be given a treatment plan by your provider. Treatment often starts with rest and pain relief. It may also include: Physical therapy. These are exercises to help restore movement and strength to your back. Techniques to help you relax. Counseling or therapy. Cognitive behavioral therapy (CBT) is a form of therapy that helps you set goals and make changes. Acupuncture or massage therapy. Local electrical stimulation. Injections. You may be given medicines to numb an area or relieve pain. If other treatments do not help, you may need surgery. How to use body mechanics and posture to help with pain You can help relieve stress on your back with good posture and healthy body mechanics. Body mechanics are all the ways your body moves during the day. Posture is part of body mechanics. Good posture means: Your spine is in its correct S-curve, or neutral, position. Your shoulders are pulled back a bit. Your head is not tipped forward. To improve your posture and body mechanics, follow these guidelines. Standing  When standing, keep your feet about hip-width apart. Keep your knees slightly bent. Your ears, shoulders, and hips should line up. Your spine  should be neutral. When you stand in one place for a long time, place one foot on a stable object that is 2-4 inches (5-10 cm) high, such as a  footstool. Sitting  When sitting, keep your feet flat on the floor. Use a footrest, if needed. Keep your thighs parallel to the floor. Try not to round your shoulders or tilt your head forward. When working at a desk or a computer: Position your desk so your hands are a little lower than your elbows. Slide your chair under your desk so you are close enough to have good posture. Position your monitor so you are looking straight ahead and do not have to tilt your head to view the screen. Lifting  Keep your feet shoulder-width apart. Tighten the muscles of your abdomen. Bend your knees and hips. Keep your spine neutral. Lift using the strength of your legs, not your back. Do not lock your knees straight out. Ask for help to lift heavy or awkward objects. Resting  Do not lie down in a way that causes pain. If you have pain when you sit, bend, stoop, or squat, lie in a way that your body does not bend much. Try not to curl up on your side with your arms and knees near your chest (fetal position). If it hurts to stand for a long time or reach with your arms, lie with your spine neutral and knees bent slightly. Try lying: On your side with a pillow between your knees. On your back with a pillow under your knees. How to recognize changes in your chronic back pain Let your provider know if your pain gets worse or does not get better with treatment. Your back pain may be getting worse if you have pain that: Starts to cause problems with your posture. Gets worse when you sit, stand, walk, bend, or lift things. Happens when you are active, at rest, or both. Makes it hard for you to move around (limits mobility). Occurs with fever, weight loss, or trouble peeing (urinating). Causes numbness and tingling. Follow these instructions at home: Medicines You may need to take medicines for pain and inflammation. These may be taken by mouth or put on the skin. You may also be given muscle relaxants. Take  over-the-counter and prescription medicines only as told by your provider. Ask your provider if the medicine prescribed to you: Requires you to avoid driving or using machinery. Can cause constipation. You may need to take these actions to prevent or treat constipation: Drink enough fluid to keep your pee (urine) pale yellow. Take over-the-counter or prescription medicines. Eat foods that are high in fiber, such as beans, whole grains, and fresh fruits and vegetables. Limit foods that are high in fat and processed sugars, such as fried or sweet foods. Lifestyle Do not use any products that contain nicotine or tobacco. These products include cigarettes, chewing tobacco, and vaping devices, such as e-cigarettes. If you need help quitting, ask your provider. Eat a healthy diet. Eat lots of vegetables, fruits, fish, and lean meats. Work with your provider to stay at a healthy weight. General instructions Get regular exercise as told. Exercise can help with flexibility and strength. If physical therapy was prescribed, do exercises as told by your provider. Use ice or heat therapy as told by your provider. Where can I get support? Think about joining a support group for people with chronic back pain. You can find some groups at: Pain Connection Program: painconnection.org The American Chronic Pain  Association: acpanow.com Contact a health care provider if: Your pain does not get better with rest or medicine. You have new pain. You have a fever. You lose weight quickly. You have trouble doing your normal activities. You feel weak or numb in one or both of your legs or feet. Get help right away if: You are not able to control when you pee or poop. You have severe back pain and: Nausea or vomiting. Pain in your chest or abdomen. Shortness of breath. You faint. These symptoms may be an emergency. Get help right away. Call 911. Do not wait to see if the symptoms will go away. Do not drive  yourself to the hospital. This information is not intended to replace advice given to you by your health care provider. Make sure you discuss any questions you have with your health care provider. Document Revised: 02/12/2022 Document Reviewed: 02/12/2022 Elsevier Patient Education  2024 Elsevier Inc.    Signed,   Reyes Pines, MD [facility], [facility] 03/23/24 3:59 PM

## 2024-03-23 NOTE — Patient Instructions (Addendum)
 I do recommend physical therapy or ortho for your back pain. Let me know if you change your mind and I can order a referral.  See information below on managing symptoms at home in the meantime.  No change in meds at this time, if any concerns on labs I will let you know.  Thank you for coming in today!     Managing Chronic Back Pain Chronic back pain is pain that lasts longer than 3 months. It often affects the lower back. It may feel like a muscle ache or a sharp, stabbing pain. It can be mild, moderate, or severe. There are things you can do to help manage your pain. See what works best for you. Your health care provider may also give you other instructions. What actions can I take to manage my chronic back pain? You may be given a treatment plan by your provider. Treatment often starts with rest and pain relief. It may also include: Physical therapy. These are exercises to help restore movement and strength to your back. Techniques to help you relax. Counseling or therapy. Cognitive behavioral therapy (CBT) is a form of therapy that helps you set goals and make changes. Acupuncture or massage therapy. Local electrical stimulation. Injections. You may be given medicines to numb an area or relieve pain. If other treatments do not help, you may need surgery. How to use body mechanics and posture to help with pain You can help relieve stress on your back with good posture and healthy body mechanics. Body mechanics are all the ways your body moves during the day. Posture is part of body mechanics. Good posture means: Your spine is in its correct S-curve, or neutral, position. Your shoulders are pulled back a bit. Your head is not tipped forward. To improve your posture and body mechanics, follow these guidelines. Standing  When standing, keep your feet about hip-width apart. Keep your knees slightly bent. Your ears, shoulders, and hips should line up. Your spine should be neutral. When you  stand in one place for a long time, place one foot on a stable object that is 2-4 inches (5-10 cm) high, such as a footstool. Sitting  When sitting, keep your feet flat on the floor. Use a footrest, if needed. Keep your thighs parallel to the floor. Try not to round your shoulders or tilt your head forward. When working at a desk or a computer: Position your desk so your hands are a little lower than your elbows. Slide your chair under your desk so you are close enough to have good posture. Position your monitor so you are looking straight ahead and do not have to tilt your head to view the screen. Lifting  Keep your feet shoulder-width apart. Tighten the muscles of your abdomen. Bend your knees and hips. Keep your spine neutral. Lift using the strength of your legs, not your back. Do not lock your knees straight out. Ask for help to lift heavy or awkward objects. Resting  Do not lie down in a way that causes pain. If you have pain when you sit, bend, stoop, or squat, lie in a way that your body does not bend much. Try not to curl up on your side with your arms and knees near your chest (fetal position). If it hurts to stand for a long time or reach with your arms, lie with your spine neutral and knees bent slightly. Try lying: On your side with a pillow between your knees. On your back  with a pillow under your knees. How to recognize changes in your chronic back pain Let your provider know if your pain gets worse or does not get better with treatment. Your back pain may be getting worse if you have pain that: Starts to cause problems with your posture. Gets worse when you sit, stand, walk, bend, or lift things. Happens when you are active, at rest, or both. Makes it hard for you to move around (limits mobility). Occurs with fever, weight loss, or trouble peeing (urinating). Causes numbness and tingling. Follow these instructions at home: Medicines You may need to take medicines for  pain and inflammation. These may be taken by mouth or put on the skin. You may also be given muscle relaxants. Take over-the-counter and prescription medicines only as told by your provider. Ask your provider if the medicine prescribed to you: Requires you to avoid driving or using machinery. Can cause constipation. You may need to take these actions to prevent or treat constipation: Drink enough fluid to keep your pee (urine) pale yellow. Take over-the-counter or prescription medicines. Eat foods that are high in fiber, such as beans, whole grains, and fresh fruits and vegetables. Limit foods that are high in fat and processed sugars, such as fried or sweet foods. Lifestyle Do not use any products that contain nicotine or tobacco. These products include cigarettes, chewing tobacco, and vaping devices, such as e-cigarettes. If you need help quitting, ask your provider. Eat a healthy diet. Eat lots of vegetables, fruits, fish, and lean meats. Work with your provider to stay at a healthy weight. General instructions Get regular exercise as told. Exercise can help with flexibility and strength. If physical therapy was prescribed, do exercises as told by your provider. Use ice or heat therapy as told by your provider. Where can I get support? Think about joining a support group for people with chronic back pain. You can find some groups at: Pain Connection Program: painconnection.org The American Chronic Pain Association: acpanow.com Contact a health care provider if: Your pain does not get better with rest or medicine. You have new pain. You have a fever. You lose weight quickly. You have trouble doing your normal activities. You feel weak or numb in one or both of your legs or feet. Get help right away if: You are not able to control when you pee or poop. You have severe back pain and: Nausea or vomiting. Pain in your chest or abdomen. Shortness of breath. You faint. These symptoms  may be an emergency. Get help right away. Call 911. Do not wait to see if the symptoms will go away. Do not drive yourself to the hospital. This information is not intended to replace advice given to you by your health care provider. Make sure you discuss any questions you have with your health care provider. Document Revised: 02/12/2022 Document Reviewed: 02/12/2022 Elsevier Patient Education  2024 [employer].

## 2024-03-24 LAB — COMPREHENSIVE METABOLIC PANEL WITH GFR
ALT: 11 U/L (ref 0–35)
AST: 18 U/L (ref 0–37)
Albumin: 3.7 g/dL (ref 3.5–5.2)
Alkaline Phosphatase: 66 U/L (ref 39–117)
BUN: 12 mg/dL (ref 6–23)
CO2: 25 meq/L (ref 19–32)
Calcium: 9 mg/dL (ref 8.4–10.5)
Chloride: 104 meq/L (ref 96–112)
Creatinine, Ser: 0.84 mg/dL (ref 0.40–1.20)
GFR: 78.91 mL/min (ref >60.00)
Glucose, Bld: 88 mg/dL (ref 70–99)
Potassium: 4 meq/L (ref 3.5–5.1)
Sodium: 137 meq/L (ref 135–145)
Total Bilirubin: 0.5 mg/dL (ref 0.2–1.2)
Total Protein: 7.3 g/dL (ref 6.0–8.3)

## 2024-03-24 LAB — LIPID PANEL
Cholesterol: 178 mg/dL (ref 0–200)
HDL: 87.5 mg/dL (ref >39.00)
LDL Cholesterol: 81 mg/dL (ref 0–99)
NonHDL: 90.63
Total CHOL/HDL Ratio: 2
Triglycerides: 49 mg/dL (ref 0.0–149.0)
VLDL: 9.8 mg/dL (ref 0.0–40.0)

## 2024-03-24 LAB — HEMOGLOBIN A1C: Hgb A1c MFr Bld: 5.8 % (ref 4.6–6.5)

## 2024-03-27 ENCOUNTER — Ambulatory Visit: Payer: Self-pay | Admitting: Family Medicine

## 2024-04-02 NOTE — Progress Notes (Signed)
 Called patient to relay lab results. Left vm to return call

## 2024-09-21 ENCOUNTER — Encounter: Admitting: Family Medicine
